# Patient Record
Sex: Male | Born: 1950 | Race: White | Hispanic: No | Marital: Married | Smoking: Former smoker
Health system: Southern US, Community
[De-identification: ages and names within clinical notes are randomized; demographics above are authoritative.]

## PROBLEM LIST (undated history)

## (undated) DIAGNOSIS — F419 Anxiety disorder, unspecified: Secondary | ICD-10-CM

## (undated) DIAGNOSIS — K589 Irritable bowel syndrome without diarrhea: Secondary | ICD-10-CM

## (undated) DIAGNOSIS — I493 Ventricular premature depolarization: Secondary | ICD-10-CM

## (undated) DIAGNOSIS — F32A Depression, unspecified: Secondary | ICD-10-CM

## (undated) DIAGNOSIS — E7800 Pure hypercholesterolemia, unspecified: Secondary | ICD-10-CM

## (undated) DIAGNOSIS — F1911 Other psychoactive substance abuse, in remission: Secondary | ICD-10-CM

## (undated) DIAGNOSIS — I10 Essential (primary) hypertension: Secondary | ICD-10-CM

## (undated) DIAGNOSIS — Z8719 Personal history of other diseases of the digestive system: Secondary | ICD-10-CM

## (undated) DIAGNOSIS — Z87442 Personal history of urinary calculi: Secondary | ICD-10-CM

## (undated) DIAGNOSIS — K219 Gastro-esophageal reflux disease without esophagitis: Secondary | ICD-10-CM

## (undated) DIAGNOSIS — E876 Hypokalemia: Secondary | ICD-10-CM

## (undated) DIAGNOSIS — Z720 Tobacco use: Secondary | ICD-10-CM

## (undated) DIAGNOSIS — N400 Enlarged prostate without lower urinary tract symptoms: Secondary | ICD-10-CM

## (undated) DIAGNOSIS — F329 Major depressive disorder, single episode, unspecified: Secondary | ICD-10-CM

## (undated) DIAGNOSIS — R319 Hematuria, unspecified: Secondary | ICD-10-CM

## (undated) DIAGNOSIS — I2510 Atherosclerotic heart disease of native coronary artery without angina pectoris: Secondary | ICD-10-CM

## (undated) DIAGNOSIS — Z8701 Personal history of pneumonia (recurrent): Secondary | ICD-10-CM

## (undated) DIAGNOSIS — R7303 Prediabetes: Secondary | ICD-10-CM

## (undated) HISTORY — DX: Tobacco use: Z72.0

## (undated) HISTORY — DX: Hematuria, unspecified: R31.9

## (undated) HISTORY — DX: Anxiety disorder, unspecified: F41.9

## (undated) HISTORY — DX: Benign prostatic hyperplasia without lower urinary tract symptoms: N40.0

## (undated) HISTORY — DX: Hypokalemia: E87.6

## (undated) HISTORY — DX: Major depressive disorder, single episode, unspecified: F32.9

## (undated) HISTORY — DX: Depression, unspecified: F32.A

## (undated) HISTORY — PX: CHOLECYSTECTOMY OPEN: SUR202

## (undated) HISTORY — PX: TONSILLECTOMY: SUR1361

## (undated) HISTORY — DX: Ventricular premature depolarization: I49.3

---

## 1999-01-13 ENCOUNTER — Ambulatory Visit (HOSPITAL_COMMUNITY): Admission: RE | Admit: 1999-01-13 | Discharge: 1999-01-13 | Payer: Self-pay | Admitting: Family Medicine

## 1999-01-13 ENCOUNTER — Encounter: Payer: Self-pay | Admitting: Family Medicine

## 2000-12-02 ENCOUNTER — Ambulatory Visit (HOSPITAL_COMMUNITY): Admission: RE | Admit: 2000-12-02 | Discharge: 2000-12-02 | Payer: Self-pay | Admitting: Family Medicine

## 2000-12-02 ENCOUNTER — Encounter: Payer: Self-pay | Admitting: Family Medicine

## 2007-10-02 ENCOUNTER — Observation Stay (HOSPITAL_COMMUNITY): Admission: EM | Admit: 2007-10-02 | Discharge: 2007-10-03 | Payer: Self-pay | Admitting: Emergency Medicine

## 2011-04-10 NOTE — H&P (Signed)
NAMERIHAAN, Joseph Fitzpatrick             ACCOUNT NO.:  [id number]   MEDICAL RECORD NO.:  [id number]          PATIENT TYPE:  OBV   LOCATION:  4735                         FACILITY:  [facility]   PHYSICIAN:  Kela Millin, M.D.DATE OF BIRTH:  Sep 16, 1951   DATE OF ADMISSION:  10/02/2007  DATE OF DISCHARGE:                              HISTORY & PHYSICAL   PRIMARY CARE PHYSICIAN:  Dr. Elias Else   CHIEF COMPLAINT:  Dizziness, chest pain.   HISTORY OF PRESENT ILLNESS:  The patient is a 60 year old white male  with past medical history significant for marijuana abuse,  anxiety/depression and also history of panic attacks, tobacco abuse who  presents with the above complaints.  He states that he has been having  chest discomfort on and off for a long time.  He describes this as a  twinge, of minor severity, left precordial in location, no associated  shortness of breath, no radiation, no diaphoresis, also no associated  nausea or vomiting.  He states that a long time ago he had had similar  symptoms and went to [facility] ER and was diagnosed with indigestion  at that time.  He states that he had been started on Lamictal some time  ago as a mood stabilizer and he noticed right after he started taking it  that he was more aware of his heart beat.  Denies any racing of his  heart.  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  He denies any focal weakness, no slurred speech, no dysphagia.  He also  admits to daily marijuana use for the most part and states that this  dizziness felt a little bit different from the dizziness that he usually  feels after using marijuana.  The patient admits to having had a stress  test done by Dr. Verdis Prime some 10-15 years ago just because his dad  had had angioplasties in his 67s, and at that time his  stress test was  reported to be within normal limits.  The patient denies fevers, cough,  PND, lower extremity edema.  In the ER his initial blood pressure was  noted to be elevated at 169/102 and the diastolic improved on recheck to  172/96, his point of care markers were negative, and EKG showed PACs and  nonspecific lateral T-wave abnormalities.  His urine drug screen was  positive for marijuana.  He is admitted for further evaluation and  management.   PAST MEDICAL HISTORY:  As above.   MEDICATIONS:  1. Xanax 0.5 mg p.o. b.i.d., up to three times a day.  2. Pristiq one tablet daily.  3. He states that his Lamictal was discontinued on Tuesday.   ALLERGIES:  No known drug allergies.   SOCIAL HISTORY:  Positive for tobacco, less than a pack per day.  He  quit alcohol 25 years ago.  Positive for marijuana.   FAMILY HISTORY:  His dad had angioplasties in  his 59s and died at age  52.  His mother had Alzheimer's.  Both parents had hypertension.   REVIEW OF SYSTEMS:  As per HPI, other review of systems negative.   PHYSICAL EXAMINATION:  GENERAL:  The patient is a middle-aged white  male, well-developed, well-nourished, in no apparent distress.  VITAL SIGNS:  His temperature is 99.3, his blood pressure is 172/96,  initially 196/102, his pulse is 78, his respiratory rate is 16, O2  saturation of 94%.  HEENT:  PERRL, EOMI, sclerae anicteric, moist mucous membranes and no  oral exudates.  LUNGS:  Clear to auscultation bilaterally.  No crackles or wheezes.  CARDIOVASCULAR:  Regular rate and rhythm.  Normal S1, S2.  ABDOMEN:  Soft, bowel sounds present, nontender, nondistended.  No  organomegaly and no masses palpable.  EXTREMITIES:  No cyanosis and no edema.  NEUROLOGIC:  He is alert and oriented x3.  Cranial nerves II-XII grossly  intact.  Strength is 5/5 and symmetric.  No facial asymmetry.  Nonfocal  exam.   LABORATORY DATA:  His point-of-care markers negative x1.  EKG as per   HPI.  His white cell count is 6.1, hemoglobin of 14.4, hematocrit 42,  platelet count of 239, neutrophil count 60.  His sodium is 141 with a  potassium of 3.4, chloride is 106, BUN is 7, glucose is 96, pH is 7.40  and his pCO2 is 43.6.  His urine drug screen is positive for marijuana  and benzodiazepines.   ASSESSMENT AND PLAN:  1. Chest pain:  As discussed above, the patient reports that he was      prompted to come to the ER because of dizziness while standing last      p.m..  It is also noted that his blood pressure was markedly      elevated initially and on recheck still uncontrolled.  Although the      patient has no prior history of hypertension, could be a cause of      his chest pain as well.  It is also noted that the patient uses      marijuana almost daily.  Will obtain serial cardiac enzymes,      fasting lipid profile in the a.m., monitor on telemetry.  A D-dimer      has also been ordered.  Will follow and consider further evaluation      pending results.  If above tests negative will consider discharge      for outpatient followup and treating the patient's blood pressures.  2. Uncontrolled hypertension:  As discussed above, no prior      hypertension.  Will start on beta blockers at this time given that      he is having some chest pain as noted above, as well as multiple      PVCs noted on EKG.  3. Hypokalemia:  Replace potassium.  4. Marijuana abuse:  The patient counseled to quit.  Will also consult      social worker for resources to quit.  5. Tobacco abuse:  Counseled to quite, smoking cessation consult.  6. Anxiety/depression:  Continue outpatient medications.      Kela Millin, M.D.  Electronically Signed     ACV/MEDQ  D:  10/02/2007  T:  10/03/2007  Job:  [id number]   cc:   Joseph Fitzpatrick, M.D.

## 2011-09-04 LAB — CARDIAC PANEL(CRET KIN+CKTOT+MB+TROPI)
CK, MB: 1
CK, MB: 1.2
Relative Index: INVALID
Troponin I: 0.02

## 2011-09-04 LAB — I-STAT 8, (EC8 V) (CONVERTED LAB)
Acid-Base Excess: 2
Bicarbonate: 27.5 — ABNORMAL HIGH
Potassium: 3.4 — ABNORMAL LOW
TCO2: 29
pCO2, Ven: 43.6 — ABNORMAL LOW
pH, Ven: 7.408 — ABNORMAL HIGH

## 2011-09-04 LAB — BASIC METABOLIC PANEL
Chloride: 103
GFR calc Af Amer: >60
GFR calc non Af Amer: >60
Potassium: 3.5
Sodium: 139

## 2011-09-04 LAB — RAPID URINE DRUG SCREEN, HOSP PERFORMED
Amphetamines: NOT DETECTED
Benzodiazepines: POSITIVE — AB
Tetrahydrocannabinol: POSITIVE — AB

## 2011-09-04 LAB — POCT CARDIAC MARKERS
CKMB, poc: <1 — ABNORMAL LOW
Myoglobin, poc: 59.7
Operator id: 288831
Troponin i, poc: <0.05

## 2011-09-04 LAB — CBC
HCT: 42
Hemoglobin: 14.4
MCHC: 34.2
MCV: 94.8
Platelets: 239
RBC: 4.43
RDW: 13.2
WBC: 6.1

## 2011-09-04 LAB — DIFFERENTIAL
Basophils Absolute: 0.1
Basophils Relative: 1
Eosinophils Absolute: 0
Eosinophils Relative: 1
Lymphocytes Relative: 32
Lymphs Abs: 2
Monocytes Absolute: 0.4
Monocytes Relative: 7
Neutro Abs: 3.6
Neutrophils Relative %: 60

## 2011-09-04 LAB — LIPID PANEL: Cholesterol: 192

## 2011-09-04 LAB — POCT I-STAT CREATININE
Creatinine, Ser: 0.9
Operator id: 288831

## 2011-09-04 LAB — CK TOTAL AND CKMB (NOT AT ARMC): Relative Index: INVALID

## 2011-09-04 LAB — D-DIMER, QUANTITATIVE: D-Dimer, Quant: 0.22

## 2012-06-17 ENCOUNTER — Ambulatory Visit (INDEPENDENT_AMBULATORY_CARE_PROVIDER_SITE_OTHER): Payer: 59 | Admitting: Surgery

## 2012-07-03 ENCOUNTER — Encounter (INDEPENDENT_AMBULATORY_CARE_PROVIDER_SITE_OTHER): Payer: Self-pay

## 2012-07-09 ENCOUNTER — Encounter (INDEPENDENT_AMBULATORY_CARE_PROVIDER_SITE_OTHER): Payer: Self-pay | Admitting: General Surgery

## 2012-07-09 ENCOUNTER — Ambulatory Visit (INDEPENDENT_AMBULATORY_CARE_PROVIDER_SITE_OTHER): Payer: 59 | Admitting: General Surgery

## 2012-07-09 VITALS — BP 116/84 | HR 58 | Temp 97.2°F | Resp 12 | Ht 70.75 in | Wt 179.6 lb

## 2012-07-09 DIAGNOSIS — L723 Sebaceous cyst: Secondary | ICD-10-CM

## 2012-07-09 NOTE — Patient Instructions (Addendum)
Call [phone] and ask for scheduling 

## 2012-07-09 NOTE — Progress Notes (Signed)
Patient ID: Joseph Fitzpatrick, male   DOB: 1951-02-13, 61 y.o.   MRN: [id number]  Chief Complaint  Patient presents with  . Pre-op Exam    eval seb cyst on back    HPI Joseph Fitzpatrick is a 61 y.o. male.  Chief complaint: Cyst of the back HPI Patient has a several month history of a cyst in his upper mid back. It intermittently drains some material that has an odor. It is become more symptomatic recently. He was seen for his routine physical by Dr. Zachery Dauer. She asked [place] to see him in consultation to consider removal. He also complains of intermittent pain in the area. He's otherwise been in his usual state of health.  Past Medical History  Diagnosis Date  . Hypertension   . Tobacco abuse   . PVCs (premature ventricular contractions)   . Anxiety   . Depression   . BPH (benign prostatic hypertrophy)   . Hypokalemia   . Hematuria   . Substance abuse     Past Surgical History  Procedure Date  . Cholecystectomy     Family History  Problem Relation Age of Onset  . Alzheimer's disease Mother     Social History History  Substance Use Topics  . Smoking status: Current Everyday Smoker -- 0.5 packs/day for .5 years    Types: Cigarettes  . Smokeless tobacco: Not on file  . Alcohol Use: No    No Known Allergies  Current Outpatient Prescriptions  Medication Sig Dispense Refill  . ALPRAZolam (XANAX) 1 MG tablet Take 1 mg by mouth 3 (three) times daily as needed.      . ARIPiprazole (ABILIFY) 5 MG tablet Take 2.5 mg by mouth daily.      Marland Kitchen losartan-hydrochlorothiazide (HYZAAR) 50-12.5 MG per tablet Take 1 tablet by mouth daily.      . metoprolol tartrate (LOPRESSOR) 25 MG tablet Take 25 mg by mouth daily.      Marland Kitchen PARoxetine (PAXIL) 20 MG tablet Take 20 mg by mouth every morning.      . simvastatin (ZOCOR) 40 MG tablet Take 40 mg by mouth every evening.        Review of Systems Review of Systems  Constitutional: Negative for fever, chills and unexpected weight change.  HENT:  Negative for hearing loss, congestion, sore throat, trouble swallowing and voice change.   Eyes: Negative for visual disturbance.  Respiratory: Negative for cough and wheezing.   Cardiovascular: Negative for chest pain, palpitations and leg swelling.  Gastrointestinal: Negative for nausea, vomiting, abdominal pain, diarrhea, constipation, blood in stool, abdominal distention, anal bleeding and rectal pain.  Genitourinary: Negative for hematuria and difficulty urinating.  Musculoskeletal: Negative for arthralgias.       See history of present illness  Skin: Negative for rash and wound.  Neurological: Negative for seizures, syncope, weakness and headaches.  Hematological: Negative for adenopathy. Does not bruise/bleed easily.  Psychiatric/Behavioral: Negative for confusion.       History of anxiety and depression    Blood pressure 116/84, pulse 58, temperature 97.2 F (36.2 C), temperature source Temporal, resp. rate 12, height 5' 10.75" (1.797 m), weight 179 lb 9.6 oz (81.466 kg), SpO2 93.00%.  Physical Exam Physical Exam  Constitutional: He is oriented to person, place, and time. He appears well-developed and well-nourished.  HENT:  Head: Normocephalic and atraumatic.  Mouth/Throat: No oropharyngeal exudate.  Eyes: EOM are normal. Pupils are equal, round, and reactive to light. No scleral icterus.  Neck: Normal range of  motion. Neck supple. No tracheal deviation present.  Cardiovascular: Normal rate, normal heart sounds and intact distal pulses.   Pulmonary/Chest: Effort normal and breath sounds normal. No stridor. No respiratory distress. He has no wheezes. He has no rales.  Abdominal: Soft. He exhibits no distension. There is no tenderness. There is no rebound and no guarding.       Scar right upper quadrant  Musculoskeletal: Normal range of motion.       Arms:      Sebaceous cyst with a scab upper back along the midline  Lymphadenopathy:    He has no cervical adenopathy.    Neurological: He is alert and oriented to person, place, and time.  Skin:       see musculoskeletal  Psychiatric:       Flat affect    Data Reviewed Office notes  Assessment    Sebaceous cyst upper back    Plan    I've offered excision under general anesthesia. Procedure, risks, and benefits were discussed in detail with the patient. I answered his questions. He needs to check his schedule with his wife. He will then call back to schedule. He is agreeable with the plan.       Mohd Clemons E 07/09/2012, 8:52 AM

## 2013-02-19 ENCOUNTER — Emergency Department (HOSPITAL_COMMUNITY): Payer: 59

## 2013-02-19 ENCOUNTER — Inpatient Hospital Stay (HOSPITAL_COMMUNITY)
Admission: EM | Admit: 2013-02-19 | Discharge: 2013-02-21 | DRG: 885 | Disposition: A | Discharge status: Home / Self Care | Payer: 59 | Attending: Internal Medicine | Admitting: Internal Medicine

## 2013-02-19 ENCOUNTER — Inpatient Hospital Stay (HOSPITAL_COMMUNITY): Payer: 59

## 2013-02-19 ENCOUNTER — Encounter (HOSPITAL_COMMUNITY): Payer: Self-pay | Admitting: Emergency Medicine

## 2013-02-19 DIAGNOSIS — N400 Enlarged prostate without lower urinary tract symptoms: Secondary | ICD-10-CM | POA: Diagnosis present

## 2013-02-19 DIAGNOSIS — E860 Dehydration: Secondary | ICD-10-CM | POA: Diagnosis present

## 2013-02-19 DIAGNOSIS — R488 Other symbolic dysfunctions: Secondary | ICD-10-CM | POA: Diagnosis present

## 2013-02-19 DIAGNOSIS — G9341 Metabolic encephalopathy: Secondary | ICD-10-CM

## 2013-02-19 DIAGNOSIS — Z79899 Other long term (current) drug therapy: Secondary | ICD-10-CM

## 2013-02-19 DIAGNOSIS — R9431 Abnormal electrocardiogram [ECG] [EKG]: Secondary | ICD-10-CM

## 2013-02-19 DIAGNOSIS — F329 Major depressive disorder, single episode, unspecified: Secondary | ICD-10-CM | POA: Diagnosis present

## 2013-02-19 DIAGNOSIS — F1210 Cannabis abuse, uncomplicated: Secondary | ICD-10-CM | POA: Diagnosis present

## 2013-02-19 DIAGNOSIS — E876 Hypokalemia: Secondary | ICD-10-CM | POA: Diagnosis present

## 2013-02-19 DIAGNOSIS — I498 Other specified cardiac arrhythmias: Secondary | ICD-10-CM | POA: Diagnosis present

## 2013-02-19 DIAGNOSIS — T443X5A Adverse effect of other parasympatholytics [anticholinergics and antimuscarinics] and spasmolytics, initial encounter: Secondary | ICD-10-CM | POA: Diagnosis present

## 2013-02-19 DIAGNOSIS — G309 Alzheimer's disease, unspecified: Secondary | ICD-10-CM | POA: Diagnosis present

## 2013-02-19 DIAGNOSIS — D72829 Elevated white blood cell count, unspecified: Secondary | ICD-10-CM | POA: Diagnosis present

## 2013-02-19 DIAGNOSIS — L723 Sebaceous cyst: Secondary | ICD-10-CM

## 2013-02-19 DIAGNOSIS — R232 Flushing: Secondary | ICD-10-CM | POA: Diagnosis present

## 2013-02-19 DIAGNOSIS — I10 Essential (primary) hypertension: Secondary | ICD-10-CM | POA: Diagnosis present

## 2013-02-19 DIAGNOSIS — F411 Generalized anxiety disorder: Secondary | ICD-10-CM | POA: Diagnosis present

## 2013-02-19 DIAGNOSIS — I4949 Other premature depolarization: Secondary | ICD-10-CM | POA: Diagnosis present

## 2013-02-19 DIAGNOSIS — L744 Anhidrosis: Secondary | ICD-10-CM | POA: Diagnosis present

## 2013-02-19 DIAGNOSIS — F0280 Dementia in other diseases classified elsewhere without behavioral disturbance: Secondary | ICD-10-CM | POA: Diagnosis present

## 2013-02-19 DIAGNOSIS — F3289 Other specified depressive episodes: Secondary | ICD-10-CM | POA: Diagnosis present

## 2013-02-19 DIAGNOSIS — R339 Retention of urine, unspecified: Secondary | ICD-10-CM | POA: Diagnosis present

## 2013-02-19 DIAGNOSIS — F29 Unspecified psychosis not due to a substance or known physiological condition: Principal | ICD-10-CM | POA: Diagnosis present

## 2013-02-19 DIAGNOSIS — R4182 Altered mental status, unspecified: Secondary | ICD-10-CM

## 2013-02-19 DIAGNOSIS — R000 Tachycardia, unspecified: Secondary | ICD-10-CM

## 2013-02-19 DIAGNOSIS — I2510 Atherosclerotic heart disease of native coronary artery without angina pectoris: Secondary | ICD-10-CM | POA: Diagnosis present

## 2013-02-19 DIAGNOSIS — F17200 Nicotine dependence, unspecified, uncomplicated: Secondary | ICD-10-CM | POA: Diagnosis present

## 2013-02-19 HISTORY — DX: Irritable bowel syndrome, unspecified: K58.9

## 2013-02-19 LAB — COMPREHENSIVE METABOLIC PANEL
AST: 17 U/L (ref 0–37)
Albumin: 4.3 g/dL (ref 3.5–5.2)
Alkaline Phosphatase: 74 U/L (ref 39–117)
BUN: 12 mg/dL (ref 6–23)
CO2: 20 mEq/L (ref 19–32)
Chloride: 96 mEq/L (ref 96–112)
Creatinine, Ser: 0.96 mg/dL (ref 0.50–1.35)
GFR calc non Af Amer: 88 mL/min — ABNORMAL LOW (ref >90)
Potassium: 2.6 mEq/L — CL (ref 3.5–5.1)
Total Bilirubin: 0.4 mg/dL (ref 0.3–1.2)

## 2013-02-19 LAB — URINE MICROSCOPIC-ADD ON

## 2013-02-19 LAB — RAPID URINE DRUG SCREEN, HOSP PERFORMED
Amphetamines: NOT DETECTED
Barbiturates: NOT DETECTED
Benzodiazepines: POSITIVE — AB
Tetrahydrocannabinol: POSITIVE — AB

## 2013-02-19 LAB — CSF CELL COUNT WITH DIFFERENTIAL
RBC Count, CSF: 2 /mm3 — ABNORMAL HIGH
Tube #: 4
WBC, CSF: 1 /mm3 (ref 0–5)

## 2013-02-19 LAB — URINALYSIS, ROUTINE W REFLEX MICROSCOPIC
Nitrite: NEGATIVE
Specific Gravity, Urine: 1.021 (ref 1.005–1.030)
Urobilinogen, UA: 0.2 mg/dL (ref 0.0–1.0)
pH: 5.5 (ref 5.0–8.0)

## 2013-02-19 LAB — CBC
HCT: 42.3 % (ref 39.0–52.0)
MCV: 90.6 fL (ref 78.0–100.0)
Platelets: 242 10*3/uL (ref 150–400)
RBC: 4.67 MIL/uL (ref 4.22–5.81)
WBC: 13.8 10*3/uL — ABNORMAL HIGH (ref 4.0–10.5)

## 2013-02-19 LAB — POTASSIUM: Potassium: 3 mEq/L — ABNORMAL LOW (ref 3.5–5.1)

## 2013-02-19 LAB — GRAM STAIN

## 2013-02-19 LAB — SALICYLATE LEVEL: Salicylate Lvl: <2 mg/dL — ABNORMAL LOW (ref 2.8–20.0)

## 2013-02-19 LAB — PROTIME-INR: Prothrombin Time: 13.5 seconds (ref 11.6–15.2)

## 2013-02-19 LAB — PROTEIN, CSF: Total  Protein, CSF: 58 mg/dL — ABNORMAL HIGH (ref 15–45)

## 2013-02-19 LAB — CK: Total CK: 79 U/L (ref 7–232)

## 2013-02-19 LAB — ACETAMINOPHEN LEVEL: Acetaminophen (Tylenol), Serum: <15 ug/mL (ref 10–30)

## 2013-02-19 IMAGING — CT CT HEAD W/O CM
2 series · 16 of 30 positions shown, 20 images · non-contrast
Comparison: None.

CLINICAL DATA: Altered mental status,  disoriented, confusion

CT HEAD WITHOUT CONTRAST
TECHNIQUE: Contiguous axial images were obtained from the base of
the skull through the vertex without contrast.

[Series 2: head w/o · axial · non-contrast · 0.43mm/px · z∈[-128,-8]mm · 13 of 30 slices shown, 17 images]
[im 3/30  brain]
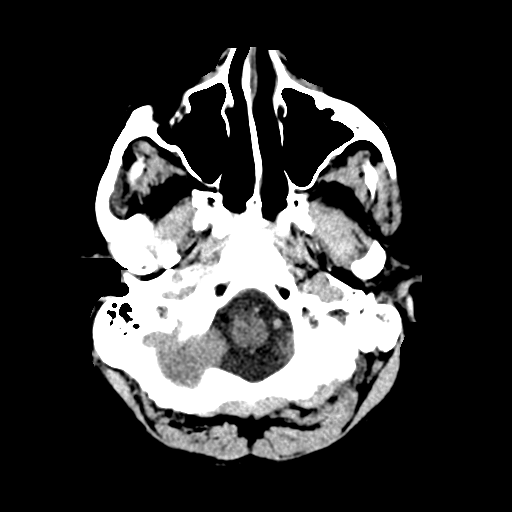
[im 3/30  bone]
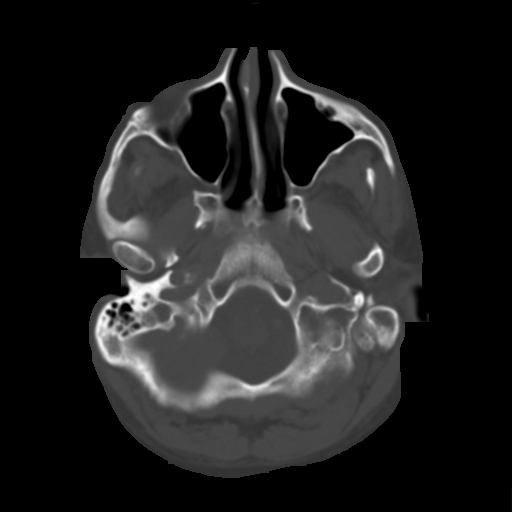
[im 5/30  brain]
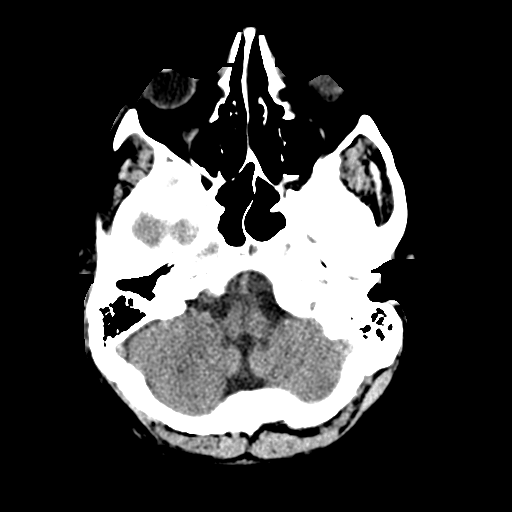
[im 7/30  brain]
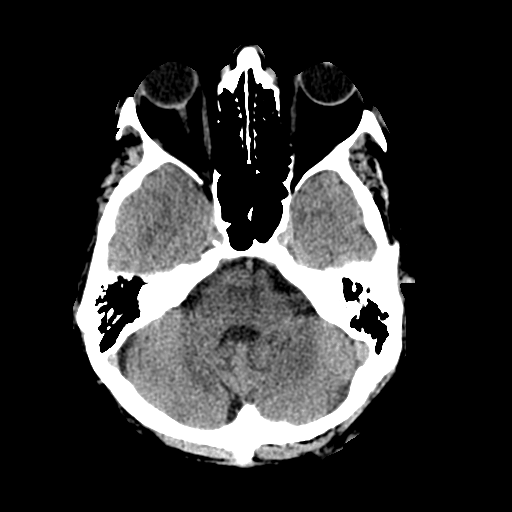
[im 9/30  brain]
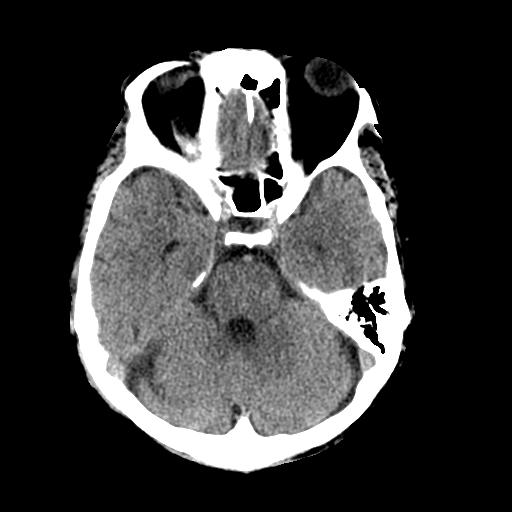
[im 11/30  brain]
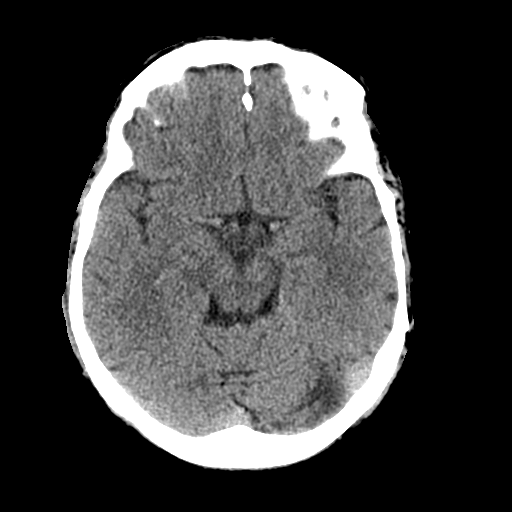
[im 11/30  bone]
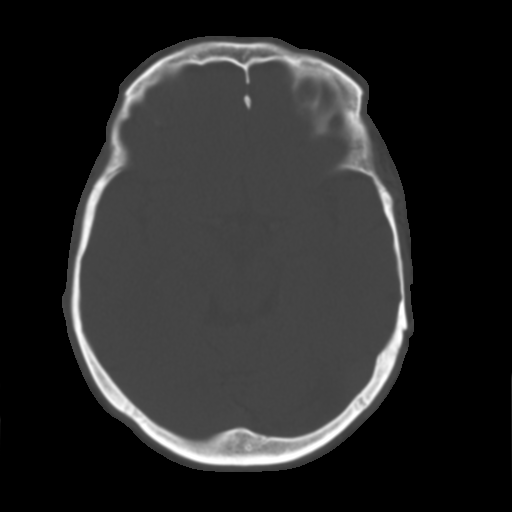
[im 13/30  brain]
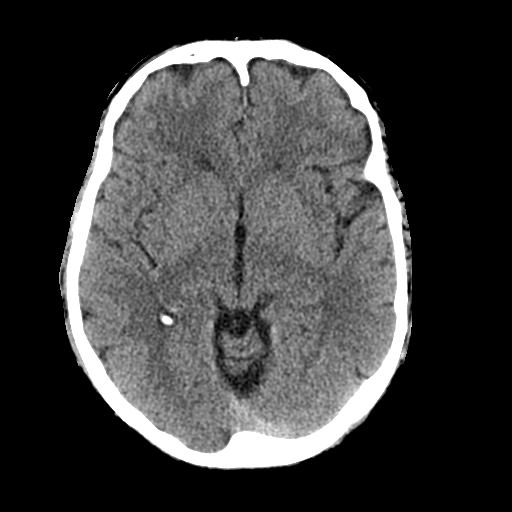
[im 15/30  brain]
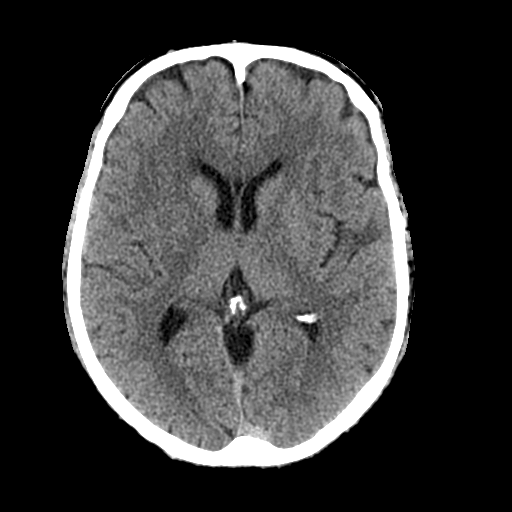
[im 17/30  brain]
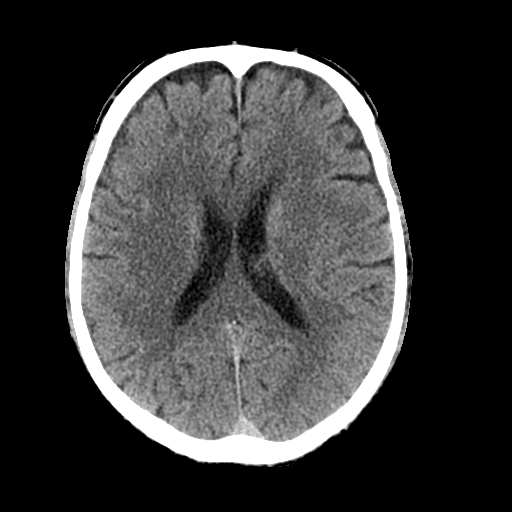
[im 19/30  brain]
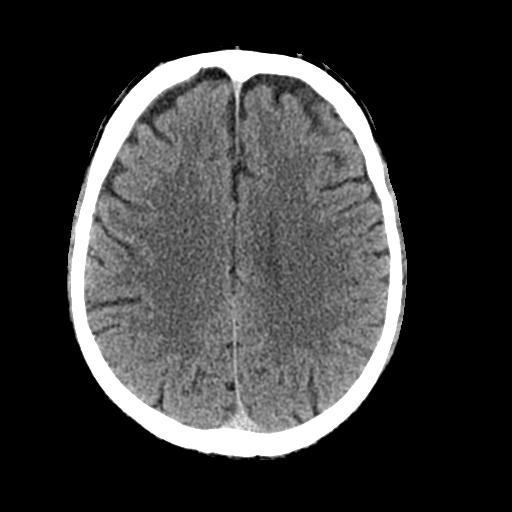
[im 19/30  bone]
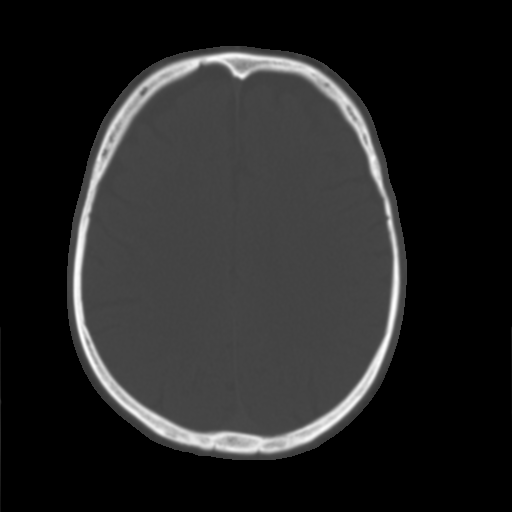
[im 21/30  brain]
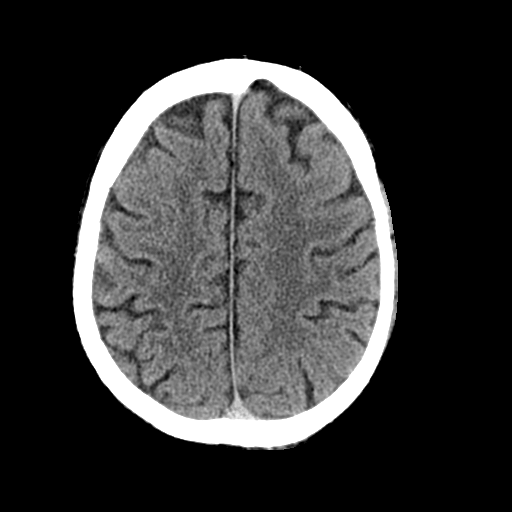
[im 23/30  brain]
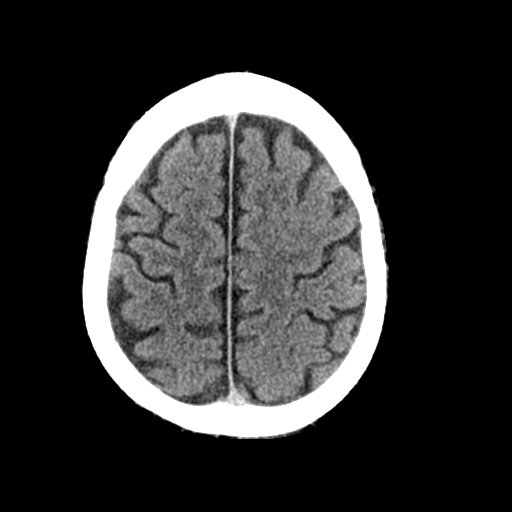
[im 25/30  brain]
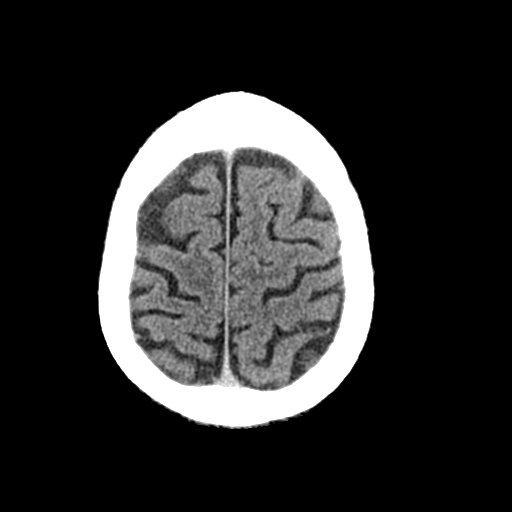
[im 27/30  brain]
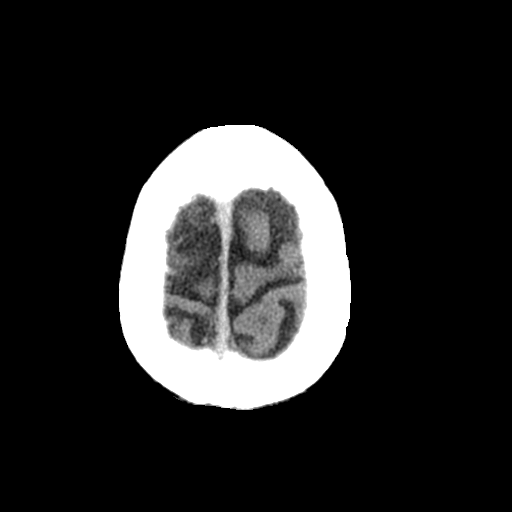
[im 27/30  bone]
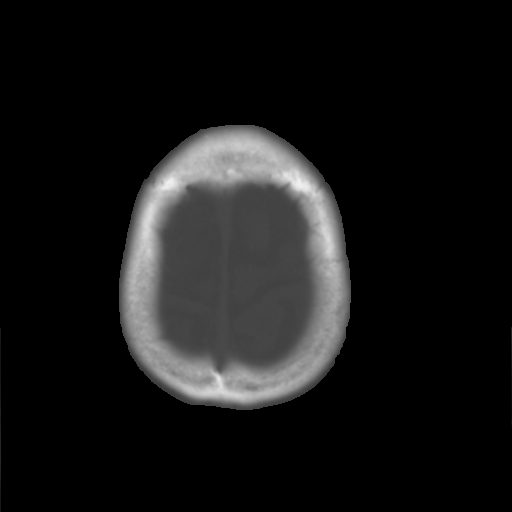

[Series 3: bone windows · axial · 0.43mm/px · z∈[-128,-88]mm · 3 of 30 slices shown]
[im 3/30  bone]
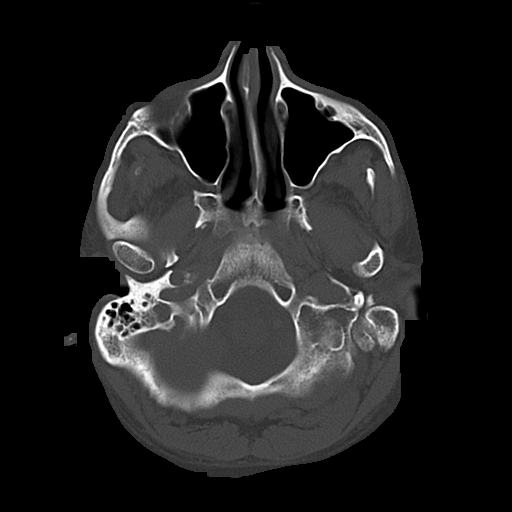
[im 7/30  bone]
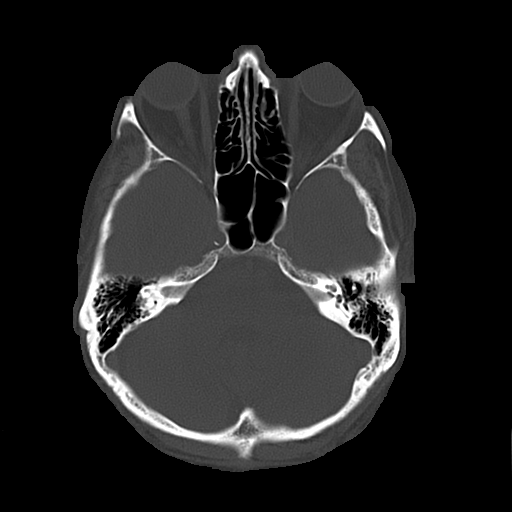
[im 11/30  bone]
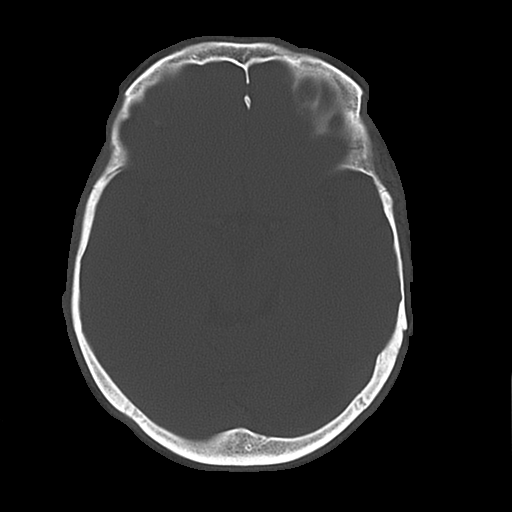

[16 of 30 positions shown; findings below may reference images not displayed]

FINDINGS: The brain stem, cerebellum, cerebral peduncles, thalami,
basal ganglia, basilar cisterns, and ventricular system appear
unremarkable.

No intracranial hemorrhage, mass lesion, or acute infarction is
identified.

The visualized paranasal sinuses appear clear.
IMPRESSION: No significant abnormality identified.

## 2013-02-19 MED ORDER — SODIUM CHLORIDE 0.9 % IJ SOLN
3.0000 mL | Freq: Two times a day (BID) | INTRAMUSCULAR | Status: DC
  Administered 2013-02-20: 3 mL via INTRAVENOUS

## 2013-02-19 MED ORDER — LABETALOL HCL 5 MG/ML IV SOLN
10.0000 mg | INTRAVENOUS | Status: DC | PRN
  Administered 2013-02-19: 10 mg via INTRAVENOUS
  Filled 2013-02-19 (×2): qty 4

## 2013-02-19 MED ORDER — POTASSIUM CHLORIDE IN NACL 20-0.9 MEQ/L-% IV SOLN
INTRAVENOUS | Status: DC
  Administered 2013-02-19 – 2013-02-21 (×3): via INTRAVENOUS
  Filled 2013-02-19 (×5): qty 1000

## 2013-02-19 MED ORDER — LIDOCAINE HCL 1 % IJ SOLN
INTRAMUSCULAR | Status: AC
  Filled 2013-02-19: qty 20

## 2013-02-19 MED ORDER — ACETAMINOPHEN 325 MG PO TABS: 650.0000 mg | ORAL_TABLET | Freq: Four times a day (QID) | ORAL | Status: DC | PRN

## 2013-02-19 MED ORDER — LOSARTAN POTASSIUM 50 MG PO TABS
50.0000 mg | ORAL_TABLET | Freq: Every day | ORAL | Status: DC
  Administered 2013-02-19 – 2013-02-21 (×3): 50 mg via ORAL
  Filled 2013-02-19 (×3): qty 1

## 2013-02-19 MED ORDER — SENNOSIDES-DOCUSATE SODIUM 8.6-50 MG PO TABS
2.0000 | ORAL_TABLET | Freq: Every evening | ORAL | Status: DC | PRN
  Filled 2013-02-19: qty 2

## 2013-02-19 MED ORDER — ACETAMINOPHEN 650 MG RE SUPP: 650.0000 mg | Freq: Four times a day (QID) | RECTAL | Status: DC | PRN

## 2013-02-19 MED ORDER — POTASSIUM CHLORIDE IN NACL 20-0.9 MEQ/L-% IV SOLN
Freq: Once | INTRAVENOUS | Status: AC
  Administered 2013-02-19: 18:00:00 via INTRAVENOUS
  Filled 2013-02-19: qty 1000

## 2013-02-19 MED ORDER — SORBITOL 70 % SOLN
30.0000 mL | Freq: Every day | Status: DC | PRN
  Filled 2013-02-19: qty 30

## 2013-02-19 MED ORDER — ALPRAZOLAM 1 MG PO TABS: 1.0000 mg | ORAL_TABLET | Freq: Three times a day (TID) | ORAL | Status: DC | PRN

## 2013-02-19 MED ORDER — POTASSIUM CHLORIDE CRYS ER 20 MEQ PO TBCR
40.0000 meq | EXTENDED_RELEASE_TABLET | Freq: Once | ORAL | Status: AC
  Administered 2013-02-19: 40 meq via ORAL
  Filled 2013-02-19: qty 2

## 2013-02-19 MED ORDER — METOPROLOL TARTRATE 25 MG PO TABS
25.0000 mg | ORAL_TABLET | Freq: Every day | ORAL | Status: DC
  Administered 2013-02-19 – 2013-02-20 (×2): 25 mg via ORAL
  Filled 2013-02-19 (×2): qty 1

## 2013-02-19 MED ORDER — ENOXAPARIN SODIUM 40 MG/0.4ML ~~LOC~~ SOLN
40.0000 mg | SUBCUTANEOUS | Status: DC
  Administered 2013-02-19 – 2013-02-20 (×2): 40 mg via SUBCUTANEOUS
  Filled 2013-02-19 (×3): qty 0.4

## 2013-02-19 MED ORDER — LORAZEPAM 2 MG/ML IJ SOLN
2.0000 mg | INTRAMUSCULAR | Status: DC | PRN
  Administered 2013-02-19: 2 mg via INTRAVENOUS
  Filled 2013-02-19: qty 1

## 2013-02-19 NOTE — ED Notes (Signed)
Wife reports that pt stated acting strange this morning and started becoming confused. Pt is repetitive at this time and disoriented to self, place, and time. Pt not answering any of the questions that I ask him, but is repeating everything that I am saying at this time.

## 2013-02-19 NOTE — ED Notes (Signed)
Talked with posion control and they want [place] to monitor pts tempature for elevation and treat, cardiac monitor, tx with benzo as needed. They want to monitor and to consider Neuroleptic syndrome if ridgity occurs.

## 2013-02-19 NOTE — ED Provider Notes (Addendum)
History     CSN: [id number]  Arrival date & time 02/19/13  1256   First MD Initiated Contact with Patient 02/19/13 1319      Chief Complaint  Patient presents with  . Medical Clearance    (Consider location/radiation/quality/duration/timing/severity/associated sxs/prior treatment) HPI Comments: Patient comes to the ER for evaluation of mental status changes. Patient reportedly called his significant other this morning at 5:30 AM and was behaving very confused. She found him having difficulty with concentration and acting very bizarrely. This is very unusual for the patient. He does have a history of depression, but is currently being treated. Patient is not actively suicidal at this time, but is having difficulty answering questions.  Level V caveat applies because the patient is confused and cannot answer questions.   Past Medical History  Diagnosis Date  . Hypertension   . Tobacco abuse   . PVCs (premature ventricular contractions)   . Anxiety   . Depression   . BPH (benign prostatic hypertrophy)   . Hypokalemia   . Hematuria   . Substance abuse     Past Surgical History  Procedure Laterality Date  . Cholecystectomy      Family History  Problem Relation Age of Onset  . Alzheimer's disease Mother     History  Substance Use Topics  . Smoking status: Current Every Day Smoker -- 0.50 packs/day for .5 years    Types: Cigarettes  . Smokeless tobacco: Not on file  . Alcohol Use: No      Review of Systems  Unable to perform ROS   Allergies  Review of patient's allergies indicates no known allergies.  Home Medications   Current Outpatient Rx  Name  Route  Sig  Dispense  Refill  . ALPRAZolam (XANAX) 1 MG tablet   Oral   Take 1 mg by mouth 3 (three) times daily as needed.         . ARIPiprazole (ABILIFY) 5 MG tablet   Oral   Take 2.5-5 mg by mouth daily.          . DULoxetine (CYMBALTA) 30 MG capsule   Oral   Take 90 mg by mouth every morning.          . gabapentin (NEURONTIN) 300 MG capsule   Oral   Take 300 mg by mouth at bedtime.         Marland Kitchen losartan-hydrochlorothiazide (HYZAAR) 50-12.5 MG per tablet   Oral   Take 1 tablet by mouth daily.         . metoprolol tartrate (LOPRESSOR) 25 MG tablet   Oral   Take 25 mg by mouth daily.         Marland Kitchen PARoxetine (PAXIL) 40 MG tablet   Oral   Take 40 mg by mouth every morning.         . simvastatin (ZOCOR) 40 MG tablet   Oral   Take 40 mg by mouth every evening.           BP 151/96  Pulse 112  Temp(Src) 98.6 F (37 C) (Oral)  Resp 24  SpO2 97%  Physical Exam  Constitutional: He appears well-developed and well-nourished. No distress.  HENT:  Head: Normocephalic and atraumatic.  Right Ear: Hearing normal.  Nose: Nose normal.  Mouth/Throat: Oropharynx is clear and moist and mucous membranes are normal.  Eyes: Conjunctivae and EOM are normal. Pupils are equal, round, and reactive to light.  Neck: Normal range of motion. Neck supple.  Cardiovascular: Normal  rate, regular rhythm, S1 normal and S2 normal.  Exam reveals no gallop and no friction rub.   No murmur heard. Pulmonary/Chest: Effort normal and breath sounds normal. No respiratory distress. He exhibits no tenderness.  Abdominal: Soft. Normal appearance and bowel sounds are normal. There is no hepatosplenomegaly. There is no tenderness. There is no rebound, no guarding, no tenderness at McBurney's point and negative Murphy's sign. No hernia.  Musculoskeletal: Normal range of motion.  Neurological: He is alert. He has normal strength. He is disoriented. No cranial nerve deficit or sensory deficit. Coordination normal. GCS eye subscore is 4. GCS verbal subscore is 4. GCS motor subscore is 6.  Reflex Scores:      Patellar reflexes are 4+ on the right side and 4+ on the left side. +/- equivocal exam for clonus  No significant muscle rigidity  Skin: Skin is warm, dry and intact. No rash noted. No cyanosis.   Psychiatric: He has a normal mood and affect. His speech is normal and behavior is normal. Thought content normal.    ED Course  Procedures (including critical care time)  EKG:  Date: 02/19/2013  Rate: 131  Rhythm: normal sinus rhythm  QRS Axis: normal  Intervals: normal  ST/T Wave abnormalities: rate-related changes - lateral ST depressions . No obvious infarct  Conduction Disutrbances:none  Narrative Interpretation:   Old EKG Reviewed: lateral ST segment depression and tachycardia are new    Labs Reviewed  CBC - Abnormal; Notable for the following:    WBC 13.8 (*)    All other components within normal limits  COMPREHENSIVE METABOLIC PANEL - Abnormal; Notable for the following:    Potassium 2.6 (*)    Glucose, Bld 152 (*)    Total Protein 8.4 (*)    GFR calc non Af Amer 88 (*)    All other components within normal limits  SALICYLATE LEVEL - Abnormal; Notable for the following:    Salicylate Lvl <2.0 (*)    All other components within normal limits  ACETAMINOPHEN LEVEL  ETHANOL  CK  PROTIME-INR  AMMONIA  URINE RAPID DRUG SCREEN (HOSP PERFORMED)  URINALYSIS, ROUTINE W REFLEX MICROSCOPIC   Ct Head Wo Contrast  02/19/2013  *RADIOLOGY REPORT*  Clinical Data: Altered mental status,  disoriented, confusion  CT HEAD WITHOUT CONTRAST  Technique:  Contiguous axial images were obtained from the base of the skull through the vertex without contrast.  Comparison: None.  Findings: The brain stem, cerebellum, cerebral peduncles, thalami, basal ganglia, basilar cisterns, and ventricular system appear unremarkable.  No intracranial hemorrhage, mass lesion, or acute infarction is identified.  The visualized paranasal sinuses appear clear.  IMPRESSION:  No significant abnormality identified.   Original Report Authenticated By: Gaylyn Rong, M.D.      Diagnosis: Mental status changes, possible serotonin syndrome    MDM  Patient presents to ER with complaints of significant  mental status changes. Patient's behavior is very hot. He has difficulty concentrating and answering questions. He can follow commands, however. Patient makes inappropriate statements, has erythema along with himself in the room. He is not in any way lethargic,or exhibiting any decreased alertness.  Patient is tachycardic and confused. Pupils are inappropriately dilated. This raises concern for toxidrome. Anticholinergic toxicity is considered, but patient apparently does not have any medications in his history or at home that would likely cause this. Significant other confirms her an over-the-counter medications in addition to his home meds. Patient does have a bag which contains Paxil as well as Cymbalta.  It's not clear if he is taking both of these are inadvertently took both of these. This raises concern for serotonin syndrome. Patient does have many symptoms that are consistent with serotonin syndrome, but he does not have any significant clonus or muscle rigidity.  Because it's not clear what is going on with the patient, it was felt that lumbar puncture was important to perform. Risk and benefits were discussed with the patient and genetic and other, consent was signed. Procedure was performed without difficulty and there were no complications. Please see separate procedure note. CSF studies are pending at this time.  The patient is exhibiting significant mental status changes with the above-mentioned clinical findings. Although he does have a history of depression, I cannot completely explain his current presentation by psychiatric diagnosis. He'll therefore require hospitalization on a general medical service.  Gilda Crease, MD 02/19/13 1558  Gilda Crease, MD 02/19/13 1604  Gilda Crease, MD 02/19/13 [phone]

## 2013-02-19 NOTE — Progress Notes (Signed)
Poison control given update on pt status. 

## 2013-02-19 NOTE — ED Provider Notes (Signed)
Medical screening examination/treatment/procedure(s) were conducted as a shared visit with non-physician practitioner(s) and myself.  I personally evaluated the patient during the encounter.  I was present for the procedure and observed the entire procedure performed PA.  Gilda Crease, MD 02/19/13 [phone]

## 2013-02-19 NOTE — H&P (Addendum)
[facility] History and Physical  Joseph Fitzpatrick WUJ:[id number] DOB: 29-Jul-1951 DOA: 02/19/2013  Referring physician:  Dr. Blinda Leatherwood PCP:  Gaye Alken, MD   Chief Complaint:  Abnormal behavior  HPI:  The patient is a 62 y.o. year-old male with history of HTN, BPH, distant history of substance abuse, depression and anxiety on olanzapine, paroxetine, and cymbalta, who presents with echolalia and abnormal speech.  The patient was last at their baseline health yesterday evening before going to bed.  He is at baseline, per wife, normal in behavior and speech.  This morning, he started emailing her with one word emails with her name and other random Joseph Fitzpatrick words.  He also started calling her on her cell phone repeatedly while she was in the house and when she picked up the phone, he was unable to speak to her.  He was able to communicate with difficulty with Joseph Fitzpatrick sentences and yes/no answers.  He denies focal weakness, numbness.  Wife denies changes to facial appearance and concurs with lack of focal weakness.  He endorses some blurry vision, feeling flushed or hot, and having muscle twitches, particularly around his eyes, his right arm, and bilateral legs.  Denies neck stiffness, but endorses headache.  He repeatedly denied recent use of benadryl, OTC allergy or cold medications, accidental or intentional overdose of his prescription medications.  Denies recent changes to his medications and doses of medications.  Denies illicit drug use, including recent THC, cocaine, bath salts, molly, etc.    In the ER, his vital signs were notable for temperature 99.40F, tachycardia to the 130s, mildly elevated blood pressure to 162/101.  Labs demonstrated WBC 13.8 and potassium of 2.6, but were otherwise normal.  Head CT was negative.  He underwent LP, results of which are pending.  Poison control was notified who recommended close observation for elevations in temperature, heart rhythm problems, and  muscle rigidity given his concurrent use of paroxetine and cymbalta.  In the ER, he had urinary retention with of retained urine so foley was placed.  I spoke with Dr. Leroy Kennedy from Neurology to assess the patient given his unusual constellation of symptoms.    tpa not administed as NIH stroke scale = 0  Review of Systems:  Denies fevers, chills, weight loss or gain, changes to hearing.  Denies rhinorrhea, sinus congestion, sore throat.  Denies chest pain and palpitations.  Denies SOB, wheezing, cough.  Denies nausea, vomiting, constipation.  Chronic diarrhea is stable.  Denies dysuria, frequency, urgency, polyuria, polydipsia but has had difficulty urinating today.  Denies hematemesis, blood in stools, melena, abnormal bruising or bleeding.  Denies lymphadenopathy.  Denies arthralgias, myalgias.  Denies skin rash or ulcer.  Denies lower extremity edema.  Denies focal numbness, weakness, facial droop.  Baseline anxiety and depression.    Past Medical History  Diagnosis Date  . Hypertension   . Tobacco abuse   . PVCs (premature ventricular contractions)   . Anxiety   . Depression   . BPH (benign prostatic hypertrophy)   . Hypokalemia   . Hematuria   . Substance abuse   . IBS (irritable bowel syndrome)    Past Surgical History  Procedure Laterality Date  . Cholecystectomy     Social History:  reports that he has been smoking Cigarettes.  He started smoking about 41 years ago. He has a 50 pack-year smoking history. He has never used smokeless tobacco. He reports that he uses illicit drugs (Marijuana). He reports that he does not drink  alcohol.  Lives with his wife and 32 year old son.  Normally ambulates without assistive device, drives, completes ADLs without difficulty.    No Known Allergies  Family History  Problem Relation Age of Onset  . Alzheimer's disease Mother   . CAD Father   . Depression Father   . Anxiety disorder Mother   . High blood pressure Mother      Prior to  Admission medications   Medication Sig Start Date End Date Taking? Authorizing Provider  ALPRAZolam Prudy Feeler) 1 MG tablet Take 1 mg by mouth 3 (three) times daily as needed.   Yes Historical Provider, MD  ARIPiprazole (ABILIFY) 5 MG tablet Take 2.5-5 mg by mouth daily.    Yes Historical Provider, MD  DULoxetine (CYMBALTA) 30 MG capsule Take 90 mg by mouth every morning.   Yes Historical Provider, MD  gabapentin (NEURONTIN) 300 MG capsule Take 300 mg by mouth at bedtime.   Yes Historical Provider, MD  losartan-hydrochlorothiazide (HYZAAR) 50-12.5 MG per tablet Take 1 tablet by mouth daily.   Yes Historical Provider, MD  metoprolol tartrate (LOPRESSOR) 25 MG tablet Take 25 mg by mouth daily.   Yes Historical Provider, MD  PARoxetine (PAXIL) 40 MG tablet Take 40 mg by mouth every morning.   Yes Historical Provider, MD  simvastatin (ZOCOR) 40 MG tablet Take 40 mg by mouth every evening.   Yes Historical Provider, MD   Physical Exam: Filed Vitals:   02/19/13 1415 02/19/13 1511 02/19/13 1600 02/19/13 1800  BP:  151/96 149/97 188/107  Pulse:  112 108 141  Temp: 99.4 F (37.4 C) 98.6 F (37 C)  99.5 F (37.5 C)  TempSrc: Oral Oral    Resp:   18 23  Height:    5\' 10"  (1.778 m)  Weight:    91 kg (200 lb 9.9 oz)  SpO2:  97% 97% 98%     General:  Caucasian male, no acute distress, lying on stretcher, awake, alert, interactive  Eyes:  Pupils dilated to 4-44mm and react minimally to light and accomodation, anicteric, non-injected.  Twitches of muscles under eyes bilaterally  ENT:  Nares clear.  OP clear, non-erythematous without plaques or exudates.  MMM.  Mild tongue tremor  Neck:  Supple without TM or JVD.    Lymph:  No cervical, supraclavicular, or submandibular LAD.  Cardiovascular:  Tachycardic, regular rhythm, normal S1, S2, without m/r/g.  2+ pulses, warm extremities  Respiratory:  CTA bilaterally without increased WOB.  Abdomen:  NABS.  Soft, ND/NT.  Palpable full bladder (foley  inserted afterwards)  Skin:  No rashes or focal lesions, but skin on face and neck is flushed without diaphoresis.  Feels warm to touch throughout  Musculoskeletal:  Normal bulk and tone.  No LE edema.  Psychiatric:  A & O x 1.  Flat vocal affect, minimal facial expressions.  Echolalia, difficulty forming sentences without severe effort, however, used words like "breast tumor" and "biopsy" when referring to his mother.  Mostly responded with "yes" "no" answers.    Neurologic:  CN 3-12 grossly intact, except for dilated pupils that react minimally.  5/5 strength.  Sensation intact.  Brisk reflexes.  Difficulty relaxing to test for clonus as patient had difficulty relaxing for exam.  Muscles would twitch frequently in arms during exam, but not bilaterally simultaneously.    Labs on Admission:  Basic Metabolic Panel:  Recent Labs Lab 02/19/13 1335  NA 135  K 2.6*  CL 96  CO2 20  GLUCOSE 152*  BUN 12  CREATININE 0.96  CALCIUM 9.8   Liver Function Tests:  Recent Labs Lab 02/19/13 1335  AST 17  ALT 25  ALKPHOS 74  BILITOT 0.4  PROT 8.4*  ALBUMIN 4.3   No results found for this basename: LIPASE, AMYLASE,  in the last 168 hours  Recent Labs Lab 02/19/13 1349  AMMONIA 33   CBC:  Recent Labs Lab 02/19/13 1335  WBC 13.8*  HGB 14.7  HCT 42.3  MCV 90.6  PLT 242   Cardiac Enzymes:  Recent Labs Lab 02/19/13 1334  CKTOTAL 79    BNP (last 3 results) No results found for this basename: PROBNP,  in the last 8760 hours CBG: No results found for this basename: GLUCAP,  in the last 168 hours  Radiological Exams on Admission: Ct Head Wo Contrast  02/19/2013  *RADIOLOGY REPORT*  Clinical Data: Altered mental status,  disoriented, confusion  CT HEAD WITHOUT CONTRAST  Technique:  Contiguous axial images were obtained from the base of the skull through the vertex without contrast.  Comparison: None.  Findings: The brain stem, cerebellum, cerebral peduncles, thalami, basal  ganglia, basilar cisterns, and ventricular system appear unremarkable.  No intracranial hemorrhage, mass lesion, or acute infarction is identified.  The visualized paranasal sinuses appear clear.  IMPRESSION:  No significant abnormality identified.   Original Report Authenticated By: Gaylyn Rong, M.D.     EKG: Independently reviewed.  Sinus tachycardia with ST segment depressions in inferolateral leads (II, III, aVF, V3-V6, and I.  Flattened in aVL.  1mm ST segment elevation in V1-V2.    Assessment/Plan Active Problems:   Nonspecific abnormal electrocardiogram (ECG) (EKG)   Urinary retention   Echolalia   Encephalopathy, metabolic   Sinus tachycardia   Confusion, abnormal behavior:  Meningitis less likely given lack of nuchal rigidity, fever, and patient is clinically well appearing.  Encephalitis is also possible, but less likely given the other findings on physical exam that suggest drug toxicity.  Drug toxicity possible.  His exam suggests anticholinergic toxicity with flushing, anhidrosis, dilated pupils, urinary retention, tachycardia, and confusion.  ACh toxicity can be induced by olanzapine, which he takes regularly, however, he is only on 2.5mg  daily.  He denies use of other anticholinergic medications.  He may also be suffering early signs of SSRI overdose or NMS.   -  Admit to stepdown  -  Telemetry -  Foley temperature probe -  Will await cell counts from CSF:  Protein 58 but glucose 84 and WBC 1, RBC 2.  Inconsistent with meningitis.   -  Neurology consultation -  Ativan as needed for agitation and sedation -  Consider esmolol gtt if needed if blood pressure escalates and does not respond to benzodiazepines -  May use ice/cooling blankets to keep temperature wnl -  Consider MRI, but will await Neurology consultation before ordering (also, would need foley temperature probe removed) -  Hold olanzapine, paroxetine, cymbalta -  Follow up UDS:  Benzo and THC positive -  TSH,  RPR, B12  Leukocytosis, mild -  CXR pending -  UA pending -  Trend WBC  Hypokalemia, may be due to HCTZ.  - Hold diuretic - Oral and IV potassium.  Repeat level in AM - Add on magnesium  Abnl ECG, reviewed by cardiology -  Cycle troponins -  Repeat ECG once heart rate nearer 100bpm  Sinus tachycardia, improved in ER with IVF -  F/u TSH -  Continue IVF -  Benzos as needed -  Consider esmolol gtt if persistently tachycardic to 130s or higher  Elevated BP, trended down in ER -  As above  Urinary retention,  -  Voiding trial tomorrow if improved -  Continue foley  IBS, stable -  Monitor for diarrhea  Possibly globulin gap -  Repeat CMP in AM and if persistent despite hydration, recommend HIV and HCV.    Diet:  Healthy heart Access:  PIV IVF:  NS with 20kcl at 169ml/h  Proph:  lovenox  Code Status: full code Family Communication: spoke with patient and his wife, Harriett Sine, [phone] Disposition Plan: admit to stepdown  Time spent: 75 min  Uchechukwu Dhawan [facility] Pager [phone]  If 7PM-7AM, please contact night-coverage [email or web address] Password [facility] 02/19/2013, 6:39 PM

## 2013-02-19 NOTE — Progress Notes (Signed)
Pt down for MRI.

## 2013-02-19 NOTE — ED Notes (Signed)
Patient transported to CT 

## 2013-02-19 NOTE — Consult Note (Signed)
NEURO HOSPITALIST CONSULT NOTE    Reason for Consult:altered mental status.  HPI:                                                                                                                                          Joseph Fitzpatrick is an 62 y.o. male with a past medical history significant for hypertension, depression, anxiety, substance abuse, BPH, smoker, brought to [facility] due to altered mental status and abnormal speech. Please, note that patient's altered mentation doesn't allow him to provide clinical history and family is not available to gather further history, and therefore all clinical information is obtained from patient's records. He has been on olanzapine, paroxetine, and cymbalta for his underlying mood disorder but it seems to be unclear whether or not he is taking all medications together. In any case, he was in his usual state of heath until this morning when his wife noted that he was acting in a bizarre way which apparently never occurred before. There is a report that he was emailing her with one word emails with her name and other random short words. He also started calling her on her cell phone repeatedly while she was in the house and when she picked up the phone, he was unable to speak to her. He was able to communicate with difficulty with short sentences and yes/no answers. He was brought to [facility] where he had serological testing, urine toxic screen, CT brain, and spinal tap that were unrevealing. Upon arrival to the ED he was found to be tachycardic, with mildly elevated BP 162/101. Initial assessment also revelead dilated pupils with generalized hyperreflexia without clonus and thus a concerned for serotonin syndrome was raised. At this moment, he has prominent echolalia but is able to follow commands consistently.      Past Medical History  Diagnosis Date  . Hypertension   . Tobacco abuse   . PVCs (premature ventricular contractions)   .  Anxiety   . Depression   . BPH (benign prostatic hypertrophy)   . Hypokalemia   . Hematuria   . Substance abuse   . IBS (irritable bowel syndrome)     Past Surgical History  Procedure Laterality Date  . Cholecystectomy      Family History  Problem Relation Age of Onset  . Alzheimer's disease Mother   . CAD Father   . Depression Father   . Anxiety disorder Mother   . High blood pressure Mother     Social History:  reports that he has been smoking Cigarettes.  He started smoking about 41 years ago. He has a 50 pack-year smoking history. He has never used smokeless tobacco. He reports that he uses illicit drugs (Marijuana). He reports that he does not drink alcohol.  No  Known Allergies  MEDICATIONS:                                                                                                                     I have reviewed the patient's current medications.   ROS:                                                                                                                                       History obtained from chart review     Physical exam: pleasant male in no apparent distress. Blood pressure 197/116, pulse 112, temperature 98.4 F (36.9 C), temperature source Core (Comment), resp. rate 18, height 5\' 10"  (1.778 m), weight 91 kg (200 lb 9.9 oz), SpO2 96.00%. Head: normocephalic. Neck: supple, no bruits, no JVD. Cardiac: no murmurs. Lungs: clear. Abdomen: soft, no tender, no mass. Extremities: no edema.     Neurologic Examination:                                                                                                      Mental Status: Alert, awake. Striking echolalia but no dysphasia or dysarthria.  Able to follow 3 step commands without difficulty. Cranial Nerves: II: Discs flat bilaterally; Visual fields grossly normal, pupils 5-6 mm equal, round, reactive to light and accommodation III,IV, VI: ptosis not present, extra-ocular motions intact  bilaterally V,VII: smile symmetric, facial light touch sensation normal bilaterally VIII: hearing normal bilaterally IX,X: gag reflex present XI: bilateral shoulder shrug XII: midline tongue extension Motor: Right : Upper extremity   5/5    Left:     Upper extremity   5/5  Lower extremity   5/5     Lower extremity   5/5 There is increased muscle tone bilateral lower extremities. Sensory: Pinprick and light touch intact throughout, bilaterally Deep Tendon Reflexes: generalized hyperreflexia without clonus. Plantars: Right: downgoing   Left: downgoing Cerebellar: normal finger-to-nose,  normal heel-to-shin test Gait: no tested. CV: pulses palpable throughout  Lab Results  Component Value Date/Time   CHOL  Value: 192        ATP III CLASSIFICATION:  <200     mg/dL   Desirable  403-474  mg/dL   Borderline High  >=259    mg/dL   High 56/01/8755  4:33 AM    Results for orders placed during the hospital encounter of 02/19/13 (from the past 48 hour(s))  CK     Status: None   Collection Time    02/19/13  1:34 PM      Result Value Range   Total CK 79  7 - 232 U/L  PROTIME-INR     Status: None   Collection Time    02/19/13  1:34 PM      Result Value Range   Prothrombin Time 13.5  11.6 - 15.2 seconds   INR 1.04  0.00 - 1.49  ACETAMINOPHEN LEVEL     Status: None   Collection Time    02/19/13  1:35 PM      Result Value Range   Acetaminophen (Tylenol), Serum <15.0  10 - 30 ug/mL   Comment:            THERAPEUTIC CONCENTRATIONS VARY     SIGNIFICANTLY. A RANGE OF 10-30     ug/mL MAY BE AN EFFECTIVE     CONCENTRATION FOR MANY PATIENTS.     HOWEVER, SOME ARE BEST TREATED     AT CONCENTRATIONS OUTSIDE THIS     RANGE.     ACETAMINOPHEN CONCENTRATIONS     >150 ug/mL AT 4 HOURS AFTER     INGESTION AND >50 ug/mL AT 12     HOURS AFTER INGESTION ARE     OFTEN ASSOCIATED WITH TOXIC     REACTIONS.  CBC     Status: Abnormal   Collection Time    02/19/13  1:35 PM      Result Value  Range   WBC 13.8 (*) 4.0 - 10.5 K/uL   RBC 4.67  4.22 - 5.81 MIL/uL   Hemoglobin 14.7  13.0 - 17.0 g/dL   HCT 29.5  18.8 - 41.6 %   MCV 90.6  78.0 - 100.0 fL   MCH 31.5  26.0 - 34.0 pg   MCHC 34.8  30.0 - 36.0 g/dL   RDW 60.6  30.1 - 60.1 %   Platelets 242  150 - 400 K/uL  COMPREHENSIVE METABOLIC PANEL     Status: Abnormal   Collection Time    02/19/13  1:35 PM      Result Value Range   Sodium 135  135 - 145 mEq/L   Potassium 2.6 (*) 3.5 - 5.1 mEq/L   Comment: CRITICAL RESULT CALLED TO, READ BACK BY AND VERIFIED WITH:     HOGDIN,K. RN AT 1445 02/19/13 BARFIELD,T   Chloride 96  96 - 112 mEq/L   CO2 20  19 - 32 mEq/L   Glucose, Bld 152 (*) 70 - 99 mg/dL   BUN 12  6 - 23 mg/dL   Creatinine, Ser 0.93  0.50 - 1.35 mg/dL   Calcium 9.8  8.4 - 23.5 mg/dL   Total Protein 8.4 (*) 6.0 - 8.3 g/dL   Albumin 4.3  3.5 - 5.2 g/dL   AST 17  0 - 37 U/L   ALT 25  0 - 53 U/L   Alkaline Phosphatase 74  39 - 117 U/L   Total Bilirubin 0.4  0.3 - 1.2 mg/dL   GFR calc  non Af Amer 88 (*) >90 mL/min   GFR calc Af Amer >90  >90 mL/min   Comment:            The eGFR has been calculated     using the CKD EPI equation.     This calculation has not been     validated in all clinical     situations.     eGFR's persistently     <90 mL/min signify     possible Chronic Kidney Disease.  ETHANOL     Status: None   Collection Time    02/19/13  1:35 PM      Result Value Range   Alcohol, Ethyl (B) <11  0 - 11 mg/dL   Comment:            LOWEST DETECTABLE LIMIT FOR     SERUM ALCOHOL IS 11 mg/dL     FOR MEDICAL PURPOSES ONLY  SALICYLATE LEVEL     Status: Abnormal   Collection Time    02/19/13  1:35 PM      Result Value Range   Salicylate Lvl <2.0 (*) 2.8 - 20.0 mg/dL   Comment: CORRECTED RESULTS CALLED TO:     MITCHELL,M. RN AT 1450 02/19/13 BARFIELD,T     CORRECTED ON 03/27 AT 1454: PREVIOUSLY REPORTED AS 0.2  AMMONIA     Status: None   Collection Time    02/19/13  1:49 PM      Result Value Range    Ammonia 33  11 - 60 umol/L  CSF CELL COUNT WITH DIFFERENTIAL     Status: Abnormal   Collection Time    02/19/13  3:51 PM      Result Value Range   Tube # 4     Color, CSF COLORLESS  COLORLESS   Appearance, CSF CLEAR (*) CLEAR   Supernatant NOT INDICATED     RBC Count, CSF 2 (*) 0 /cu mm   WBC, CSF 1  0 - 5 /cu mm   Segmented Neutrophils-CSF TOO FEW TO COUNT, SMEAR AVAILABLE FOR REVIEW  0 - 6 %   Lymphs, CSF RARE LYMPHOCYTES  40 - 80 %  GRAM STAIN     Status: None   Collection Time    02/19/13  3:51 PM      Result Value Range   Specimen Description CSF     Special Requests NONE     Gram Stain       Value: CYTOSPIN     NO WBC SEEN     NO ORGANISMS SEEN     Gram Stain Report Called to,Read Back By and Verified With: M PRICE RN [phone] 02/19/13 A NAVARRO   Report Status 02/19/2013 FINAL    GLUCOSE, CSF     Status: Abnormal   Collection Time    02/19/13  3:51 PM      Result Value Range   Glucose, CSF 84 (*) 43 - 76 mg/dL  PROTEIN, CSF     Status: Abnormal   Collection Time    02/19/13  3:51 PM      Result Value Range   Total  Protein, CSF 58 (*) 15 - 45 mg/dL  URINE RAPID DRUG SCREEN (HOSP PERFORMED)     Status: Abnormal   Collection Time    02/19/13  6:04 PM      Result Value Range   Opiates NONE DETECTED  NONE DETECTED   Cocaine NONE DETECTED  NONE DETECTED   Benzodiazepines  POSITIVE (*) NONE DETECTED   Amphetamines NONE DETECTED  NONE DETECTED   Tetrahydrocannabinol POSITIVE (*) NONE DETECTED   Barbiturates NONE DETECTED  NONE DETECTED   Comment:            DRUG SCREEN FOR MEDICAL PURPOSES     ONLY.  IF CONFIRMATION IS NEEDED     FOR ANY PURPOSE, NOTIFY LAB     WITHIN 5 DAYS.                LOWEST DETECTABLE LIMITS     FOR URINE DRUG SCREEN     Drug Class       Cutoff (ng/mL)     Amphetamine      1000     Barbiturate      200     Benzodiazepine   200     Tricyclics       300     Opiates          300     Cocaine          300     THC              50   URINALYSIS, ROUTINE W REFLEX MICROSCOPIC     Status: Abnormal   Collection Time    02/19/13  6:04 PM      Result Value Range   Color, Urine YELLOW  YELLOW   APPearance CLOUDY (*) CLEAR   Specific Gravity, Urine 1.021  1.005 - 1.030   pH 5.5  5.0 - 8.0   Glucose, UA NEGATIVE  NEGATIVE mg/dL   Hgb urine dipstick LARGE (*) NEGATIVE   Bilirubin Urine NEGATIVE  NEGATIVE   Ketones, ur NEGATIVE  NEGATIVE mg/dL   Protein, ur 829 (*) NEGATIVE mg/dL   Urobilinogen, UA 0.2  0.0 - 1.0 mg/dL   Nitrite NEGATIVE  NEGATIVE   Leukocytes, UA NEGATIVE  NEGATIVE  URINE MICROSCOPIC-ADD ON     Status: Abnormal   Collection Time    02/19/13  6:04 PM      Result Value Range   Squamous Epithelial / LPF RARE  RARE   WBC, UA 3-6  <3 WBC/hpf   RBC / HPF 11-20  <3 RBC/hpf   Bacteria, UA RARE  RARE   Casts HYALINE CASTS (*) NEGATIVE   Comment: GRANULAR CAST    Ct Head Wo Contrast  02/19/2013  *RADIOLOGY REPORT*  Clinical Data: Altered mental status,  disoriented, confusion  CT HEAD WITHOUT CONTRAST  Technique:  Contiguous axial images were obtained from the base of the skull through the vertex without contrast.  Comparison: None.  Findings: The brain stem, cerebellum, cerebral peduncles, thalami, basal ganglia, basilar cisterns, and ventricular system appear unremarkable.  No intracranial hemorrhage, mass lesion, or acute infarction is identified.  The visualized paranasal sinuses appear clear.  IMPRESSION:  No significant abnormality identified.   Original Report Authenticated By: Gaylyn Rong, M.D.      Assessment/Plan: 62 years old male with PMH hypertension, depression, anxiety, substance abuse, admitted to the hospital with altered mental status and echolalia in association with elevated BP and tachycardia. CT brain, CSF, serologies, ETOH level unremarkable. He certainly has some clinical features suggestive of serotonin syndrome. Will recommend MRI brain and EEG to exclude other potential  etiologies for his syndrome. Will follow up.   Wyatt Portela, MD [facility] [phone]  02/19/2013, 7:14 PM

## 2013-02-19 NOTE — ED Provider Notes (Signed)
LUMBAR PUNCTURE Date/Time: 02/19/2013 3:51 PM Performed by: Jaci Carrel Authorized by: Jaci Carrel Consent: Verbal consent obtained. written consent obtained. Risks and benefits: risks, benefits and alternatives were discussed Consent given by: patient and spouse Patient understanding: patient states understanding of the procedure being performed Patient consent: the patient's understanding of the procedure matches consent given Procedure consent: procedure consent matches procedure scheduled Relevant documents: relevant documents present and verified Test results: test results available and properly labeled Site marked: the operative site was marked Imaging studies: imaging studies not available Patient identity confirmed: verbally with patient and arm band Time out: Immediately prior to procedure a "time out" was called to verify the correct patient, procedure, equipment, support staff and site/side marked as required. Indications: evaluation for infection and evaluation for altered mental status Anesthesia: local infiltration Local anesthetic: lidocaine 2% without epinephrine Anesthetic total: 2 ml Patient sedated: no Preparation: Patient was prepped and draped in the usual sterile fashion. Lumbar space: L4-L5 interspace Patient's position: sitting Needle gauge: 18 Needle type: spinal needle - Quincke tip Needle length: 3.5 in Number of attempts: 1 Fluid appearance: blood-tinged then clearing Tubes of fluid: 4 Total volume: 4.5 ml Post-procedure: site cleaned and adhesive bandage applied Patient tolerance: Patient tolerated the procedure well with no immediate complications.  Jaci Carrel, [place] 02/19/13 1553

## 2013-02-20 ENCOUNTER — Inpatient Hospital Stay (HOSPITAL_COMMUNITY)
Admit: 2013-02-20 | Discharge: 2013-02-20 | Disposition: A | Discharge status: Home / Self Care | Payer: 59 | Attending: Neurology | Admitting: Neurology

## 2013-02-20 DIAGNOSIS — R339 Retention of urine, unspecified: Secondary | ICD-10-CM

## 2013-02-20 DIAGNOSIS — I498 Other specified cardiac arrhythmias: Secondary | ICD-10-CM

## 2013-02-20 LAB — COMPREHENSIVE METABOLIC PANEL
ALT: 18 U/L (ref 0–53)
AST: 16 U/L (ref 0–37)
Alkaline Phosphatase: 60 U/L (ref 39–117)
CO2: 24 mEq/L (ref 19–32)
Calcium: 8.7 mg/dL (ref 8.4–10.5)
Chloride: 103 mEq/L (ref 96–112)
GFR calc non Af Amer: >90 mL/min (ref >90)
Potassium: 3.8 mEq/L (ref 3.5–5.1)
Sodium: 138 mEq/L (ref 135–145)

## 2013-02-20 LAB — VITAMIN B12: Vitamin B-12: 441 pg/mL (ref 211–911)

## 2013-02-20 LAB — CBC
Hemoglobin: 13.2 g/dL (ref 13.0–17.0)
Platelets: 206 10*3/uL (ref 150–400)
RBC: 4.12 MIL/uL — ABNORMAL LOW (ref 4.22–5.81)
WBC: 9.9 10*3/uL (ref 4.0–10.5)

## 2013-02-20 LAB — TROPONIN I: Troponin I: <0.3 ng/mL (ref <0.30)

## 2013-02-20 LAB — RPR: RPR Ser Ql: NONREACTIVE

## 2013-02-20 LAB — TSH: TSH: 1.899 u[IU]/mL (ref 0.350–4.500)

## 2013-02-20 MED ORDER — NICOTINE 7 MG/24HR TD PT24
7.0000 mg | MEDICATED_PATCH | Freq: Every day | TRANSDERMAL | Status: DC
  Administered 2013-02-20 – 2013-02-21 (×2): 7 mg via TRANSDERMAL
  Filled 2013-02-20 (×2): qty 1

## 2013-02-20 MED ORDER — METOPROLOL TARTRATE 25 MG PO TABS
25.0000 mg | ORAL_TABLET | Freq: Two times a day (BID) | ORAL | Status: DC
Start: 2013-02-20 — End: 2013-02-21
  Administered 2013-02-20 – 2013-02-21 (×2): 25 mg via ORAL
  Filled 2013-02-20 (×3): qty 1

## 2013-02-20 MED ORDER — ALPRAZOLAM 1 MG PO TABS
1.0000 mg | ORAL_TABLET | Freq: Three times a day (TID) | ORAL | Status: DC
  Administered 2013-02-20 – 2013-02-21 (×4): 1 mg via ORAL
  Filled 2013-02-20 (×4): qty 1

## 2013-02-20 MED ORDER — POTASSIUM CHLORIDE CRYS ER 20 MEQ PO TBCR
40.0000 meq | EXTENDED_RELEASE_TABLET | Freq: Once | ORAL | Status: AC
  Administered 2013-02-20: 40 meq via ORAL
  Filled 2013-02-20: qty 2

## 2013-02-20 NOTE — Progress Notes (Signed)
[facility] PROGRESS NOTE  REZNOR FERRANDO ZOX:[id number] DOB: October 13, 1951 DOA: 02/19/2013 PCP: Gaye Alken, MD  Assessment/Plan  Confusion, abnormal behavior:  Improved.  His exam suggested anticholinergic toxicity with flushing, anhidrosis, dilated pupils, urinary retention, tachycardia, and confusion, all of which are improved today. ACh toxicity can be induced by olanzapine, which he takes regularly, however, he is only on 2.5mg  daily.  He denies use of other anticholinergic medications.  Serotonin syndrome possible if he had overdosed on his SSRI and SNRI and symptoms resolved relatively quickly.  He exhibited symptoms that were not completely consistent, however, with serotonin syndrome and he never had fever to muscle rigidity.   - Continue Telemetry  - Appreciate Neurology recommendations -  MRI negative for stroke - Ativan as needed for agitation - Continue to hold olanzapine, paroxetine, cymbalta - may experience symptoms of withdrawal from these medications - Follow up UDS: Benzo and THC positive  - TSH 1.9, RPR NR, B12 441  Leukocytosis, mild and resolved this morning - CXR neg - UA neg  Hypokalemia, may be due to HCTZ.  Resolved this morning - Hold diuretic  - magnesium wnl  Abnl ECG, reviewed by cardiology  - troponins neg - Repeat ECG demonstrates resolution of changes after HR trended down  Sinus tachycardia, improved in ER with IVF and has been normal since last night.  LIkely related to resolution of whatever drug toxicity he experienced - TSH wnl - Continue IVF  Elevated BP, trended down to wnl  Urinary retention - d/c foley  -  PVR in 4 hours. Replace foley if >   IBS, stable  - Monitor for diarrhea   Possibly globulin gap - resolved this AM and was likely related to dehydration  Diet:  Healthy heart Access:  PIV IVF:  NS at 47ml/h  Proph:  lovenox  Code Status: full code Family Communication: spoke with patient and  wife Disposition Plan: transfer to telemetry for ongoing investigation and monitoring   Consultants:  Neurology  Procedures:  MRI brain  CT head  Lumbar puncture 3/27  Antibiotics:  None   HPI/Subjective:  States his mild headache from yesterday has resolved.  BLurry vision resolved.  Denies cold symptoms, N/V.  Had one episode of diarrhea, stable from baseline.  Continues to have some muscle twitching but less than yesterday.    Objective: Filed Vitals:   02/20/13 0300 02/20/13 0400 02/20/13 0500 02/20/13 0600  BP: 153/92 153/85 176/91 137/66  Pulse: 65 69 82 66  Temp: 98.8 F (37.1 C) 98.8 F (37.1 C) 98.6 F (37 C) 97.9 F (36.6 C)  TempSrc:  Core (Comment)    Resp: 20 18 28 17   Height:      Weight:      SpO2: 95% 96% 96% 97%    Intake/Output Summary (Last 24 hours) at 02/20/13 0857 Last data filed at 02/20/13 0600  Gross per 24 hour  Intake 2537.5 ml  Output   1501 ml  Net 1036.5 ml   Filed Weights   02/19/13 1800 02/20/13 0000  Weight: 91 kg (200 lb 9.9 oz) 93.3 kg (205 lb 11 oz)    Exam:   General:  Caucasian male,  No acute distress, speech much more articulate this morning  HEENT:  NCAT, MMM  Cardiovascular:  RRR, nl S1, S2 no mrg, 2+ pulses, warm extremities  Respiratory:  CTAB, no increased WOB  Abdomen:   NABS, soft, NT/ND  MSK:   Normal tone and bulk, no LEE  Neuro:  Speech much more articulate.  Able to have conversation.  Pupils now ERRL.  No obvious muscle twitches.  Reflexes decreased from yesterday although still mildly brisker on the right leg.  No clonus  Data Reviewed: Basic Metabolic Panel:  Recent Labs Lab 02/19/13 1335 02/19/13 1900 02/19/13 2215 02/20/13 0500  NA 135  --   --  138  K 2.6*  --  3.0* 3.8  CL 96  --   --  103  CO2 20  --   --  24  GLUCOSE 152*  --   --  117*  BUN 12  --   --  11  CREATININE 0.96  --   --  0.86  CALCIUM 9.8  --   --  8.7  MG  --  1.8  --   --    Liver Function Tests:  Recent  Labs Lab 02/19/13 1335 02/20/13 0500  AST 17 16  ALT 25 18  ALKPHOS 74 60  BILITOT 0.4 0.5  PROT 8.4* 7.0  ALBUMIN 4.3 3.4*   No results found for this basename: LIPASE, AMYLASE,  in the last 168 hours  Recent Labs Lab 02/19/13 1349  AMMONIA 33   CBC:  Recent Labs Lab 02/19/13 1335 02/20/13 0500  WBC 13.8* 9.9  HGB 14.7 13.2  HCT 42.3 38.1*  MCV 90.6 92.5  PLT 242 206   Cardiac Enzymes:  Recent Labs Lab 02/19/13 1334 02/19/13 1900 02/20/13 0055 02/20/13 0635  CKTOTAL 79  --   --   --   TROPONINI  --  <0.30 <0.30 <0.30   BNP (last 3 results) No results found for this basename: PROBNP,  in the last 8760 hours CBG: No results found for this basename: GLUCAP,  in the last 168 hours  Recent Results (from the past 240 hour(s))  CSF CULTURE     Status: None   Collection Time    02/19/13  3:51 PM      Result Value Range Status   Specimen Description CSF   Final   Special Requests NONE   Final   Gram Stain     Final   Value: CYTOSPIN NO WBC SEEN     NO ORGANISMS SEEN     Gram Stain Report Called to,Read Back By and Verified With: Gram Stain Report Called to,Read Back By and Verified With: M PRICE RN [phone] 02/19/13 A NAVARRO Performed by [facility]   Culture NO GROWTH   Final   Report Status PENDING   Incomplete  GRAM STAIN     Status: None   Collection Time    02/19/13  3:51 PM      Result Value Range Status   Specimen Description CSF   Final   Special Requests NONE   Final   Gram Stain     Final   Value: CYTOSPIN     NO WBC SEEN     NO ORGANISMS SEEN     Gram Stain Report Called to,Read Back By and Verified With: M PRICE RN [id number] 02/19/13 A NAVARRO   Report Status 02/19/2013 FINAL   Final  MRSA PCR SCREENING     Status: None   Collection Time    02/19/13  5:59 PM      Result Value Range Status   MRSA by PCR NEGATIVE  NEGATIVE Final   Comment:            The GeneXpert MRSA Assay (FDA     approved  for NASAL specimens     only), is one  component of a     comprehensive MRSA colonization     surveillance program. It is not     intended to diagnose MRSA     infection nor to guide or     monitor treatment for     MRSA infections.     Studies: Ct Head Wo Contrast  02/19/2013  *RADIOLOGY REPORT*  Clinical Data: Altered mental status,  disoriented, confusion  CT HEAD WITHOUT CONTRAST  Technique:  Contiguous axial images were obtained from the base of the skull through the vertex without contrast.  Comparison: None.  Findings: The brain stem, cerebellum, cerebral peduncles, thalami, basal ganglia, basilar cisterns, and ventricular system appear unremarkable.  No intracranial hemorrhage, mass lesion, or acute infarction is identified.  The visualized paranasal sinuses appear clear.  IMPRESSION:  No significant abnormality identified.   Original Report Authenticated By: Gaylyn Rong, M.D.    Mr Brain Wo Contrast  02/20/2013  *RADIOLOGY REPORT*  Clinical Data: 62 year old male with altered mental status, very confused.  Hypertension.  MRI HEAD WITHOUT CONTRAST  Technique:  Multiplanar, multiecho pulse sequences of the brain and surrounding structures were obtained according to standard protocol without intravenous contrast.  Comparison: Head CT without contrast 02/19/2013.  Findings: No restricted diffusion to suggest acute infarction.  No midline shift, mass effect, evidence of mass lesion, ventriculomegaly, extra-axial collection or acute intracranial hemorrhage.  Cervicomedullary junction and pituitary are within normal limits.  Major intracranial vascular flow voids are preserved.  Scattered small areas of T2 and FLAIR hyperintensity in the cerebral white matter, most subcortical.  No cortical encephalomalacia.  Deep gray matter nuclei, brainstem and cerebellum within normal limits.  Negative visualized cervical spine.  Normal bone marrow signal.  No cortical encephalomalacia.  Visualized orbit soft tissues are within normal limits.   Visualized paranasal sinuses and mastoids are clear. Incidental small left nasopharyngeal retention cyst.  Negative scalp soft tissues.  IMPRESSION: 1. No acute intracranial abnormality. 2.  Mild to moderate for age nonspecific cerebral white matter signal changes, most commonly due to chronic small vessel disease.   Original Report Authenticated By: Erskine Speed, M.D.    Dg Chest Port 1 View  02/19/2013  *RADIOLOGY REPORT*  Clinical Data: Tachycardia, leukocytosis  PORTABLE CHEST - 1 VIEW  Comparison: 10/02/2007  Findings: Lungs clear.  No effusion.  Heart size upper limits normal.  Regional bones unremarkable.  IMPRESSION:  No acute disease   Original Report Authenticated By: D. Andria Rhein, MD     Scheduled Meds: . enoxaparin (LOVENOX) injection  40 mg Subcutaneous Q24H  . losartan  50 mg Oral Daily  . metoprolol tartrate  25 mg Oral Daily  . sodium chloride  3 mL Intravenous Q12H   Continuous Infusions: . 0.9 % NaCl with KCl 20 mEq / L 150 mL/hr at 02/19/13 2307    Active Problems:   Nonspecific abnormal electrocardiogram (ECG) (EKG)   Urinary retention   Echolalia   Encephalopathy, metabolic   Sinus tachycardia   Leukocytosis, unspecified    Time spent: 30 min    Mahoganie Basher, [facility]  [facility] Pager [phone]. If 7PM-7AM, please contact night-coverage at [email or web address], password [facility] 02/20/2013, 8:57 AM  LOS: 1 day

## 2013-02-20 NOTE — Procedures (Signed)
EEG report.  Brief clinical history: 62 years old male admitted with altered mental status and echolalia. No prior history of frank epileptic seizures.  Technique: this is a 17 channel routine scalp EEG performed at the bedside with bipolar and monopolar montages arranged in accordance to the international 10/20 system of electrode placement. One channel was dedicated to EKG recording.  No drowsiness or sleep achieved. No activating procedures performed during the test.  Description:In the wakeful state, the best background consisted of a medium amplitude, posterior dominant, well sustained, symmetric and reactive 10 Hz rhythm. No focal or generalized epileptiform discharges noted.  No slowing seen.  EKG showed sinus rhythm.  Impression: this is a normal awake EEG. Please, be aware that a normal EEG does not exclude the possibility of epilepsy.  Clinical correlation is advised.  Wyatt Portela, MD

## 2013-02-20 NOTE — Progress Notes (Signed)
Offsite portable adult EEG completed at [facility]. 

## 2013-02-21 DIAGNOSIS — G9341 Metabolic encephalopathy: Secondary | ICD-10-CM

## 2013-02-21 LAB — CBC
HCT: 37 % — ABNORMAL LOW (ref 39.0–52.0)
MCHC: 35.4 g/dL (ref 30.0–36.0)
MCV: 93.4 fL (ref 78.0–100.0)
RDW: 13.2 % (ref 11.5–15.5)
WBC: 8.2 10*3/uL (ref 4.0–10.5)

## 2013-02-21 LAB — BASIC METABOLIC PANEL
Calcium: 8.4 mg/dL (ref 8.4–10.5)
GFR calc non Af Amer: >90 mL/min (ref >90)
Glucose, Bld: 106 mg/dL — ABNORMAL HIGH (ref 70–99)
Potassium: 4.6 mEq/L (ref 3.5–5.1)
Sodium: 139 mEq/L (ref 135–145)

## 2013-02-21 LAB — URINE CULTURE

## 2013-02-21 MED ORDER — LOSARTAN POTASSIUM 50 MG PO TABS: 50.0000 mg | ORAL_TABLET | Freq: Every day | ORAL | Status: DC

## 2013-02-21 NOTE — Discharge Summary (Signed)
Physician Discharge Summary  Joseph Fitzpatrick FAO:[id number] DOB: 26-Oct-1951 DOA: 02/19/2013  PCP: Gaye Alken, MD  Admit date: 02/19/2013 Discharge date: 02/21/2013  Recommendations for Outpatient Follow-up:  1. Call psychiatrist on Monday to discuss re initiation of medications or trial of different medication 2. Follow up with primary care doctor within one week for repeat examination and blood pressure check.  Work up of diarrhea if not already compelte.  Follow up final results of CSF culture.    Discharge Diagnoses:  Active Problems:   Nonspecific abnormal electrocardiogram (ECG) (EKG)   Urinary retention   Echolalia   Encephalopathy, metabolic   Sinus tachycardia   Leukocytosis, unspecified   Discharge Condition: stable, improved  Diet recommendation: healthy heart  Wt Readings from Last 3 Encounters:  02/20/13 93.3 kg (205 lb 11 oz)  07/09/12 81.466 kg (179 lb 9.6 oz)    History of present illness:   The patient is a 62 y.o. year-old male with history of HTN, BPH, distant history of substance abuse, depression and anxiety on olanzapine, paroxetine, and cymbalta, who presents with echolalia and abnormal speech. The patient was last at their baseline health yesterday evening before going to bed. He is at baseline, per wife, normal in behavior and speech. This morning, he started emailing her with one word emails with her name and other random Jovani Colquhoun words. He also started calling her on her cell phone repeatedly while she was in the house and when she picked up the phone, he was unable to speak to her. He was able to communicate with difficulty with Ebba Goll sentences and yes/no answers. He denies focal weakness, numbness. Wife denies changes to facial appearance and concurs with lack of focal weakness. He endorses some blurry vision, feeling flushed or hot, and having muscle twitches, particularly around his eyes, his right arm, and bilateral legs. Denies neck stiffness,  but endorses headache. He repeatedly denied recent use of benadryl, OTC allergy or cold medications, accidental or intentional overdose of his prescription medications. Denies recent changes to his medications and doses of medications. Denies illicit drug use, including recent THC, cocaine, bath salts, molly, etc.   In the ER, his vital signs were notable for temperature 99.61F, tachycardia to the 130s, mildly elevated blood pressure to 162/101. Labs demonstrated WBC 13.8 and potassium of 2.6, but were otherwise normal. Head CT was negative. He underwent LP, results of which are pending. Poison control was notified who recommended close observation for elevations in temperature, heart rhythm problems, and muscle rigidity given his concurrent use of paroxetine and cymbalta. In the ER, he had urinary retention with of retained urine so foley was placed. I spoke with Dr. Leroy Kennedy from Neurology to assess the patient given his unusual constellation of symptoms.  tpa not administed as NIH stroke scale = 0   Hospital Course:   Confusion, abnormal behavior: His exam suggested anticholinergic toxicity with flushing, anhidrosis, dilated pupils, urinary retention, tachycardia, and confusion.  ACh toxicity can be induced by olanzapine, which he takes regularly, however, he is only on 2.5mg  daily and repeatedly denies overdose. He denied use of other anticholinergic medications.  Serotonin syndrome was possible, however, he has been taking paroxetine for years at the same dose and again denies overdose.  He states he has not taken cymbalta in years.  Also, he did not have fever or muscle rigidity.  He does smoke pot and states that his pot has been from the same dealer but has been stronger recently.  Was  it laced with something?  Finally, he states that he has felt "off" and tremulous since starting his gabapentin.  He was seen by Neurology and underwent MRI and EEG which were normal.  His UDS was positive for THC  and benzos as expected.  TSH 1.9, RPR NR, B12 441.  LP had normal WBC, RBC, glucose and mildly elevated protein.  Culture negative to date.  He should stop his paroxetine, gabapentin, and olanzapine until he talks with his psychiatrist and I counseled repeatedly to stop smoking pot.  He is not completely back to baseline but has gone from 0% to 80% of baseline since admission.    Leukocytosis, mild.  CXR neg,  UA neg, and was likely due to dehydration.  He was hydrated and it resolved.    Hypokalemia, may be due to HCTZ.  His magnesium was wnl.  He was given oral and IV replacement of potassium and it resolved.  He should stop his HCTZ and follow up with his primary care doctor for repeat electrolytes in 1 week.    Abnl ECG, reviewed by cardiology.  troponins neg.  Repeat ECG demonstrates resolution of changes after HR trended down and per report from distant H&P, appears stable compared to baseline.   Sinus tachycardia to the 130s and 140s initially which was likely secondary to #1.  His TSH was wnl and he was given IVF and his HR trended down. IT has been normal sinus on telemetry for the last 24 hours.    Elevated BP, trended down to wnl but has been labile.  Anticipate that it may be mildly elevated until he is back to his baseline.  Recheck as outpatient.   Urinary retention:  He required foley placement, but was able to d/c foley on 3/28 without complication.    IBS, stable.  He has chronic diarrhea and will follow up with his primary care doctor.    Consultants:  Neurology Procedures:  MRI brain  CT head  Lumbar puncture 3/27 Antibiotics:  None   Discharge Exam: Filed Vitals:   02/21/13 0609  BP: 166/87  Pulse: 66  Temp: 98.3 F (36.8 C)  Resp: 14   Filed Vitals:   02/20/13 1100 02/20/13 1313 02/20/13 2046 02/21/13 0609  BP: 171/85 153/68 116/71 166/87  Pulse: 76 73 82 66  Temp:  98.3 F (36.8 C) 99 F (37.2 C) 98.3 F (36.8 C)  TempSrc:  Oral Oral Oral  Resp: 13 18  16 14   Height:  5\' 10"  (1.778 m)    Weight:  93.3 kg (205 lb 11 oz)    SpO2: 97% 95% 100% 98%   States he feels almost back to baseline.  Still has some tremulousness or twitching which is abnormal, but otherwise, he feels well.  Denies urinary retention.  Stable diarrhea.    General: Caucasian male, No acute distress, speech articulate this morning HEENT: NCAT, MMM  Cardiovascular: RRR, nl S1, S2 no mrg, 2+ pulses, warm extremities  Respiratory: CTAB, no increased WOB  Abdomen: NABS, soft, NT/ND  MSK: Normal tone and bulk, no LEE Neuro: Speech articulate. Able to have normal conversation.  A&Ox4.  PERRL. No obvious muscle twitches. Reflexes stable from yesterday although still mildly brisker on the right leg. No clonus    Discharge Instructions      Discharge Orders   Future Orders Complete By Expires     Call MD for:  difficulty breathing, headache or visual disturbances  As directed  Call MD for:  extreme fatigue  As directed     Call MD for:  hives  As directed     Call MD for:  persistant dizziness or light-headedness  As directed     Call MD for:  persistant nausea and vomiting  As directed     Call MD for:  severe uncontrolled pain  As directed     Call MD for:  temperature >100.4  As directed     Diet - low sodium heart healthy  As directed     Discharge instructions  As directed     Comments:      You were hospitalized with symptoms suggestive of drug toxicity or overdose.  It is unclear what medication or drug you reacted to or were exposed to.  Please stop your olanzapine, paroxetine, and gabapentin for now and call your psychiatrist on Monday to discuss the possibility of restarting these medications one at a time under close supervision.  Please STOP using marijuana altogether.  Follow up with your primary care doctor in 1 week for complete examination, to follow up the final results of your cultures, and to discuss your diarrhea.  Finally, you had very low potassium  when you arrived, which may be due to the HCTZ in one of your medications.  Please stop your combination losartan-HCTZ for now and I gave you a prescription for just the losartan portion.  Please continue your metoprolol as before.  Your primary care doctor can recheck your blood pressure and potassium at your follow up appointment and give you further advice about your blood pressure medications.  I gave you information about each of your medications (and benadryl) so that you can learn more about the syndromes or side effects that you may have experienced.    Increase activity slowly  As directed         Medication List    STOP taking these medications       gabapentin 300 MG capsule  Commonly known as:  NEURONTIN     losartan-hydrochlorothiazide 50-12.5 MG per tablet  Commonly known as:  HYZAAR     PARoxetine 40 MG tablet  Commonly known as:  PAXIL      TAKE these medications       ALPRAZolam 1 MG tablet  Commonly known as:  XANAX  Take 1 mg by mouth 3 (three) times daily as needed.     losartan 50 MG tablet  Commonly known as:  COZAAR  Take 1 tablet (50 mg total) by mouth daily.     metoprolol tartrate 25 MG tablet  Commonly known as:  LOPRESSOR  Take 25 mg by mouth daily.     simvastatin 40 MG tablet  Commonly known as:  ZOCOR  Take 40 mg by mouth every evening.       Follow-up Information   Follow up with Gaye Alken, MD. Schedule an appointment as soon as possible for a visit in 1 week.   Contact information:   [address] [place] [address] [phone]       Follow up with PLOVSKY, [place], MD. Call in 2 days.   Contact information:   [address] [place] [address] [phone]        The results of significant diagnostics from this hospitalization (including imaging, microbiology, ancillary and laboratory) are listed below for reference.    Significant Diagnostic Studies: Ct Head Wo Contrast  02/19/2013   *RADIOLOGY REPORT*  Clinical Data:  Altered mental status,  disoriented, confusion  CT HEAD WITHOUT CONTRAST  Technique:  Contiguous axial images were obtained from the base of the skull through the vertex without contrast.  Comparison: None.  Findings: The brain stem, cerebellum, cerebral peduncles, thalami, basal ganglia, basilar cisterns, and ventricular system appear unremarkable.  No intracranial hemorrhage, mass lesion, or acute infarction is identified.  The visualized paranasal sinuses appear clear.  IMPRESSION:  No significant abnormality identified.   Original Report Authenticated By: Gaylyn Rong, M.D.    Mr Brain Wo Contrast  02/20/2013  *RADIOLOGY REPORT*  Clinical Data: 62 year old male with altered mental status, very confused.  Hypertension.  MRI HEAD WITHOUT CONTRAST  Technique:  Multiplanar, multiecho pulse sequences of the brain and surrounding structures were obtained according to standard protocol without intravenous contrast.  Comparison: Head CT without contrast 02/19/2013.  Findings: No restricted diffusion to suggest acute infarction.  No midline shift, mass effect, evidence of mass lesion, ventriculomegaly, extra-axial collection or acute intracranial hemorrhage.  Cervicomedullary junction and pituitary are within normal limits.  Major intracranial vascular flow voids are preserved.  Scattered small areas of T2 and FLAIR hyperintensity in the cerebral white matter, most subcortical.  No cortical encephalomalacia.  Deep gray matter nuclei, brainstem and cerebellum within normal limits.  Negative visualized cervical spine.  Normal bone marrow signal.  No cortical encephalomalacia.  Visualized orbit soft tissues are within normal limits.  Visualized paranasal sinuses and mastoids are clear. Incidental small left nasopharyngeal retention cyst.  Negative scalp soft tissues.  IMPRESSION: 1. No acute intracranial abnormality. 2.  Mild to moderate for age nonspecific cerebral white matter  signal changes, most commonly due to chronic small vessel disease.   Original Report Authenticated By: Erskine Speed, M.D.    Dg Chest Port 1 View  02/19/2013  *RADIOLOGY REPORT*  Clinical Data: Tachycardia, leukocytosis  PORTABLE CHEST - 1 VIEW  Comparison: 10/02/2007  Findings: Lungs clear.  No effusion.  Heart size upper limits normal.  Regional bones unremarkable.  IMPRESSION:  No acute disease   Original Report Authenticated By: D. Andria Rhein, MD     Microbiology: Recent Results (from the past 240 hour(s))  CSF CULTURE     Status: None   Collection Time    02/19/13  3:51 PM      Result Value Range Status   Specimen Description CSF   Final   Special Requests NONE   Final   Gram Stain     Final   Value: CYTOSPIN NO WBC SEEN     NO ORGANISMS SEEN     Gram Stain Report Called to,Read Back By and Verified With: Gram Stain Report Called to,Read Back By and Verified With: M PRICE RN [id number] 02/19/13 A NAVARRO Performed by [facility]   Culture NO GROWTH   Final   Report Status PENDING   Incomplete  GRAM STAIN     Status: None   Collection Time    02/19/13  3:51 PM      Result Value Range Status   Specimen Description CSF   Final   Special Requests NONE   Final   Gram Stain     Final   Value: CYTOSPIN     NO WBC SEEN     NO ORGANISMS SEEN     Gram Stain Report Called to,Read Back By and Verified With: Katina Degree RN [id number] 02/19/13 A NAVARRO   Report Status 02/19/2013 FINAL   Final  MRSA PCR SCREENING  Status: None   Collection Time    02/19/13  5:59 PM      Result Value Range Status   MRSA by PCR NEGATIVE  NEGATIVE Final   Comment:            The GeneXpert MRSA Assay (FDA     approved for NASAL specimens     only), is one component of a     comprehensive MRSA colonization     surveillance program. It is not     intended to diagnose MRSA     infection nor to guide or     monitor treatment for     MRSA infections.  URINE CULTURE     Status: None   Collection Time     02/19/13  6:04 PM      Result Value Range Status   Specimen Description URINE, RANDOM   Final   Special Requests NONE   Final   Culture  Setup Time 02/20/2013 03:09   Final   Colony Count NO GROWTH   Final   Culture NO GROWTH   Final   Report Status 02/21/2013 FINAL   Final     Labs: Basic Metabolic Panel:  Recent Labs Lab 02/19/13 1335 02/19/13 1900 02/19/13 2215 02/20/13 0500 02/21/13 0545  NA 135  --   --  138 139  K 2.6*  --  3.0* 3.8 4.6  CL 96  --   --  103 107  CO2 20  --   --  24 23  GLUCOSE 152*  --   --  117* 106*  BUN 12  --   --  11 10  CREATININE 0.96  --   --  0.86 0.82  CALCIUM 9.8  --   --  8.7 8.4  MG  --  1.8  --   --   --    Liver Function Tests:  Recent Labs Lab 02/19/13 1335 02/20/13 0500  AST 17 16  ALT 25 18  ALKPHOS 74 60  BILITOT 0.4 0.5  PROT 8.4* 7.0  ALBUMIN 4.3 3.4*   No results found for this basename: LIPASE, AMYLASE,  in the last 168 hours  Recent Labs Lab 02/19/13 1349  AMMONIA 33   CBC:  Recent Labs Lab 02/19/13 1335 02/20/13 0500 02/21/13 0741  WBC 13.8* 9.9 8.2  HGB 14.7 13.2 13.1  HCT 42.3 38.1* 37.0*  MCV 90.6 92.5 93.4  PLT 242 206 202   Cardiac Enzymes:  Recent Labs Lab 02/19/13 1334 02/19/13 1900 02/20/13 0055 02/20/13 0635  CKTOTAL 79  --   --   --   TROPONINI  --  <0.30 <0.30 <0.30   BNP: BNP (last 3 results) No results found for this basename: PROBNP,  in the last 8760 hours CBG: No results found for this basename: GLUCAP,  in the last 168 hours  Time coordinating discharge: 45 minutes  Signed:  Devesh Monforte  [facility] 02/21/2013, 9:19 AM

## 2013-02-22 LAB — CSF CULTURE W GRAM STAIN: Gram Stain: NONE SEEN

## 2013-12-24 ENCOUNTER — Ambulatory Visit (INDEPENDENT_AMBULATORY_CARE_PROVIDER_SITE_OTHER): Payer: 59 | Admitting: Surgery

## 2013-12-24 ENCOUNTER — Encounter (INDEPENDENT_AMBULATORY_CARE_PROVIDER_SITE_OTHER): Payer: Self-pay | Admitting: Surgery

## 2013-12-24 VITALS — BP 104/70 | HR 80 | Resp 14 | Ht 71.0 in | Wt 221.8 lb

## 2013-12-24 DIAGNOSIS — L089 Local infection of the skin and subcutaneous tissue, unspecified: Secondary | ICD-10-CM

## 2013-12-24 DIAGNOSIS — L723 Sebaceous cyst: Secondary | ICD-10-CM

## 2013-12-24 MED ORDER — HYDROCODONE-ACETAMINOPHEN 5-325 MG PO TABS: 1.0000 | ORAL_TABLET | ORAL | Status: DC | PRN

## 2013-12-24 NOTE — Progress Notes (Signed)
Urgent Office  Filed Vitals:   12/24/13 1452  BP: 104/70  Pulse: 80  Resp: 14    This  Patient presented 2 years ago with a draining sebaceous cyst in the middle of his back. He was evaluated by Dr. Grandville Silos who recommended excision. The patient chose not to have it done. He has had a total of 3 episodes where the cyst enlarged, became very swollen, tender, and then spontaneously ruptured. Over the last week this area has become much more swollen. It ruptured 2 days ago. He remains fairly tender. He comes in today for evaluation.  In the center of his back between his scapulae there is a 1 cm protruding cyst with some purulent sebum draining from very small pinhole. Minimal surrounding erythema.  We prepped this area with Betadine and anesthetized with 1% lidocaine. I made a round incision in the middle of the cyst. We expressed a large amount of sebum. I was able to debride some of the cyst wall. The wound is packed with Nu Gauze. He will remove the dressing in 24 hours and remove the packing. He was then treated with Neosporin and dry dressing. Reevaluate 1 week.  PRN Vicodin  Imogene Burn. Georgette Dover, MD, [facility] Surgery  General/ Trauma Surgery  12/24/2013 4:27 PM

## 2013-12-30 ENCOUNTER — Encounter (INDEPENDENT_AMBULATORY_CARE_PROVIDER_SITE_OTHER): Payer: 59 | Admitting: General Surgery

## 2014-01-05 ENCOUNTER — Encounter (INDEPENDENT_AMBULATORY_CARE_PROVIDER_SITE_OTHER): Payer: Self-pay | Admitting: Surgery

## 2014-01-05 ENCOUNTER — Ambulatory Visit (INDEPENDENT_AMBULATORY_CARE_PROVIDER_SITE_OTHER): Payer: 59 | Admitting: Surgery

## 2014-01-05 VITALS — BP 124/83 | HR 70 | Temp 98.7°F | Resp 16 | Ht 71.0 in | Wt 228.0 lb

## 2014-01-05 DIAGNOSIS — L089 Local infection of the skin and subcutaneous tissue, unspecified: Secondary | ICD-10-CM

## 2014-01-05 DIAGNOSIS — L723 Sebaceous cyst: Secondary | ICD-10-CM

## 2014-01-05 NOTE — Progress Notes (Signed)
Status post incision and drainage of an infected sebaceous cyst of the back performed on 1/29.  The wound is almost completely healed. Minimal drainage. No surrounding erythema or tenderness. He should treat this with Neosporin and a dry dressing until completely healed. Followup as needed.  Imogene Burn. Georgette Dover, MD, [facility] Surgery  General/ Trauma Surgery  01/05/2014 2:39 PM

## 2015-09-15 ENCOUNTER — Inpatient Hospital Stay (HOSPITAL_COMMUNITY)
Admission: EM | Admit: 2015-09-15 | Discharge: 2015-09-17 | DRG: 247 | Disposition: A | Discharge status: Home / Self Care | Payer: 59 | Attending: Cardiology | Admitting: Cardiology

## 2015-09-15 ENCOUNTER — Emergency Department (HOSPITAL_COMMUNITY): Payer: 59

## 2015-09-15 ENCOUNTER — Encounter (HOSPITAL_COMMUNITY): Payer: Self-pay | Admitting: Emergency Medicine

## 2015-09-15 DIAGNOSIS — Z79891 Long term (current) use of opiate analgesic: Secondary | ICD-10-CM

## 2015-09-15 DIAGNOSIS — F419 Anxiety disorder, unspecified: Secondary | ICD-10-CM | POA: Diagnosis present

## 2015-09-15 DIAGNOSIS — I10 Essential (primary) hypertension: Secondary | ICD-10-CM | POA: Diagnosis present

## 2015-09-15 DIAGNOSIS — I214 Non-ST elevation (NSTEMI) myocardial infarction: Secondary | ICD-10-CM | POA: Diagnosis not present

## 2015-09-15 DIAGNOSIS — I2510 Atherosclerotic heart disease of native coronary artery without angina pectoris: Secondary | ICD-10-CM | POA: Diagnosis present

## 2015-09-15 DIAGNOSIS — Z720 Tobacco use: Secondary | ICD-10-CM | POA: Diagnosis present

## 2015-09-15 DIAGNOSIS — E669 Obesity, unspecified: Secondary | ICD-10-CM | POA: Diagnosis present

## 2015-09-15 DIAGNOSIS — N182 Chronic kidney disease, stage 2 (mild): Secondary | ICD-10-CM | POA: Diagnosis present

## 2015-09-15 DIAGNOSIS — I209 Angina pectoris, unspecified: Secondary | ICD-10-CM

## 2015-09-15 DIAGNOSIS — N400 Enlarged prostate without lower urinary tract symptoms: Secondary | ICD-10-CM | POA: Diagnosis present

## 2015-09-15 DIAGNOSIS — N183 Chronic kidney disease, stage 3 (moderate): Secondary | ICD-10-CM | POA: Diagnosis present

## 2015-09-15 DIAGNOSIS — Z6829 Body mass index (BMI) 29.0-29.9, adult: Secondary | ICD-10-CM

## 2015-09-15 DIAGNOSIS — R7303 Prediabetes: Secondary | ICD-10-CM | POA: Diagnosis present

## 2015-09-15 DIAGNOSIS — R079 Chest pain, unspecified: Secondary | ICD-10-CM | POA: Insufficient documentation

## 2015-09-15 DIAGNOSIS — R001 Bradycardia, unspecified: Secondary | ICD-10-CM | POA: Diagnosis not present

## 2015-09-15 DIAGNOSIS — F329 Major depressive disorder, single episode, unspecified: Secondary | ICD-10-CM | POA: Diagnosis present

## 2015-09-15 DIAGNOSIS — I129 Hypertensive chronic kidney disease with stage 1 through stage 4 chronic kidney disease, or unspecified chronic kidney disease: Secondary | ICD-10-CM | POA: Diagnosis present

## 2015-09-15 DIAGNOSIS — K589 Irritable bowel syndrome without diarrhea: Secondary | ICD-10-CM | POA: Diagnosis present

## 2015-09-15 DIAGNOSIS — E7800 Pure hypercholesterolemia, unspecified: Secondary | ICD-10-CM | POA: Diagnosis present

## 2015-09-15 DIAGNOSIS — F17200 Nicotine dependence, unspecified, uncomplicated: Secondary | ICD-10-CM | POA: Diagnosis present

## 2015-09-15 DIAGNOSIS — E785 Hyperlipidemia, unspecified: Secondary | ICD-10-CM | POA: Diagnosis present

## 2015-09-15 DIAGNOSIS — Z79899 Other long term (current) drug therapy: Secondary | ICD-10-CM

## 2015-09-15 HISTORY — DX: Essential (primary) hypertension: I10

## 2015-09-15 HISTORY — DX: Gastro-esophageal reflux disease without esophagitis: K21.9

## 2015-09-15 HISTORY — DX: Personal history of urinary calculi: Z87.442

## 2015-09-15 HISTORY — DX: Personal history of other diseases of the digestive system: Z87.19

## 2015-09-15 HISTORY — DX: Other psychoactive substance abuse, in remission: F19.11

## 2015-09-15 HISTORY — DX: Atherosclerotic heart disease of native coronary artery without angina pectoris: I25.10

## 2015-09-15 HISTORY — DX: Prediabetes: R73.03

## 2015-09-15 HISTORY — DX: Pure hypercholesterolemia, unspecified: E78.00

## 2015-09-15 HISTORY — DX: Personal history of pneumonia (recurrent): Z87.01

## 2015-09-15 LAB — CBC
HCT: 39.6 % (ref 39.0–52.0)
Hemoglobin: 13.3 g/dL (ref 13.0–17.0)
MCH: 30.4 pg (ref 26.0–34.0)
MCHC: 33.6 g/dL (ref 30.0–36.0)
MCV: 90.6 fL (ref 78.0–100.0)
PLATELETS: 185 10*3/uL (ref 150–400)
RBC: 4.37 MIL/uL (ref 4.22–5.81)
RDW: 13.3 % (ref 11.5–15.5)
WBC: 9.9 10*3/uL (ref 4.0–10.5)

## 2015-09-15 LAB — BASIC METABOLIC PANEL
Anion gap: 7 (ref 5–15)
BUN: 13 mg/dL (ref 6–20)
CALCIUM: 9.1 mg/dL (ref 8.9–10.3)
CO2: 29 mmol/L (ref 22–32)
CREATININE: 1.23 mg/dL (ref 0.61–1.24)
Chloride: 103 mmol/L (ref 101–111)
GFR calc Af Amer: >60 mL/min (ref >60)
GFR calc non Af Amer: >60 mL/min (ref >60)
GLUCOSE: 118 mg/dL — AB (ref 65–99)
Potassium: 4.1 mmol/L (ref 3.5–5.1)
Sodium: 139 mmol/L (ref 135–145)

## 2015-09-15 LAB — I-STAT TROPONIN, ED: Troponin i, poc: 0.14 ng/mL (ref 0.00–0.08)

## 2015-09-15 LAB — LIPASE, BLOOD: Lipase: 29 U/L (ref 11–51)

## 2015-09-15 LAB — PROTIME-INR
INR: 1.07 (ref 0.00–1.49)
PROTHROMBIN TIME: 14.1 s (ref 11.6–15.2)

## 2015-09-15 LAB — HEPARIN LEVEL (UNFRACTIONATED): Heparin Unfractionated: 0.39 IU/mL (ref 0.30–0.70)

## 2015-09-15 LAB — TROPONIN I: Troponin I: 3.62 ng/mL (ref <0.031)

## 2015-09-15 MED ORDER — CLONAZEPAM 0.5 MG PO TABS
2.0000 mg | ORAL_TABLET | Freq: Every day | ORAL | Status: DC
  Administered 2015-09-15 – 2015-09-16 (×2): 2 mg via ORAL
  Filled 2015-09-15: qty 4
  Filled 2015-09-15: qty 2

## 2015-09-15 MED ORDER — POTASSIUM CHLORIDE CRYS ER 10 MEQ PO TBCR
10.0000 meq | EXTENDED_RELEASE_TABLET | Freq: Every day | ORAL | Status: DC
  Administered 2015-09-16: 12:00:00 10 meq via ORAL
  Filled 2015-09-15 (×2): qty 1

## 2015-09-15 MED ORDER — ASPIRIN EC 81 MG PO TBEC
81.0000 mg | DELAYED_RELEASE_TABLET | Freq: Every day | ORAL | Status: DC
Start: 2015-09-17 — End: 2015-09-17
  Administered 2015-09-17: 81 mg via ORAL
  Filled 2015-09-15: qty 1

## 2015-09-15 MED ORDER — HYDROXYZINE HCL 25 MG PO TABS
50.0000 mg | ORAL_TABLET | Freq: Every day | ORAL | Status: DC
  Administered 2015-09-16: 50 mg via ORAL
  Filled 2015-09-15 (×2): qty 2

## 2015-09-15 MED ORDER — SODIUM CHLORIDE 0.9 % WEIGHT BASED INFUSION
1.0000 mL/kg/h | INTRAVENOUS | Status: DC
  Administered 2015-09-16: 1 mL/kg/h via INTRAVENOUS

## 2015-09-15 MED ORDER — ONDANSETRON HCL 4 MG/2ML IJ SOLN: 4.0000 mg | Freq: Four times a day (QID) | INTRAMUSCULAR | Status: DC | PRN

## 2015-09-15 MED ORDER — CLONAZEPAM 0.5 MG PO TABS
1.0000 mg | ORAL_TABLET | Freq: Every day | ORAL | Status: DC
  Administered 2015-09-17: 09:00:00 1 mg via ORAL
  Filled 2015-09-15: qty 2

## 2015-09-15 MED ORDER — FISH OIL 1000 MG PO CAPS: 2.0000 | ORAL_CAPSULE | Freq: Every day | ORAL | Status: DC

## 2015-09-15 MED ORDER — ASPIRIN 81 MG PO CHEW
81.0000 mg | CHEWABLE_TABLET | ORAL | Status: AC
  Administered 2015-09-16: 81 mg via ORAL
  Filled 2015-09-15: qty 1

## 2015-09-15 MED ORDER — ASPIRIN EC 81 MG PO TBEC: 81.0000 mg | DELAYED_RELEASE_TABLET | Freq: Every day | ORAL | Status: DC

## 2015-09-15 MED ORDER — HEPARIN BOLUS VIA INFUSION
4000.0000 [IU] | Freq: Once | INTRAVENOUS | Status: AC
  Administered 2015-09-15: 4000 [IU] via INTRAVENOUS
  Filled 2015-09-15: qty 4000

## 2015-09-15 MED ORDER — HYDROXYZINE HCL 25 MG PO TABS
75.0000 mg | ORAL_TABLET | Freq: Every day | ORAL | Status: DC
  Administered 2015-09-15 – 2015-09-16 (×2): 75 mg via ORAL
  Filled 2015-09-15 (×2): qty 3

## 2015-09-15 MED ORDER — LORATADINE 10 MG PO TABS
10.0000 mg | ORAL_TABLET | Freq: Every day | ORAL | Status: DC
  Administered 2015-09-16: 10 mg via ORAL
  Filled 2015-09-15 (×2): qty 1

## 2015-09-15 MED ORDER — LORATADINE 10 MG PO CAPS: 10.0000 mg | ORAL_CAPSULE | Freq: Every day | ORAL | Status: DC

## 2015-09-15 MED ORDER — HEPARIN (PORCINE) IN NACL 100-0.45 UNIT/ML-% IJ SOLN
1300.0000 [IU]/h | INTRAMUSCULAR | Status: DC
  Administered 2015-09-15 – 2015-09-16 (×2): 1300 [IU]/h via INTRAVENOUS
  Filled 2015-09-15 (×2): qty 250

## 2015-09-15 MED ORDER — SODIUM CHLORIDE 0.9 % IJ SOLN: 3.0000 mL | Freq: Two times a day (BID) | INTRAMUSCULAR | Status: DC

## 2015-09-15 MED ORDER — SODIUM CHLORIDE 0.9 % IJ SOLN: 3.0000 mL | INTRAMUSCULAR | Status: DC | PRN

## 2015-09-15 MED ORDER — OMEGA-3-ACID ETHYL ESTERS 1 G PO CAPS
2.0000 g | ORAL_CAPSULE | Freq: Every day | ORAL | Status: DC
Start: 2015-09-16 — End: 2015-09-17
  Administered 2015-09-16: 2 g via ORAL
  Filled 2015-09-15 (×2): qty 2

## 2015-09-15 MED ORDER — LOSARTAN POTASSIUM 50 MG PO TABS
50.0000 mg | ORAL_TABLET | Freq: Every day | ORAL | Status: DC
  Administered 2015-09-16 – 2015-09-17 (×2): 50 mg via ORAL
  Filled 2015-09-15 (×2): qty 1

## 2015-09-15 MED ORDER — METOPROLOL TARTRATE 25 MG PO TABS
25.0000 mg | ORAL_TABLET | Freq: Two times a day (BID) | ORAL | Status: DC
  Filled 2015-09-15: qty 1

## 2015-09-15 MED ORDER — CLONAZEPAM 1 MG PO TABS: 1.0000 mg | ORAL_TABLET | Freq: Two times a day (BID) | ORAL | Status: DC

## 2015-09-15 MED ORDER — SIMVASTATIN 20 MG PO TABS
40.0000 mg | ORAL_TABLET | Freq: Every evening | ORAL | Status: DC
  Administered 2015-09-15: 40 mg via ORAL
  Filled 2015-09-15: qty 1

## 2015-09-15 MED ORDER — SODIUM CHLORIDE 0.9 % IV SOLN: 250.0000 mL | INTRAVENOUS | Status: DC | PRN

## 2015-09-15 MED ORDER — SODIUM CHLORIDE 0.9 % WEIGHT BASED INFUSION
3.0000 mL/kg/h | INTRAVENOUS | Status: DC
  Administered 2015-09-16: 3 mL/kg/h via INTRAVENOUS

## 2015-09-15 MED ORDER — NITROGLYCERIN 0.4 MG SL SUBL: 0.4000 mg | SUBLINGUAL_TABLET | SUBLINGUAL | Status: DC | PRN

## 2015-09-15 MED ORDER — HYDROCHLOROTHIAZIDE 12.5 MG PO CAPS
12.5000 mg | ORAL_CAPSULE | Freq: Every day | ORAL | Status: DC
  Administered 2015-09-16 – 2015-09-17 (×2): 12.5 mg via ORAL
  Filled 2015-09-15 (×2): qty 1

## 2015-09-15 MED ORDER — ACETAMINOPHEN 325 MG PO TABS
650.0000 mg | ORAL_TABLET | ORAL | Status: DC | PRN
Start: 2015-09-15 — End: 2015-09-17

## 2015-09-15 MED ORDER — PAROXETINE HCL 20 MG PO TABS
40.0000 mg | ORAL_TABLET | Freq: Every evening | ORAL | Status: DC
  Administered 2015-09-15 – 2015-09-16 (×2): 40 mg via ORAL
  Filled 2015-09-15 (×4): qty 2

## 2015-09-15 MED ORDER — HYDROXYZINE PAMOATE 25 MG PO CAPS: 25.0000 mg | ORAL_CAPSULE | Freq: Three times a day (TID) | ORAL | Status: DC

## 2015-09-15 MED ORDER — HYDROXYZINE HCL 25 MG PO TABS
25.0000 mg | ORAL_TABLET | Freq: Every day | ORAL | Status: DC
  Administered 2015-09-16 – 2015-09-17 (×2): 25 mg via ORAL
  Filled 2015-09-15 (×2): qty 1

## 2015-09-15 MED ORDER — VITAMIN B-12 100 MCG PO TABS
100.0000 ug | ORAL_TABLET | Freq: Every day | ORAL | Status: DC
  Filled 2015-09-15 (×2): qty 1

## 2015-09-15 MED ORDER — LOSARTAN POTASSIUM-HCTZ 50-12.5 MG PO TABS: 1.0000 | ORAL_TABLET | Freq: Every day | ORAL | Status: DC

## 2015-09-15 NOTE — ED Notes (Signed)
Per EMS: was watching TV.  Onset of CP, recurrent (has had indigestion symptoms in past), cp began to radiate to jaw, left arm.  Jaw pain rated 3/10, chest pain 1/10.  Initially hypertensive 180/112.  Self administered 324 of EC asa (chewed).  Was given 1 ntg with no relief.  Has been out of simvastatin for 3 days.  Hx of htn, hyperlipidemia, borderline diabetic, smoking.  EMS 12 lead unremarkable.

## 2015-09-15 NOTE — ED Provider Notes (Signed)
CSN: [id number]     Arrival date & time 09/15/15  1003 History   First MD Initiated Contact with Patient 09/15/15 1014     Chief Complaint  Patient presents with  . Chest Pain     (Consider location/radiation/quality/duration/timing/severity/associated sxs/prior Treatment) HPI   Patient is a 64 year old male with history of hypertension hyperlipidemia, current smoker, history of IBS, anxiety, depression. He is presenting today with chest pain. Patient was in his normal state health when he developed acute chest pain radiating to his jaw. Patient took 325 of aspirin at home. He took his blood pressure and found to be elevated he was concerned and therefore he called EMS.  Patient last saw a cardiologist 15 years ago.  Past Medical History  Diagnosis Date  . Hypertension   . Tobacco abuse   . PVCs (premature ventricular contractions)   . Anxiety   . Depression   . BPH (benign prostatic hypertrophy)   . Hypokalemia   . Hematuria   . Substance abuse   . IBS (irritable bowel syndrome)    Past Surgical History  Procedure Laterality Date  . Cholecystectomy     Family History  Problem Relation Age of Onset  . Alzheimer's disease Mother   . CAD Father   . Depression Father   . Anxiety disorder Mother   . High blood pressure Mother    Social History  Substance Use Topics  . Smoking status: Current Every Day Smoker -- 1.00 packs/day for 50 years    Types: Cigarettes    Start date: 02/20/1972  . Smokeless tobacco: Never Used  . Alcohol Use: No     Comment: Hx of EtOH abuse, quit ~35 years ago    Review of Systems  Constitutional: Negative for fever, diaphoresis and activity change.  HENT: Negative for drooling and hearing loss.   Eyes: Negative for discharge.  Respiratory: Negative for cough and shortness of breath.   Cardiovascular: Positive for chest pain.  Gastrointestinal: Negative for abdominal pain.  Genitourinary: Negative for dysuria and urgency.   Musculoskeletal: Negative for arthralgias.  Allergic/Immunologic: Negative for immunocompromised state.  Neurological: Negative for speech difficulty.  Psychiatric/Behavioral: Negative for behavioral problems and agitation.  All other systems reviewed and are negative.     Allergies  Review of patient's allergies indicates no known allergies.  Home Medications   Prior to Admission medications   Medication Sig Start Date End Date Taking? Authorizing Provider  ALPRAZolam Duanne Moron) 1 MG tablet Take 1 mg by mouth 3 (three) times daily as needed.    Historical Provider, MD  clonazePAM Bobbye Charleston) 1 MG tablet  12/05/13   Historical Provider, MD  HYDROcodone-acetaminophen (NORCO/VICODIN) 5-325 MG per tablet Take 1 tablet by mouth every 4 (four) hours as needed. 12/24/13   Donnie Mesa, MD  hydrOXYzine (VISTARIL) 25 MG capsule  01/02/14   Historical Provider, MD  losartan (COZAAR) 50 MG tablet Take 1 tablet (50 mg total) by mouth daily. 02/21/13   Janece Canterbury, MD  losartan-hydrochlorothiazide [facility]) 50-12.5 MG per tablet  10/10/13   Historical Provider, MD  metoprolol tartrate (LOPRESSOR) 25 MG tablet Take 25 mg by mouth daily.    Historical Provider, MD  simvastatin (ZOCOR) 40 MG tablet Take 40 mg by mouth every evening.    Historical Provider, MD   BP 158/95 mmHg  Pulse 55  Temp(Src) 98 F (36.7 C) (Oral)  Resp 14  Ht 5\' 10"  (1.778 m)  Wt 205 lb (92.987 kg)  BMI 29.41 kg/m2  SpO2  95% Physical Exam  Constitutional: He is oriented to person, place, and time. He appears well-nourished.  HENT:  Head: Normocephalic.  Mouth/Throat: Oropharynx is clear and moist.  Eyes: Conjunctivae are normal.  Neck: No tracheal deviation present.  Cardiovascular: Normal rate.   Pulmonary/Chest: Effort normal. No stridor. No respiratory distress. He has no wheezes. He has no rales.  Abdominal: Soft. There is no tenderness. There is no guarding.  Musculoskeletal: Normal range of motion. He exhibits no  edema.  Neurological: He is oriented to person, place, and time. No cranial nerve deficit.  Skin: Skin is warm and dry. No rash noted. He is not diaphoretic.  Psychiatric: He has a normal mood and affect. His behavior is normal.  Nursing note and vitals reviewed.   ED Course  Procedures (including critical care time) Labs Review Labs Reviewed  BASIC METABOLIC PANEL  CBC  PROTIME-INR  LIPASE, BLOOD  I-STAT [place], ED    Imaging Review No results found. I have personally reviewed and evaluated these images and lab results as part of my medical decision-making.   EKG Interpretation   Date/Time:  Thursday September 15 2015 10:31:01 EDT Ventricular Rate:  55 PR Interval:  180 QRS Duration: 90 QT Interval:  457 QTC Calculation: 437 R Axis:   41 Text Interpretation:  Sinus rhythm Low voltage, precordial leads  Anteroseptal infarct, old Borderline repolarization abnormality T wave  inversion in lead 3, unchanged from prior.  No significant change since  last tracing Confirmed by Gerald Leitz ([id number]) on 09/15/2015 10:36:34  AM      MDM   Final diagnoses:  None    Patient is a 64 year old male with history of hypertension, prediabetes, current smoker resenting today with chest pain radiating to his jaw. This is now resolved. Patient's last risk stratification was 15 years ago.  Patient's heart score is 6. Patient has a highly suspicious story given chest pain radiating to the jaw. He is a smoker and has hypertension. He has not had risk assessment for a patient the last 15 years. I think would be reasonable to risk stratify. When troponin comes back we will discuss with cardiology.   Have not heard from cardiology but admit order has been placed by them.  Jaylie Neaves Julio Alm, MD 09/15/15 1344

## 2015-09-15 NOTE — Progress Notes (Signed)
[place] for heparin Indication: chest pain/ACS  No Known Allergies   Labs:  Recent Labs  09/15/15 1113 09/15/15 1800 09/15/15 2055  HGB 13.3  --   --   HCT 39.6  --   --   PLT 185  --   --   LABPROT 14.1  --   --   INR 1.07  --   --   HEPARINUNFRC  --   --  0.39  CREATININE 1.23  --   --   TROPONINI  --  3.62*  --     Estimated Creatinine Clearance: 70.4 mL/min (by C-G formula based on Cr of 1.23).  Assessment: 64 yo M in ED with CP.  Pharmacy consulted to dose heparin for ACS/STEMI.  Pt with CP and mildly elevated troponin.  PM Heparin level therapeutic Cath planned for tomorrow  Goal of Therapy:  Heparin level 0.3-0.7 units/ml Monitor platelets by anticoagulation protocol: Yes   Plan:  Continue heparin at 1300 units / hr Follow up AM labs  Thank you Anette Guarneri, PharmD [phone] 09/15/2015 9:39 PM

## 2015-09-15 NOTE — H&P (Signed)
Patient ID: Joseph Fitzpatrick MRN: [id number], DOB/AGE: [birth year]   Admit date: 09/15/2015   Primary Physician: Gerrit Heck, MD Primary Cardiologist: new (seen by Dr. Tamala Julian 20 yrs ago)  Pt. Profile:  patient is obese 64 year old Caucasian male with past medical history of HTN, HLD, borderline DM, tobacco abuse, remote substance abuse (clean now) and anxiety/depression presented with CP  Problem List  Past Medical History  Diagnosis Date  . Hypertension   . Tobacco abuse   . PVCs (premature ventricular contractions)   . Anxiety   . Depression   . BPH (benign prostatic hypertrophy)   . Hypokalemia   . Hematuria   . Substance abuse   . IBS (irritable bowel syndrome)     Past Surgical History  Procedure Laterality Date  . Cholecystectomy       Allergies  No Known Allergies  HPI  The patient is obese 63 year old Caucasian male with past medical history of HTN, HLD, borderline DM, tobacco abuse, remote substance abuse (clean now) and anxiety/depression. He was seen by Dr. Tamala Julian more than 20 years ago for chest discomfort and deemed noncardiac in nature. He has since retired from being a Therapist, nutritional. He does not do any strenuous activity. He has smoked cigarettes since age 35, however has been cutting back from previous one pack per day down to one pack per week. He has been compliant with his blood pressure medication, however did run out of his simvastatin 4 days ago. He denies any recent lower extremity edema, orthopnea or paroxysmal nocturnal dyspnea.  He was in his usual state of health until the AM of 09/15/2015. He took his morning blood pressure medication, roughly an hour later around 8:30 AM, while getting up from watching TV he started noticing bilateral jaw pain. Later he had burning sensation in his chest that is different from his usual acid reflux symptom. He measured his blood pressure which came back 180/100. He denies any shortness of breath or  diaphoresis. He took 325 mg aspirin at home. However the symptom was concerning, prompting the patient to seek medical attention at [facility]. He was given additional nitroglycerin on arrival and the symptom went away after a hour. Significant laboratory finding on arrival include creatinine of 1.23, normal CBC, point-of-care troponin was mildly elevated at 0.14. Chest x-ray was negative. EKG showed poor R wave progression in anterior lead and also T wave inversion in the inferior lead. Cardiology has been consulted for chest pain.  Medications  Prior to Admission medications   Medication Sig Start Date End Date Taking? Authorizing Provider  clonazePAM (KLONOPIN) 1 MG tablet Take 1-2 mg by mouth 2 (two) times daily. Pt takes 1mg  in am and 2mg  in pm 12/05/13  Yes Historical Provider, MD  hydrOXYzine (VISTARIL) 25 MG capsule Take 25-75 mg by mouth 3 (three) times daily. Pt takes 25mg  in am, 50mg  in afternoon and 75mg  at bedtime 01/02/14  Yes Historical Provider, MD  KLOR-CON M10 10 MEQ tablet Take 10 mEq by mouth daily. 09/06/15  Yes Historical Provider, MD  Loratadine (CLARITIN) 10 MG CAPS Take 10 mg by mouth daily.   Yes Historical Provider, MD  losartan-hydrochlorothiazide (HYZAAR) 50-12.5 MG per tablet Take 1 tablet by mouth daily.  10/10/13  Yes Historical Provider, MD  metoprolol tartrate (LOPRESSOR) 25 MG tablet Take 25 mg by mouth daily.   Yes Historical Provider, MD  Omega-3 Fatty Acids (FISH OIL) 1000 MG CAPS Take 2 capsules by mouth daily.   Yes  Historical Provider, MD  PARoxetine (PAXIL) 40 MG tablet Take 40 mg by mouth every evening. 08/28/15  Yes Historical Provider, MD  simvastatin (ZOCOR) 40 MG tablet Take 40 mg by mouth every evening.   Yes Historical Provider, MD  vitamin B-12 (CYANOCOBALAMIN) 100 MCG tablet Take 100 mcg by mouth daily.   Yes Historical Provider, MD  HYDROcodone-acetaminophen (NORCO/VICODIN) 5-325 MG per tablet Take 1 tablet by mouth every 4 (four) hours as  needed. Patient not taking: Reported on 09/15/2015 12/24/13   Donnie Mesa, MD  losartan (COZAAR) 50 MG tablet Take 1 tablet (50 mg total) by mouth daily. Patient not taking: Reported on 09/15/2015 02/21/13   Janece Canterbury, MD    Family History  Family History  Problem Relation Age of Onset  . Alzheimer's disease Mother   . CAD Father     had 2 PCI after age 51  . Depression Father   . Anxiety disorder Mother   . High blood pressure Mother     Social History  Social History   Social History  . Marital Status: Married    Spouse Name: N/A  . Number of Children: N/A  . Years of Education: N/A   Occupational History  . Not on file.   Social History Main Topics  . Smoking status: Current Every Day Smoker -- 1.00 packs/day for 50 years    Types: Cigarettes    Start date: 02/20/1972  . Smokeless tobacco: Never Used     Comment: cut back from 1ppd to 0.5 ppd, smoked since age 50  . Alcohol Use: No     Comment: Hx of EtOH abuse, quit ~35 years ago  . Drug Use: Yes    Special: Marijuana     Comment: had marijuana x 2 in 2014, did LSD in 70's  . Sexual Activity: No   Other Topics Concern  . Not on file   Social History Narrative   Lives with his wife and 19 year old son.  Normally ambulates without assistive device, drives, completes ADLs without difficulty.       Review of Systems General:  No chills, fever, night sweats or weight changes.  Cardiovascular:  No dyspnea on exertion, edema, orthopnea, palpitations, paroxysmal nocturnal dyspnea. +chest pain and jaw pain Dermatological: No rash, lesions/masses Respiratory: No cough, dyspnea Urologic: No hematuria, dysuria Abdominal:   No nausea, vomiting, diarrhea, bright red blood per rectum, melena, or hematemesis Neurologic:  No visual changes, wkns, changes in mental status. All other systems reviewed and are otherwise negative except as noted above.  Physical Exam  Blood pressure 135/71, pulse 50, temperature 98  F (36.7 C), temperature source Oral, resp. rate 10, height 5\' 10"  (1.778 m), weight 205 lb (92.987 kg), SpO2 99 %.  General: Pleasant, NAD Psych: Normal affect. Neuro: Alert and oriented X 3. Moves all extremities spontaneously. HEENT: Normal  Neck: Supple without bruits or JVD. Lungs:  Resp regular and unlabored, CTA. Heart: RRR no s3, s4, or murmurs. Abdomen: Soft, non-tender, non-distended, BS + x 4.  Extremities: No clubbing, cyanosis or edema. DP/PT/Radials 2+ and equal bilaterally.  Labs  Troponin [facility] of Care Test)  Recent Labs  09/15/15 1111  TROPIPOC 0.14*   No results for input(s): CKTOTAL, CKMB, TROPONINI in the last 72 hours. Lab Results  Component Value Date   WBC 9.9 09/15/2015   HGB 13.3 09/15/2015   HCT 39.6 09/15/2015   MCV 90.6 09/15/2015   PLT 185 09/15/2015     Recent Labs Lab 09/15/15  1113  NA 139  K 4.1  CL 103  CO2 29  BUN 13  CREATININE 1.23  CALCIUM 9.1  GLUCOSE 118*   Lab Results  Component Value Date   CHOL  10/03/2007    192        ATP III CLASSIFICATION:  <200     mg/dL   Desirable  200-239  mg/dL   Borderline High  >=240    mg/dL   High   HDL 27* 10/03/2007   LDLCALC * 10/03/2007    140        Total Cholesterol/HDL:CHD Risk Coronary Heart Disease Risk Table                     Men   Women  1/2 Average Risk   3.4   3.3   TRIG 124 10/03/2007   Lab Results  Component Value Date   DDIMER  10/02/2007    0.22        AT THE INHOUSE ESTABLISHED CUTOFF VALUE OF 0.48 ug/mL FEU, THIS ASSAY HAS BEEN DOCUMENTED IN THE LITERATURE TO HAVE     Radiology/Studies  Dg Chest 2 View  09/15/2015  CLINICAL DATA:  Chest pain with nausea EXAM: CHEST  2 VIEW COMPARISON:  02/19/2015 FINDINGS: Normal heart size and mediastinal contours. No acute infiltrate or edema. Calcified granuloma in the right upper lobe. No effusion or pneumothorax. No acute osseous findings. IMPRESSION: No active cardiopulmonary disease. Electronically Signed   By:  Monte Fantasia M.D.   On: 09/15/2015 11:29    ECG  NSR with TWI in inferior lead and poor R wave progression in anterior lead  ASSESSMENT AND PLAN  1. Chest pain with mildly elevated trop  - Cr 1.23, was hypertensive during CP which is suprising to the pt as his BP is usually well controlled after taking BP meds. POC mildly elevated 0.14. EKG TWI in inferior lead  - symptom concerning for unstable angina, he has multiple risk factors include HTN, HLD, ?preDM, 50 yrs smoking history  - will admit to cardiology, trend trop overnight, and plan for cardiac catheterization tomorrow to definitively r/o CAD  - Risk and benefit of procedure explained to the patient who display clear understanding and agree to proceed. Discussed with patient possible procedural risk include bleeding, vascular injury, renal injury, arrythmia, MI, stroke and loss of limb or life.  2. HTN  3. HLD: obtain Lipid Panel  4. Borderline DM: obtain Hgb A1C  5. Tobacco abuse  6. Depression/Anxiety  Signed, Almyra Deforest, PA-C 09/15/2015, 12:48 PM Patient seen and examined. I agree with the assessment and plan as detailed above. See also my additional thoughts below.   The patient's chest pain was short-lived but sounds quite concerning. His point-of-care troponin is abnormal. Other troponins are to follow. EKG is abnormal. However there is no diagnostic acute change since his prior tracing. I agree with plans for admission, heparinization, and cardiac catheterization.  Dola Argyle, MD, [facility] 09/15/2015 1:21 PM

## 2015-09-15 NOTE — Progress Notes (Signed)
ANTICOAGULATION CONSULT NOTE - Initial Consult  Pharmacy Consult for heparin Indication: chest pain/ACS  No Known Allergies  Patient Measurements: Height: 5\' 10"  (177.8 cm) Weight: 205 lb (92.987 kg) IBW/kg (Calculated) : 73 Heparin Dosing Weight: 91.8 kg Vital Signs: Temp: 98 F (36.7 C) (10/20 1012) Temp Source: Oral (10/20 1012) BP: 144/82 mmHg (10/20 1315) Pulse Rate: 55 (10/20 1315)  Labs:  Recent Labs  09/15/15 1113  HGB 13.3  HCT 39.6  PLT 185  LABPROT 14.1  INR 1.07  CREATININE 1.23    Estimated Creatinine Clearance: 70.4 mL/min (by C-G formula based on Cr of 1.23).   Medical History: Past Medical History  Diagnosis Date  . Hypertension   . Tobacco abuse   . PVCs (premature ventricular contractions)   . Anxiety   . Depression   . BPH (benign prostatic hypertrophy)   . Hypokalemia   . Hematuria   . Substance abuse   . IBS (irritable bowel syndrome)      Assessment: 64 yo M in ED with CP.  Pharmacy consulted to dose heparin for ACS/STEMI.  Pt with CP and mildly elevated troponin.  To be admitted for cardiac work up including cardiac cath. Wt 93 kg, creat 1.23, CBC WNL.  Goal of Therapy:  Heparin level 0.3-0.7 units/ml Monitor platelets by anticoagulation protocol: Yes   Plan:  Give 4000 units bolus x 1 Start heparin infusion at 1300 units/hr Check anti-Xa level in 6 hours and daily while on heparin Continue to monitor H&H and platelets Scheduled for cardiac cath 10/21 at 0900 am  Eudelia Bunch, Pharm.D. [phone] 09/15/2015 2:13 PM

## 2015-09-15 NOTE — ED Notes (Signed)
Report attempted 

## 2015-09-16 ENCOUNTER — Encounter (HOSPITAL_COMMUNITY)
Admission: EM | Disposition: A | Discharge status: Home / Self Care | Payer: Self-pay | Source: Home / Self Care | Attending: Cardiology

## 2015-09-16 ENCOUNTER — Encounter (HOSPITAL_COMMUNITY): Payer: Self-pay | Admitting: General Practice

## 2015-09-16 DIAGNOSIS — F17200 Nicotine dependence, unspecified, uncomplicated: Secondary | ICD-10-CM | POA: Diagnosis present

## 2015-09-16 DIAGNOSIS — R7303 Prediabetes: Secondary | ICD-10-CM | POA: Diagnosis present

## 2015-09-16 DIAGNOSIS — Z6829 Body mass index (BMI) 29.0-29.9, adult: Secondary | ICD-10-CM | POA: Diagnosis not present

## 2015-09-16 DIAGNOSIS — N400 Enlarged prostate without lower urinary tract symptoms: Secondary | ICD-10-CM | POA: Diagnosis present

## 2015-09-16 DIAGNOSIS — R001 Bradycardia, unspecified: Secondary | ICD-10-CM | POA: Diagnosis not present

## 2015-09-16 DIAGNOSIS — E7800 Pure hypercholesterolemia, unspecified: Secondary | ICD-10-CM | POA: Diagnosis present

## 2015-09-16 DIAGNOSIS — I214 Non-ST elevation (NSTEMI) myocardial infarction: Secondary | ICD-10-CM | POA: Diagnosis present

## 2015-09-16 DIAGNOSIS — Z79891 Long term (current) use of opiate analgesic: Secondary | ICD-10-CM | POA: Diagnosis not present

## 2015-09-16 DIAGNOSIS — Z79899 Other long term (current) drug therapy: Secondary | ICD-10-CM | POA: Diagnosis not present

## 2015-09-16 DIAGNOSIS — I209 Angina pectoris, unspecified: Secondary | ICD-10-CM | POA: Diagnosis present

## 2015-09-16 DIAGNOSIS — F329 Major depressive disorder, single episode, unspecified: Secondary | ICD-10-CM | POA: Diagnosis present

## 2015-09-16 DIAGNOSIS — I2510 Atherosclerotic heart disease of native coronary artery without angina pectoris: Secondary | ICD-10-CM

## 2015-09-16 DIAGNOSIS — K589 Irritable bowel syndrome without diarrhea: Secondary | ICD-10-CM | POA: Diagnosis present

## 2015-09-16 DIAGNOSIS — N183 Chronic kidney disease, stage 3 (moderate): Secondary | ICD-10-CM | POA: Diagnosis present

## 2015-09-16 DIAGNOSIS — E669 Obesity, unspecified: Secondary | ICD-10-CM | POA: Diagnosis present

## 2015-09-16 DIAGNOSIS — I129 Hypertensive chronic kidney disease with stage 1 through stage 4 chronic kidney disease, or unspecified chronic kidney disease: Secondary | ICD-10-CM | POA: Diagnosis present

## 2015-09-16 DIAGNOSIS — F419 Anxiety disorder, unspecified: Secondary | ICD-10-CM | POA: Diagnosis present

## 2015-09-16 DIAGNOSIS — E785 Hyperlipidemia, unspecified: Secondary | ICD-10-CM | POA: Diagnosis present

## 2015-09-16 HISTORY — PX: CARDIAC CATHETERIZATION: SHX172

## 2015-09-16 LAB — LIPID PANEL
Cholesterol: 195 mg/dL (ref 0–200)
HDL: 39 mg/dL — AB (ref >40)
LDL CALC: 141 mg/dL — AB (ref 0–99)
Total CHOL/HDL Ratio: 5 RATIO
Triglycerides: 74 mg/dL (ref <150)
VLDL: 15 mg/dL (ref 0–40)

## 2015-09-16 LAB — CBC
HCT: 41.2 % (ref 39.0–52.0)
HEMOGLOBIN: 13.9 g/dL (ref 13.0–17.0)
MCH: 30.5 pg (ref 26.0–34.0)
MCHC: 33.7 g/dL (ref 30.0–36.0)
MCV: 90.5 fL (ref 78.0–100.0)
PLATELETS: 190 10*3/uL (ref 150–400)
RBC: 4.55 MIL/uL (ref 4.22–5.81)
RDW: 13.3 % (ref 11.5–15.5)
WBC: 7.9 10*3/uL (ref 4.0–10.5)

## 2015-09-16 LAB — BASIC METABOLIC PANEL
Anion gap: 10 (ref 5–15)
BUN: 11 mg/dL (ref 6–20)
CALCIUM: 8.8 mg/dL — AB (ref 8.9–10.3)
CO2: 27 mmol/L (ref 22–32)
Chloride: 98 mmol/L — ABNORMAL LOW (ref 101–111)
Creatinine, Ser: 1.08 mg/dL (ref 0.61–1.24)
GFR calc Af Amer: >60 mL/min (ref >60)
Glucose, Bld: 104 mg/dL — ABNORMAL HIGH (ref 65–99)
POTASSIUM: 3.6 mmol/L (ref 3.5–5.1)
SODIUM: 135 mmol/L (ref 135–145)

## 2015-09-16 LAB — POCT ACTIVATED CLOTTING TIME: Activated Clotting Time: 571 seconds

## 2015-09-16 LAB — HEPARIN LEVEL (UNFRACTIONATED): HEPARIN UNFRACTIONATED: 0.53 [IU]/mL (ref 0.30–0.70)

## 2015-09-16 LAB — TROPONIN I
TROPONIN I: 4.26 ng/mL — AB (ref <0.031)
Troponin I: 3.46 ng/mL (ref <0.031)

## 2015-09-16 SURGERY — LEFT HEART CATH AND CORONARY ANGIOGRAPHY
Anesthesia: LOCAL

## 2015-09-16 MED ORDER — NITROGLYCERIN 1 MG/10 ML FOR IR/CATH LAB
INTRA_ARTERIAL | Status: AC
  Filled 2015-09-16: qty 10

## 2015-09-16 MED ORDER — METOPROLOL TARTRATE 12.5 MG HALF TABLET
12.5000 mg | ORAL_TABLET | Freq: Two times a day (BID) | ORAL | Status: DC
  Administered 2015-09-16 – 2015-09-17 (×3): 12.5 mg via ORAL
  Filled 2015-09-16 (×3): qty 1

## 2015-09-16 MED ORDER — NITROGLYCERIN 1 MG/10 ML FOR IR/CATH LAB
INTRA_ARTERIAL | Status: DC | PRN
Start: 2015-09-16 — End: 2015-09-16
  Administered 2015-09-16: 200 ug via INTRACORONARY

## 2015-09-16 MED ORDER — LOSARTAN POTASSIUM 50 MG PO TABS: 50.0000 mg | ORAL_TABLET | Freq: Every day | ORAL | Status: DC

## 2015-09-16 MED ORDER — BIVALIRUDIN 250 MG IV SOLR
INTRAVENOUS | Status: AC
  Filled 2015-09-16: qty 250

## 2015-09-16 MED ORDER — VERAPAMIL HCL 2.5 MG/ML IV SOLN
INTRAVENOUS | Status: AC
  Filled 2015-09-16: qty 2

## 2015-09-16 MED ORDER — SODIUM CHLORIDE 0.9 % WEIGHT BASED INFUSION
3.0000 mL/kg/h | INTRAVENOUS | Status: AC
  Administered 2015-09-16: 12:00:00 3 mL/kg/h via INTRAVENOUS

## 2015-09-16 MED ORDER — BIVALIRUDIN BOLUS VIA INFUSION - CUPID
INTRAVENOUS | Status: DC | PRN
  Administered 2015-09-16: 68.55 mg via INTRAVENOUS

## 2015-09-16 MED ORDER — SODIUM CHLORIDE 0.9 % IV SOLN: 250.0000 mL | INTRAVENOUS | Status: DC | PRN

## 2015-09-16 MED ORDER — TICAGRELOR 90 MG PO TABS
ORAL_TABLET | ORAL | Status: DC | PRN
  Administered 2015-09-16: 180 mg via ORAL

## 2015-09-16 MED ORDER — HEPARIN SODIUM (PORCINE) 1000 UNIT/ML IJ SOLN
INTRAMUSCULAR | Status: DC | PRN
  Administered 2015-09-16: 4500 [IU] via INTRAVENOUS

## 2015-09-16 MED ORDER — HEPARIN SODIUM (PORCINE) 1000 UNIT/ML IJ SOLN
INTRAMUSCULAR | Status: AC
  Filled 2015-09-16: qty 1

## 2015-09-16 MED ORDER — NITROGLYCERIN 1 MG/10 ML FOR IR/CATH LAB
INTRA_ARTERIAL | Status: DC | PRN
  Administered 2015-09-16: 10:00:00

## 2015-09-16 MED ORDER — ADENOSINE 12 MG/4ML IV SOLN
16.0000 mL | Freq: Once | INTRAVENOUS | Status: DC
  Filled 2015-09-16: qty 16

## 2015-09-16 MED ORDER — SODIUM CHLORIDE 0.9 % IV SOLN
250.0000 mg | INTRAVENOUS | Status: DC | PRN
  Administered 2015-09-16: 1.75 mg/kg/h via INTRAVENOUS

## 2015-09-16 MED ORDER — ATORVASTATIN CALCIUM 80 MG PO TABS
80.0000 mg | ORAL_TABLET | Freq: Every day | ORAL | Status: DC
  Administered 2015-09-16: 80 mg via ORAL
  Filled 2015-09-16: qty 1

## 2015-09-16 MED ORDER — MIDAZOLAM HCL 2 MG/2ML IJ SOLN
INTRAMUSCULAR | Status: AC
  Filled 2015-09-16: qty 4

## 2015-09-16 MED ORDER — SODIUM CHLORIDE 0.9 % IJ SOLN: 3.0000 mL | INTRAMUSCULAR | Status: DC | PRN

## 2015-09-16 MED ORDER — VERAPAMIL HCL 2.5 MG/ML IV SOLN
INTRAVENOUS | Status: DC | PRN
  Administered 2015-09-16: 10 mL via INTRA_ARTERIAL

## 2015-09-16 MED ORDER — FENTANYL CITRATE (PF) 100 MCG/2ML IJ SOLN
INTRAMUSCULAR | Status: AC
  Filled 2015-09-16: qty 4

## 2015-09-16 MED ORDER — LIDOCAINE HCL (PF) 1 % IJ SOLN
INTRAMUSCULAR | Status: AC
  Filled 2015-09-16: qty 30

## 2015-09-16 MED ORDER — IOHEXOL 350 MG/ML SOLN
INTRAVENOUS | Status: DC | PRN
  Administered 2015-09-16: 165 mL via INTRAVENOUS

## 2015-09-16 MED ORDER — TICAGRELOR 90 MG PO TABS
90.0000 mg | ORAL_TABLET | Freq: Two times a day (BID) | ORAL | Status: DC
  Administered 2015-09-16 – 2015-09-17 (×2): 90 mg via ORAL
  Filled 2015-09-16 (×2): qty 1

## 2015-09-16 MED ORDER — SODIUM CHLORIDE 0.9 % IJ SOLN: 3.0000 mL | Freq: Two times a day (BID) | INTRAMUSCULAR | Status: DC

## 2015-09-16 MED ORDER — FENTANYL CITRATE (PF) 100 MCG/2ML IJ SOLN
INTRAMUSCULAR | Status: DC | PRN
  Administered 2015-09-16 (×2): 25 ug via INTRAVENOUS

## 2015-09-16 MED ORDER — AMLODIPINE BESYLATE 5 MG PO TABS
5.0000 mg | ORAL_TABLET | Freq: Every day | ORAL | Status: DC
  Administered 2015-09-16 – 2015-09-17 (×2): 5 mg via ORAL
  Filled 2015-09-16 (×2): qty 1

## 2015-09-16 MED ORDER — HEPARIN (PORCINE) IN NACL 2-0.9 UNIT/ML-% IJ SOLN
INTRAMUSCULAR | Status: AC
  Filled 2015-09-16: qty 1000

## 2015-09-16 MED ORDER — MIDAZOLAM HCL 2 MG/2ML IJ SOLN
INTRAMUSCULAR | Status: DC | PRN
  Administered 2015-09-16 (×2): 1 mg via INTRAVENOUS

## 2015-09-16 SURGICAL SUPPLY — 21 items
BALLN EMERGE MR 2.0X15 (BALLOONS) ×3
BALLN ~~LOC~~ EMERGE MR 2.5X20 (BALLOONS) ×3
BALLOON EMERGE MR 2.0X15 (BALLOONS) IMPLANT
BALLOON ~~LOC~~ EMERGE MR 2.5X20 (BALLOONS) IMPLANT
CATH INFINITI 5 FR JL3.5 (CATHETERS) ×3 IMPLANT
CATH INFINITI 5FR ANG PIGTAIL (CATHETERS) ×3 IMPLANT
CATH INFINITI JR4 5F (CATHETERS) ×3 IMPLANT
CATH VISTA GUIDE 6FR XBRCA (CATHETERS) ×1 IMPLANT
DEVICE RAD COMP TR BAND LRG (VASCULAR PRODUCTS) ×3 IMPLANT
GLIDESHEATH SLEND SS 6F .021 (SHEATH) ×3 IMPLANT
GUIDE CATH RUNWAY 6FR CLS3 (CATHETERS) ×1 IMPLANT
KIT ENCORE 26 ADVANTAGE (KITS) ×1 IMPLANT
KIT HEART LEFT (KITS) ×3 IMPLANT
PACK CARDIAC CATHETERIZATION (CUSTOM PROCEDURE TRAY) ×3 IMPLANT
STENT PROMUS PREM MR 2.25X32 (Permanent Stent) ×1 IMPLANT
SYR MEDRAD MARK V 150ML (SYRINGE) ×3 IMPLANT
TRANSDUCER W/STOPCOCK (MISCELLANEOUS) ×3 IMPLANT
TUBING CIL FLEX 10 FLL-RA (TUBING) ×3 IMPLANT
WIRE ASAHI PROWATER 180CM (WIRE) ×2 IMPLANT
WIRE PRESSURE VERRATA (WIRE) ×1 IMPLANT
WIRE SAFE-T 1.5MM-J .035X260CM (WIRE) ×3 IMPLANT

## 2015-09-16 NOTE — Interval H&P Note (Signed)
History and Physical Interval Note:  09/16/2015 8:16 AM  Joseph Fitzpatrick  has presented today for surgery, with the diagnosis of cp  The various methods of treatment have been discussed with the patient and family. After consideration of risks, benefits and other options for treatment, the patient has consented to  Procedure(s): Left Heart Cath and Coronary Angiography (N/A) as a surgical intervention .  The patient's history has been reviewed, patient examined, no change in status, stable for surgery.  I have reviewed the patient's chart and labs.  Questions were answered to the patient's satisfaction.    Cath Lab Visit (complete for each Cath Lab visit)  Clinical Evaluation Leading to the Procedure:   ACS: Yes.    Non-ACS:    Anginal Classification: CCS IV  Anti-ischemic medical therapy: Minimal Therapy (1 class of medications)  Non-Invasive Test Results: No non-invasive testing performed  Prior CABG: No previous CABG       Joseph Fitzpatrick [facility] 09/16/2015 8:16 AM

## 2015-09-16 NOTE — H&P (View-Only) (Signed)
The patient had further troponin elevation. The plan is to proceed with cardiac catheterization this morning.  Daryel November, MD

## 2015-09-16 NOTE — Progress Notes (Signed)
The patient had further troponin elevation. The plan is to proceed with cardiac catheterization this morning.  Daryel November, MD

## 2015-09-16 NOTE — Progress Notes (Signed)
TR BAND REMOVAL  LOCATION:  right radial  DEFLATED PER PROTOCOL:  Yes.    TIME BAND OFF / DRESSING APPLIED:   1400   SITE UPON ARRIVAL:   Level 0  SITE AFTER BAND REMOVAL:  Level 0  CIRCULATION SENSATION AND MOVEMENT:  Within Normal Limits  Yes.    COMMENTS:    

## 2015-09-16 NOTE — Progress Notes (Signed)
ANTICOAGULATION CONSULT NOTE - Follow Up Consult  Pharmacy Consult:  Heparin Indication: chest pain/ACS  No Known Allergies  Patient Measurements: Height: 5\' 10"  (177.8 cm) Weight: 201 lb 9.6 oz (91.445 kg) IBW/kg (Calculated) : 73 Heparin Dosing Weight: 91 kg  Vital Signs: Temp: 99 F (37.2 C) (10/21 0549) Temp Source: Oral (10/21 0549) BP: 143/68 mmHg (10/21 0549) Pulse Rate: 59 (10/21 0549)  Labs:  Recent Labs  09/15/15 1113 09/15/15 1800 09/15/15 2055 09/15/15 2337 09/16/15 0407  HGB 13.3  --   --   --  13.9  HCT 39.6  --   --   --  41.2  PLT 185  --   --   --  190  LABPROT 14.1  --   --   --   --   INR 1.07  --   --   --   --   HEPARINUNFRC  --   --  0.39  --  0.53  CREATININE 1.23  --   --   --  1.08  TROPONINI  --  3.62*  --  4.26* 3.46*    Estimated Creatinine Clearance: 79.6 mL/min (by C-G formula based on Cr of 1.08).     Assessment: 12 YOM presented with chest pain and to continue on IV heparin for ACS.  Heparin level is therapeutic; no bleeding reported.  Cath this AM.   Goal of Therapy:  Heparin level 0.3-0.7 units/ml Monitor platelets by anticoagulation protocol: Yes    Plan:  - Continue heparin gtt at 1300 units/hr - Daily HL / CBC - F/U post cath    Comfort Iversen D. Mina Marble, PharmD, BCPS Pager:  [phone] 09/16/2015, 7:25 AM

## 2015-09-17 ENCOUNTER — Encounter (HOSPITAL_COMMUNITY): Payer: Self-pay | Admitting: Nurse Practitioner

## 2015-09-17 DIAGNOSIS — I10 Essential (primary) hypertension: Secondary | ICD-10-CM | POA: Diagnosis present

## 2015-09-17 DIAGNOSIS — I2510 Atherosclerotic heart disease of native coronary artery without angina pectoris: Secondary | ICD-10-CM | POA: Diagnosis present

## 2015-09-17 DIAGNOSIS — E7800 Pure hypercholesterolemia, unspecified: Secondary | ICD-10-CM | POA: Diagnosis present

## 2015-09-17 DIAGNOSIS — Z720 Tobacco use: Secondary | ICD-10-CM | POA: Diagnosis present

## 2015-09-17 DIAGNOSIS — N182 Chronic kidney disease, stage 2 (mild): Secondary | ICD-10-CM | POA: Diagnosis present

## 2015-09-17 DIAGNOSIS — R7303 Prediabetes: Secondary | ICD-10-CM | POA: Diagnosis present

## 2015-09-17 LAB — CBC
HCT: 42.8 % (ref 39.0–52.0)
Hemoglobin: 14.6 g/dL (ref 13.0–17.0)
MCH: 30.9 pg (ref 26.0–34.0)
MCHC: 34.1 g/dL (ref 30.0–36.0)
MCV: 90.5 fL (ref 78.0–100.0)
PLATELETS: 194 10*3/uL (ref 150–400)
RBC: 4.73 MIL/uL (ref 4.22–5.81)
RDW: 13.3 % (ref 11.5–15.5)
WBC: 8.4 10*3/uL (ref 4.0–10.5)

## 2015-09-17 LAB — BASIC METABOLIC PANEL
Anion gap: 10 (ref 5–15)
BUN: 14 mg/dL (ref 6–20)
CHLORIDE: 99 mmol/L — AB (ref 101–111)
CO2: 29 mmol/L (ref 22–32)
CREATININE: 1.28 mg/dL — AB (ref 0.61–1.24)
Calcium: 9.7 mg/dL (ref 8.9–10.3)
GFR calc non Af Amer: 58 mL/min — ABNORMAL LOW (ref >60)
GLUCOSE: 113 mg/dL — AB (ref 65–99)
Potassium: 4.1 mmol/L (ref 3.5–5.1)
SODIUM: 138 mmol/L (ref 135–145)

## 2015-09-17 LAB — HEMOGLOBIN A1C
Hgb A1c MFr Bld: 5.9 % — ABNORMAL HIGH (ref 4.8–5.6)
Mean Plasma Glucose: 123 mg/dL

## 2015-09-17 MED ORDER — AMLODIPINE BESYLATE 5 MG PO TABS: 5.0000 mg | ORAL_TABLET | Freq: Every day | ORAL | Status: DC

## 2015-09-17 MED ORDER — ATORVASTATIN CALCIUM 80 MG PO TABS: 80.0000 mg | ORAL_TABLET | Freq: Every day | ORAL | Status: DC

## 2015-09-17 MED ORDER — TICAGRELOR 90 MG PO TABS: 90.0000 mg | ORAL_TABLET | Freq: Two times a day (BID) | ORAL | Status: DC

## 2015-09-17 MED ORDER — METOPROLOL TARTRATE 25 MG PO TABS: 25.0000 mg | ORAL_TABLET | Freq: Two times a day (BID) | ORAL | Status: DC

## 2015-09-17 MED ORDER — NITROGLYCERIN 0.4 MG SL SUBL: 0.4000 mg | SUBLINGUAL_TABLET | SUBLINGUAL | Status: DC | PRN

## 2015-09-17 MED ORDER — ASPIRIN 81 MG PO TBEC: 81.0000 mg | DELAYED_RELEASE_TABLET | Freq: Every day | ORAL | Status: AC

## 2015-09-17 MED ORDER — METOPROLOL TARTRATE 25 MG PO TABS: 12.5000 mg | ORAL_TABLET | Freq: Two times a day (BID) | ORAL | Status: DC

## 2015-09-17 NOTE — Discharge Summary (Signed)
Discharge Summary   Patient ID: Joseph Fitzpatrick,  MRN: [id number], DOB/AGE: 02-10-1951 64 y.o.  Admit date: 09/15/2015 Discharge date: 09/17/2015  Primary Care Provider: Gerrit Heck Primary Cardiologist: P. Martinique, MD   Discharge Diagnoses    Principal Problem:   NSTEMI (non-ST elevated myocardial infarction) ([place])  **Status-post PCI/DES to the OM1 this admission.  Active Problems:   CAD (coronary artery disease)   Tobacco abuse   CKD (chronic kidney disease) stage 2, GFR 60-89 ml/min   Essential hypertension   Hypercholesterolemia   Prediabetes  **HbA1c = 5.9 this admission.   Allergies No Known Allergies  Diagnostic Studies/Procedures    Cardiac Catheterization and Percutaneous Coronary Intervention 10.21.2016  Coronary Findings     Dominance: Right    Left Anterior Descending Normal _____________  Ramus Intermedius  The vessel is large, normal.      Left Circumflex   . Prox Cx lesion, 20% stenosed.   . First Obtuse Marginal Branch   . 1st Mrg lesion, 95% stenosed. diffuse.   Marland Kitchen PCI: The OM1 was successfully stented using a 2.25 x 32 mm Promus Premier DES.  Marland Kitchen There is no residual stenosis post intervention.     . Lateral First Obtuse Marginal Branch   The vessel is small in size.      Right Coronary Artery   . Mid RCA lesion, 60% stenosed. eccentric. Pressure wire/FFR was performed on the lesion. FFR: 0.93 Medical Therapy.     Left Ventricle The left ventricular size is normal. The left ventricular systolic function is normal. The left ventricular ejection fraction is 55-65% by visual estimate. There are no wall motion abnormalities in the left ventricle.   Mitral Valve There is no mitral valve regurgitation. _____________   History of Present Illness  64 y/o male with a h/o HTN, HL, borderline DM, tobacco abuse, and remote substance abuse.  He was in his USOH until the morning of 09/15/2015, when he developed substernal chest pressure  radiating to his jaw bilaterally.  He checked his BP and found it to be elevated at 180/100 and decided to seek medical attention.  He presented to the [facility] ED where ECG showed poor R wave progression and inferior T wave inversion.  Troponin was mildly elevated @ 0.14.  He was seen by cardiology and admitted for further evaluation of acute coronary syndrome.  Hospital Course   Consultants: None   Patient ruled in for NSTEMI, eventually peaking his troponin at 4.26.  He was placed on asa, heparin, bb, and high potency statin and underwent diagnostic catheterization on 10/21 revealing severe disease within the first obtuse marginal and moderate RCA disease.  The OM1 was felt to be the culprit vessel and this was successfully treated with a 2.25 x 32 mm Promus Premier drug-eluting stent.  Fractional flow reserve was measured across the RCA lesion and was normal at 0.93.  Thus medical therapy was recommended.  Post-procedure, Joseph Fitzpatrick has been ambulating without recurrent chest pain.  He has described mild dyspnea, which may be related to brilinta therapy.    Joseph Fitzpatrick will be discharged home today in good condition.  We will arrange for a follow-up appointment within the next week at which time we will reassess his dyspnea and also a BMET, given mild creatinine elevation post-PCI (1.28).  We have switched him from moderate dose simvastatin to high potency lipitor in the setting of ACS and a LDL of 141.  He will require f/u lipids and lft's in  8 wks.  _____________  Discharge Vitals Blood pressure 143/71, pulse 68, temperature 97.6 F (36.4 C), temperature source Oral, resp. rate 18, height 5\' 10"  (1.778 m), weight 204 lb 9.4 oz (92.8 kg), SpO2 100 %.  Filed Weights   09/15/15 1013 09/16/15 0549 09/17/15 0416  Weight: 205 lb (92.987 kg) 201 lb 9.6 oz (91.445 kg) 204 lb 9.4 oz (92.8 kg)   _____________  Labs     CBC  Recent Labs  09/16/15 0407 09/17/15 0431  WBC 7.9 8.4  HGB 13.9  14.6  HCT 41.2 42.8  MCV 90.5 90.5  PLT 190 160   Basic Metabolic Panel  Recent Labs  09/16/15 0407 09/17/15 0431  NA 135 138  K 3.6 4.1  CL 98* 99*  CO2 27 29  GLUCOSE 104* 113*  BUN 11 14  CREATININE 1.08 1.28*  CALCIUM 8.8* 9.7    Recent Labs  09/15/15 1113  LIPASE 29   Cardiac Enzymes  Recent Labs  09/15/15 1800 09/15/15 2337 09/16/15 0407  TROPONINI 3.62* 4.26* 3.46*   Hemoglobin A1C  Recent Labs  09/16/15 0407  HGBA1C 5.9*   Fasting Lipid Panel  Recent Labs  09/16/15 0407  CHOL 195  HDL 39*  LDLCALC 141*  TRIG 74  CHOLHDL 5.0   _____________  Disposition   Pt is being discharged home today in good condition. _____________  Follow-up Plans & Appointments    Follow-up Information    Follow up with Peter Martinique, MD In 1 week.   Specialty:  Cardiology   Why:  We will arrange for follow-up with Dr. Doug Sou Nurse Practitioner and contact you.   Contact information:   [place]  [address] [phone]       Follow up with Gerrit Heck, MD.   Specialty:  Family Medicine   Why:  as scheduled.   Contact information:   [place] [address] [phone]        Discharge Medications   Current Discharge Medication List    START taking these medications   Details  amLODipine (NORVASC) 5 MG tablet Take 1 tablet (5 mg total) by mouth daily. Qty: 30 tablet, Refills: 6    aspirin EC 81 MG EC tablet Take 1 tablet (81 mg total) by mouth daily.    atorvastatin (LIPITOR) 80 MG tablet Take 1 tablet (80 mg total) by mouth daily at 6 PM. Qty: 30 tablet, Refills: 6    nitroGLYCERIN (NITROSTAT) 0.4 MG SL tablet Place 1 tablet (0.4 mg total) under the tongue every 5 (five) minutes x 3 doses as needed for chest pain. Qty: 25 tablet, Refills: 3    ticagrelor (BRILINTA) 90 MG TABS tablet Take 1 tablet (90 mg total) by mouth 2 (two) times daily. Qty: 60 tablet, Refills: 6        CONTINUE these medications which have CHANGED   Details  metoprolol tartrate (LOPRESSOR) 25 MG tablet Take 0.5 tablets (12.5 mg total) by mouth 2 (two) times daily. Qty: 30 tablet, Refills: 6      CONTINUE these medications which have NOT CHANGED   Details  clonazePAM (KLONOPIN) 1 MG tablet Take 1-2 mg by mouth 2 (two) times daily. Pt takes 1mg  in am and 2mg  in pm    hydrOXYzine (VISTARIL) 25 MG capsule Take 25-75 mg by mouth 3 (three) times daily. Pt takes 25mg  in am, 50mg  in afternoon and 75mg  at bedtime    KLOR-CON M10 10 MEQ tablet Take  10 mEq by mouth daily. Refills: 1    Loratadine (CLARITIN) 10 MG CAPS Take 10 mg by mouth daily.    losartan-hydrochlorothiazide (HYZAAR) 50-12.5 MG per tablet Take 1 tablet by mouth daily.     Omega-3 Fatty Acids (FISH OIL) 1000 MG CAPS Take 2 capsules by mouth daily.    PARoxetine (PAXIL) 40 MG tablet Take 40 mg by mouth every evening. Refills: 11    vitamin B-12 (CYANOCOBALAMIN) 100 MCG tablet Take 100 mcg by mouth daily.      STOP taking these medications     simvastatin (ZOCOR) 40 MG tablet          Aspirin prescribed at discharge:  Yes High Intensity Statin Prescribed? (Lipitor 40-80mg  or Crestor 20-40mg ): Yes Beta Blocker Prescribed: Yes ADP Receptor Inhibitor Prescribed? (i.e. Plavix etc.-Includes Medically Managed Patients): Yes For EF <40%, Aldosterone Inhibitor Prescribed? No: N/A Was EF assessed during THIS hospitalization? Yes Was Cardiac Rehab II ordered? (Included Medically managed Patients): Yes _____________   Outstanding Labs/Studies   F/U BMET in 1 wk @ f/u appt. F/U lipids/LFT's in 8 wks.  Duration of Discharge Encounter   Greater than 30 minutes including physician time.  Signed, Murray Hodgkins NP 09/17/2015, 9:05 AM

## 2015-09-17 NOTE — Progress Notes (Signed)
TELEMETRY: Reviewed telemetry pt in NSR with sinus bradycardia: Filed Vitals:   09/16/15 1830 09/16/15 1900 09/16/15 1944 09/17/15 0416  BP: 169/91 153/82 117/68 136/61  Pulse: 53 55 57 56  Temp:   97.4 F (36.3 C) 97.3 F (36.3 C)  TempSrc:   Oral Axillary  Resp: 11 13 12 20   Height:      Weight:    92.8 kg (204 lb 9.4 oz)  SpO2: 97% 96% 99% 100%    Intake/Output Summary (Last 24 hours) at 09/17/15 0735 Last data filed at 09/17/15 0416  Gross per 24 hour  Intake 2056.8 ml  Output   3825 ml  Net -1768.2 ml   Filed Weights   09/15/15 1013 09/16/15 0549 09/17/15 0416  Weight: 92.987 kg (205 lb) 91.445 kg (201 lb 9.6 oz) 92.8 kg (204 lb 9.4 oz)    Subjective Feels fine. No chest or jaw pain. Had some SOB yesterday ?related to Brilinta. Resolved today.  Marland Kitchen amLODipine  5 mg Oral Daily  . aspirin EC  81 mg Oral Daily  . atorvastatin  80 mg Oral q1800  . clonazePAM  1 mg Oral Daily   And  . clonazePAM  2 mg Oral QHS  . losartan  50 mg Oral Daily   And  . hydrochlorothiazide  12.5 mg Oral Daily  . hydrOXYzine  25 mg Oral Q0600   And  . hydrOXYzine  50 mg Oral Q1400   And  . hydrOXYzine  75 mg Oral QHS  . loratadine  10 mg Oral Daily  . metoprolol tartrate  12.5 mg Oral BID  . omega-3 acid ethyl esters  2 g Oral Daily  . PARoxetine  40 mg Oral QPM  . potassium chloride  10 mEq Oral Daily  . ticagrelor  90 mg Oral BID  . vitamin B-12  100 mcg Oral Daily      LABS: Basic Metabolic Panel:  Recent Labs  09/16/15 0407 09/17/15 0431  NA 135 138  K 3.6 4.1  CL 98* 99*  CO2 27 29  GLUCOSE 104* 113*  BUN 11 14  CREATININE 1.08 1.28*  CALCIUM 8.8* 9.7   Liver Function Tests: No results for input(s): AST, ALT, ALKPHOS, BILITOT, PROT, ALBUMIN in the last 72 hours.  Recent Labs  09/15/15 1113  LIPASE 29   CBC:  Recent Labs  09/16/15 0407 09/17/15 0431  WBC 7.9 8.4  HGB 13.9 14.6  HCT 41.2 42.8  MCV 90.5 90.5  PLT 190 194   Cardiac  Enzymes:  Recent Labs  09/15/15 1800 09/15/15 2337 09/16/15 0407  TROPONINI 3.62* 4.26* 3.46*   BNP: No results for input(s): PROBNP in the last 72 hours. D-Dimer: No results for input(s): DDIMER in the last 72 hours. Hemoglobin A1C:  Recent Labs  09/16/15 0407  HGBA1C 5.9*   Fasting Lipid Panel:  Recent Labs  09/16/15 0407  CHOL 195  HDL 39*  LDLCALC 141*  TRIG 74  CHOLHDL 5.0   Thyroid Function Tests: No results for input(s): TSH, T4TOTAL, T3FREE, THYROIDAB in the last 72 hours.  Invalid input(s): FREET3   Radiology/Studies:  Dg Chest 2 View  09/15/2015  CLINICAL DATA:  Chest pain with nausea EXAM: CHEST  2 VIEW COMPARISON:  02/19/2015 FINDINGS: Normal heart size and mediastinal contours. No acute infiltrate or edema. Calcified granuloma in the right upper lobe. No effusion or pneumothorax. No acute osseous findings. IMPRESSION: No active cardiopulmonary disease. Electronically Signed   By: Monte Fantasia  M.D.   On: 09/15/2015 11:29    PHYSICAL EXAM General: Well developed, well nourished, in no acute distress. Head: Normocephalic, atraumatic, sclera non-icteric, oropharynx is clear Neck: Negative for carotid bruits. JVD not elevated. No adenopathy Lungs: Clear bilaterally to auscultation without wheezes, rales, or rhonchi. Breathing is unlabored. Heart: RRR S1 S2 without murmurs, rubs, or gallops.  Abdomen: Soft, non-tender, non-distended with normoactive bowel sounds. No hepatomegaly. No rebound/guarding. No obvious abdominal masses. Msk:  Strength and tone appears normal for age. Extremities: No clubbing, cyanosis or edema.  Distal pedal pulses are 2+ and equal bilaterally. No radial site hematoma. Neuro: Alert and oriented X 3. Moves all extremities spontaneously. Psych:  Responds to questions appropriately with a normal affect.  ASSESSMENT AND PLAN: 1. NSTEMI. Single vessel obstructive CAD involving OM1. S/p DES. Borderline lesion in mid RCA with normal  FFR. Normal LV function. Continue DAPT for one year. Continue Brilinta and monitor for symptoms of dyspnea.  2. Tobacco abuse. Recommend complete smoking cessation 3. HTN. BP significantly elevated yesterday. Improved today. On Hyzaar. Metoprolol dose reduced due to bradycardia. Amlodipine added.  4. Hypercholesterolemia. Was on Zocor 40 mg daily with LDL 141. Will switch to lipitor 80 mg daily. 5. Pre-diabetes. A1c 5.9%.  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 Will need repeat BMET as outpatient with contrast load.   Plan: DC home today. Recommend phase 2 cardiac Rehab.   Present on Admission:  . Chest pain . NSTEMI (non-ST elevated myocardial infarction) ([place])  Signed, Peter Martinique, [place] 09/17/2015 7:35 AM

## 2015-09-17 NOTE — Progress Notes (Signed)
CARDIAC REHAB PHASE I   PRE:  Rate/Rhythm: 19 SBrady  BP:  Supine:   Sitting: 143/71  Standing:    SaO2: 96 RA  MODE:  Ambulation: 700 ft   POST:  Rate/Rhythm: 68 SR  BP:  Supine:   Sitting: 149/75  Standing:    SaO2: 94 RA Patient tolerated ambulation independently with angina or difficulty.  Education completed LI:DCVUDT symptoms, NTG usage, when to call the doctor and when to call 911.  Given handout on diabetic nutrition tips and discussed heart healthy meals.  Completed cath site care and restrictions, smoking cessation, and exercise progression.  Referred to phase II cardiac rehab in [place].   [phone]  Liliane Channel RN, BSN 09/17/2015 8:54 AM

## 2015-09-17 NOTE — Discharge Instructions (Signed)

## 2015-09-19 ENCOUNTER — Telehealth: Payer: Self-pay | Admitting: Cardiology

## 2015-09-19 NOTE — Telephone Encounter (Signed)
Patient contacted regarding discharge from [facility] on 09/17/15.  Patient understands to follow up with provider Truitt Merle on 09/23/15 at 2pm at [facility]. Patient understands discharge instructions? YES Patient understands medications and regiment? YES Patient understands to bring all medications to this visit? YES  Has been walking - 5 minutes yesterday, 6 minutes today. No med reactions.  Concerned about NSAIDS & aspirin - informed him it is OK to take aspirin.

## 2015-09-19 NOTE — Telephone Encounter (Signed)
7 day TCM per C Berge  10/28 @ 2pm w/ Cecille Rubin

## 2015-09-23 ENCOUNTER — Encounter: Payer: Self-pay | Admitting: Nurse Practitioner

## 2015-09-23 ENCOUNTER — Ambulatory Visit (INDEPENDENT_AMBULATORY_CARE_PROVIDER_SITE_OTHER): Payer: 59 | Admitting: Nurse Practitioner

## 2015-09-23 VITALS — BP 138/98 | HR 67 | Ht 70.75 in | Wt 206.2 lb

## 2015-09-23 DIAGNOSIS — I10 Essential (primary) hypertension: Secondary | ICD-10-CM

## 2015-09-23 DIAGNOSIS — I214 Non-ST elevation (NSTEMI) myocardial infarction: Secondary | ICD-10-CM | POA: Diagnosis not present

## 2015-09-23 DIAGNOSIS — E785 Hyperlipidemia, unspecified: Secondary | ICD-10-CM

## 2015-09-23 DIAGNOSIS — Z955 Presence of coronary angioplasty implant and graft: Secondary | ICD-10-CM

## 2015-09-23 LAB — BASIC METABOLIC PANEL
BUN: 14 mg/dL (ref 7–25)
CO2: 26 mmol/L (ref 20–31)
Calcium: 9.3 mg/dL (ref 8.6–10.3)
Chloride: 102 mmol/L (ref 98–110)
Creat: 1.18 mg/dL (ref 0.70–1.25)
Glucose, Bld: 109 mg/dL — ABNORMAL HIGH (ref 65–99)
Potassium: 4 mmol/L (ref 3.5–5.3)
Sodium: 137 mmol/L (ref 135–146)

## 2015-09-23 LAB — CBC
HCT: 41.8 % (ref 39.0–52.0)
Hemoglobin: 14.1 g/dL (ref 13.0–17.0)
MCH: 30.3 pg (ref 26.0–34.0)
MCHC: 33.7 g/dL (ref 30.0–36.0)
MCV: 89.7 fL (ref 78.0–100.0)
MPV: 10.6 fL (ref 8.6–12.4)
Platelets: 241 10*3/uL (ref 150–400)
RBC: 4.66 MIL/uL (ref 4.22–5.81)
RDW: 13.5 % (ref 11.5–15.5)
WBC: 9.5 10*3/uL (ref 4.0–10.5)

## 2015-09-23 NOTE — Patient Instructions (Signed)
We will be checking the following labs today - BMET and CBC   Medication Instructions:    Continue with your current medicines.     Testing/Procedures To Be Arranged:  N/A  Follow-Up:   See Dr. Tamala Julian in 6 weeks - or me on a day that Dr. Tamala Julian is here with fasting labs    Other Special Instructions:   Keep working on your smoking  Increase your walking as advised - goal is 45 minutes every day    If you need a refill on your cardiac medications before your next appointment, please call your pharmacy.   Call the Center office at [phone] if you have any questions, problems or concerns.

## 2015-09-23 NOTE — Progress Notes (Signed)
CARDIOLOGY OFFICE NOTE  Date:  09/23/2015    Joseph Fitzpatrick Date of Birth: 1951-09-13 Medical Record #[id number]  PCP:  Gerrit Heck, MD  Cardiologist:  Martinique    Chief Complaint  Patient presents with  . Coronary Artery Disease    TOC visit - seen for Dr. Martinique.     History of Present Illness: Joseph Fitzpatrick is a 64 y.o. male who presents today for a TOC visit/post hospital visit. Seen for Dr. Martinique.   He has a h/o HTN, HL, borderline DM, tobacco abuse, and remote substance abuse.   He was in his USOH until the morning of 09/15/2015, when he developed substernal chest pressure radiating to his jaw bilaterally. He checked his BP and found it to be elevated at 180/100 and decided to seek medical attention. He presented to the [facility] ED where ECG showed poor R wave progression and inferior T wave inversion. Troponin was mildly elevated @ 0.14. He was seen by cardiology and admitted for further evaluation of acute coronary syndrome.  Patient ruled in for NSTEMI, eventually peaking his troponin at 4.26. He was placed on asa, heparin, bb, and high potency statin and underwent diagnostic catheterization on 10/21 revealing severe disease within the first obtuse marginal and moderate RCA disease. The OM1 was felt to be the culprit vessel and this was successfully treated with a 2.25 x 32 mm Promus Premier drug-eluting stent. Fractional flow reserve was measured across the RCA lesion and was normal at 0.93. Thus medical therapy was recommended. Post-procedure, [name] has been ambulating without recurrent chest pain. He has described mild dyspnea, which may be related to brilinta therapy.   Comes in today. Here alone. Went to the wrong office. Doing well. No more chest pain. His breathing is fine and he denies shortness of breath. Not dizzy or lightheaded. No problems with his wrist. He is smoking less - does not quantify. He has stopped drinking Pepsi - now  using diet soda. He has no real concerns and feels like he is doing well. He would like to follow with Dr. Tamala Julian going forward since Dr. Tamala Julian took care of his father.   Past Medical History  Diagnosis Date  . Essential hypertension   . Tobacco abuse   . PVCs (premature ventricular contractions)   . Anxiety   . Depression   . BPH (benign prostatic hypertrophy)   . Hypokalemia   . Hematuria   . History of substance abuse   . IBS (irritable bowel syndrome)   . History of nephrolithiasis   . Hypercholesterolemia   . History of pneumonia 1993 X 1  . Borderline diabetes     a. 08/2015 HbA1c = 5.9.  . History of hiatal hernia     "suspected; never confirmed"  . GERD (gastroesophageal reflux disease)   . History of duodenal ulcer   . CAD (coronary artery disease)     a. 08/2015 NSTEMI/PCI: LM nl, LAD nl, RI nl, LCX 20p, OM1 95 (2.25x32 Pomus Premier DES), RCA 71m (FFR 0.93)->Med Rx, EF 55-65%.    Past Surgical History  Procedure Laterality Date  . Tonsillectomy    . Cholecystectomy open    . Cardiac catheterization N/A 09/16/2015    Procedure: Left Heart Cath and Coronary Angiography;  Surgeon: Peter M Martinique, MD;  Location: [place] [facility];  Service: Cardiovascular;  Laterality: N/A;  . Cardiac catheterization  09/16/2015    Procedure: Intravascular Pressure Wire/FFR Study;  Surgeon: Ander Slade  Martinique, MD;  Location: [place] [facility];  Service: Cardiovascular;;  . Cardiac catheterization  09/16/2015    Procedure: Coronary Stent Intervention;  Surgeon: Peter M Martinique, MD;  Location: [place] [facility];  Service: Cardiovascular;;     Medications: Current Outpatient Prescriptions  Medication Sig Dispense Refill  . amLODipine (NORVASC) 5 MG tablet Take 1 tablet (5 mg total) by mouth daily. 30 tablet 6  . aspirin EC 81 MG EC tablet Take 1 tablet (81 mg total) by mouth daily.    Marland Kitchen atorvastatin (LIPITOR) 80 MG tablet Take 1 tablet (80 mg total) by mouth daily at 6 PM. 30 tablet 6    . clonazePAM (KLONOPIN) 1 MG tablet Take 1-2 mg by mouth 2 (two) times daily. Pt takes 1mg  in am and 2mg  in pm    . hydrOXYzine (VISTARIL) 25 MG capsule Take 25-75 mg by mouth 3 (three) times daily. Pt takes 25mg  in am, 50mg  in afternoon and 75mg  at bedtime    . KLOR-CON M10 10 MEQ tablet Take 10 mEq by mouth daily.  1  . Loratadine (CLARITIN) 10 MG CAPS Take 10 mg by mouth daily.    Marland Kitchen losartan-hydrochlorothiazide (HYZAAR) 50-12.5 MG per tablet Take 1 tablet by mouth daily.     . metoprolol tartrate (LOPRESSOR) 25 MG tablet Take 0.5 tablets (12.5 mg total) by mouth 2 (two) times daily. 30 tablet 6  . nitroGLYCERIN (NITROSTAT) 0.4 MG SL tablet Place 1 tablet (0.4 mg total) under the tongue every 5 (five) minutes x 3 doses as needed for chest pain. 25 tablet 3  . Omega-3 Fatty Acids (FISH OIL) 1000 MG CAPS Take 2 capsules by mouth daily.    Marland Kitchen PARoxetine (PAXIL) 40 MG tablet Take 40 mg by mouth every evening.  11  . ticagrelor (BRILINTA) 90 MG TABS tablet Take 1 tablet (90 mg total) by mouth 2 (two) times daily. 60 tablet 6  . vitamin B-12 (CYANOCOBALAMIN) 100 MCG tablet Take 100 mcg by mouth daily.     No current facility-administered medications for this visit.    Allergies: No Known Allergies  Social History: The patient  reports that he has been smoking Cigarettes.  He started smoking about 43 years ago. He has a 11.75 pack-year smoking history. He has never used smokeless tobacco. He reports that he drinks alcohol. He reports that he uses illicit drugs (Marijuana).   Family History: The patient's family history includes Alzheimer's disease in his mother; Anxiety disorder in his mother; CAD in his father; Depression in his father; High blood pressure in his mother.   Review of Systems: Please see the history of present illness.   Otherwise, the review of systems is positive for none.   All other systems are reviewed and negative.   Physical Exam: VS:  BP 138/98 mmHg  Pulse 67  Ht 5'  10.75" (1.797 m)  Wt 206 lb 3.2 oz (93.532 kg)  BMI 28.96 kg/m2  SpO2 100% .  BMI Body mass index is 28.96 kg/(m^2).  Wt Readings from Last 3 Encounters:  09/23/15 206 lb 3.2 oz (93.532 kg)  09/17/15 204 lb 9.4 oz (92.8 kg)  01/05/14 228 lb (103.42 kg)   BP by me is 130/80.   General: Pleasant. Well developed, well nourished and in no acute distress.  HEENT: Normal. Neck: Supple, no JVD, carotid bruits, or masses noted.  Cardiac: Regular rate and rhythm. No murmurs, rubs, or gallops. No edema.  Respiratory:  Lungs are clear to auscultation  bilaterally with normal work of breathing.  GI: Soft and nontender.  MS: No deformity or atrophy. Gait and ROM intact. Skin: Warm and dry. Color is normal.  Neuro:  Strength and sensation are intact and no gross focal deficits noted.  Psych: Alert, appropriate and with normal affect. Right wrist is fine.    LABORATORY DATA:  EKG:  EKG is not ordered today.   Lab Results  Component Value Date   WBC 8.4 09/17/2015   HGB 14.6 09/17/2015   HCT 42.8 09/17/2015   PLT 194 09/17/2015   GLUCOSE 113* 09/17/2015   CHOL 195 09/16/2015   TRIG 74 09/16/2015   HDL 39* 09/16/2015   LDLCALC 141* 09/16/2015   ALT 18 02/20/2013   AST 16 02/20/2013   NA 138 09/17/2015   K 4.1 09/17/2015   CL 99* 09/17/2015   CREATININE 1.28* 09/17/2015   BUN 14 09/17/2015   CO2 29 09/17/2015   TSH 1.899 02/19/2013   INR 1.07 09/15/2015   HGBA1C 5.9* 09/16/2015    BNP (last 3 results) No results for input(s): BNP in the last 8760 hours.  ProBNP (last 3 results) No results for input(s): PROBNP in the last 8760 hours.   Other Studies Reviewed Today:  Procedures    Coronary Stent Intervention   Intravascular Pressure Wire/FFR Study   Left Heart Cath and Coronary Angiography    Conclusion     Prox Cx lesion, 20% stenosed.  The left ventricular systolic function is normal.  Mid RCA lesion, 60% stenosed.  1st Mrg lesion, 95% stenosed. Post  intervention, there is a 0% residual stenosis.  1. Single vessel obstructive CAD involving the first OM 2. Borderline eccentric stenosis in the mid RCA with normal FFR of 0.93. 3. Normal LV function 4. Successful stenting of the first OM with a DES.  Plan: DAPT for one year. Aggressive risk factor modification. Anticipate DC in am.     Assessment/Plan: 1. NSTEMI - with PCI to 1st OM and residual disease in the RCA with normal FFR - he is on DAPT without issue - he is doing well clinically. Recheck baseline labs today. See back in 6 weeks with fasting labs. May consider cardiac rehab.   2. HLD - now on high dose statin - recheck lab on return.   3. Smoking - does not quantify how much but says he has cut down.   4. HTN - recheck BP by me is fine - no change with his current regimen.   5. Borderline DM - has stopped his regular soda.   Current medicines are reviewed with the patient today.  The patient does not have concerns regarding medicines other than what has been noted above.  The following changes have been made:  See above.  Labs/ tests ordered today include:    Orders Placed This Encounter  Procedures  . Basic metabolic panel  . CBC     Disposition:   He would like to see Dr. Tamala Julian going forward - I will see him back in 6 weeks with fasting labs.   Patient is agreeable to this plan and will call if any problems develop in the interim.   Signed: Burtis Junes, RN, ANP-C 09/23/2015 3:24 PM  [facility] [address] [place], [place]  [address] Phone: [phone] Fax: [phone]

## 2015-10-03 ENCOUNTER — Telehealth: Payer: Self-pay | Admitting: *Deleted

## 2015-10-03 NOTE — Telephone Encounter (Signed)
Pt is aware Dr. Tamala Julian will be his cardiologist and I made pt a three month f/u with Dr. Tamala Julian per Tera Helper.

## 2015-10-03 NOTE — Telephone Encounter (Signed)
-----   Message from Burtis Junes, NP sent at 10/03/2015 11:18 AM EST ----- Will you call and let Joseph Fitzpatrick know that Dr. Tamala Julian will be glad to follow.   Cecille Rubin ----- Message -----    From: Belva Crome, MD    Sent: 10/02/2015  11:36 AM      To: Burtis Junes, NP  Yes. Be glad to see him. ----- Message -----    From: Burtis Junes, NP    Sent: 09/23/2015   3:41 PM      To: Belva Crome, MD  Dr. Tamala Julian -  [name] is asking to follow with you since you cared for his father. Is this ok with you? Cecille Rubin

## 2015-10-03 NOTE — Progress Notes (Signed)
This will be OK with me.

## 2015-10-06 ENCOUNTER — Telehealth (HOSPITAL_COMMUNITY): Payer: Self-pay

## 2015-10-06 NOTE — Telephone Encounter (Signed)
Pt declined CRPII at this time. Gave contact information in case pt changed mind.

## 2015-11-15 ENCOUNTER — Encounter: Payer: Self-pay | Admitting: Nurse Practitioner

## 2015-11-15 ENCOUNTER — Ambulatory Visit (INDEPENDENT_AMBULATORY_CARE_PROVIDER_SITE_OTHER): Payer: 59 | Admitting: Nurse Practitioner

## 2015-11-15 VITALS — BP 118/74 | HR 62 | Ht 70.0 in | Wt 204.8 lb

## 2015-11-15 DIAGNOSIS — E785 Hyperlipidemia, unspecified: Secondary | ICD-10-CM | POA: Diagnosis not present

## 2015-11-15 DIAGNOSIS — Z955 Presence of coronary angioplasty implant and graft: Secondary | ICD-10-CM

## 2015-11-15 DIAGNOSIS — I214 Non-ST elevation (NSTEMI) myocardial infarction: Secondary | ICD-10-CM

## 2015-11-15 DIAGNOSIS — I10 Essential (primary) hypertension: Secondary | ICD-10-CM

## 2015-11-15 LAB — LIPID PANEL
Cholesterol: 88 mg/dL — ABNORMAL LOW (ref 125–200)
HDL: 41 mg/dL (ref >=40)
LDL Cholesterol: 36 mg/dL (ref <130)
Total CHOL/HDL Ratio: 2.1 Ratio (ref <=5.0)
Triglycerides: 54 mg/dL (ref <150)
VLDL: 11 mg/dL (ref <30)

## 2015-11-15 LAB — HEPATIC FUNCTION PANEL
ALT: 17 U/L (ref 9–46)
AST: 17 U/L (ref 10–35)
Albumin: 4.4 g/dL (ref 3.6–5.1)
Alkaline Phosphatase: 58 U/L (ref 40–115)
Bilirubin, Direct: 0.2 mg/dL (ref <=0.2)
Indirect Bilirubin: 0.2 mg/dL (ref 0.2–1.2)
Total Bilirubin: 0.4 mg/dL (ref 0.2–1.2)
Total Protein: 7.4 g/dL (ref 6.1–8.1)

## 2015-11-15 LAB — BASIC METABOLIC PANEL
BUN: 11 mg/dL (ref 7–25)
CO2: 26 mmol/L (ref 20–31)
Calcium: 9.1 mg/dL (ref 8.6–10.3)
Chloride: 102 mmol/L (ref 98–110)
Creat: 1.23 mg/dL (ref 0.70–1.25)
Glucose, Bld: 99 mg/dL (ref 65–99)
Potassium: 3.6 mmol/L (ref 3.5–5.3)
Sodium: 137 mmol/L (ref 135–146)

## 2015-11-15 LAB — CBC
HCT: 40.5 % (ref 39.0–52.0)
Hemoglobin: 13.9 g/dL (ref 13.0–17.0)
MCH: 31 pg (ref 26.0–34.0)
MCHC: 34.3 g/dL (ref 30.0–36.0)
MCV: 90.4 fL (ref 78.0–100.0)
MPV: 9.5 fL (ref 8.6–12.4)
Platelets: 225 10*3/uL (ref 150–400)
RBC: 4.48 MIL/uL (ref 4.22–5.81)
RDW: 14 % (ref 11.5–15.5)
WBC: 8.6 10*3/uL (ref 4.0–10.5)

## 2015-11-15 NOTE — Patient Instructions (Addendum)
We will be checking the following labs today - BMET, CBC, HPF and lipids   Medication Instructions:    Continue with your current medicines.     Testing/Procedures To Be Arranged:  N/A  Follow-Up:   See Dr. Tamala Julian as planned in March.     Other Special Instructions:   Keep up the good work with walking  Keep working on your smoking    If you need a refill on your cardiac medications before your next appointment, please call your pharmacy.   Call the [place] office at [phone] if you have any questions, problems or concerns.

## 2015-11-15 NOTE — Progress Notes (Signed)
CARDIOLOGY OFFICE NOTE  Date:  11/15/2015    [name] Date of Birth: [birth year] Medical Record [id number]  PCP:  Gerrit Heck, MD  Cardiologist:  [place] but will be followed by Dr. Tamala Julian going forward (Dr. Tamala Julian saw his father)  Chief Complaint  Patient presents with  . Coronary Artery Disease    2 month check - seen for Dr.Smith  . Hyperlipidemia  . Hypertension    History of Present Illness: Joseph Fitzpatrick is a 64 y.o. male who presents today for a 2 month check. To be followed by Dr. Tamala Julian.   He has a h/o HTN, HL, borderline DM, tobacco abuse, and remote substance abuse.   He was in his USOH until the morning of 09/15/2015, when he developed substernal chest pressure radiating to his jaw bilaterally. He checked his BP and found it to be elevated at 180/100 and decided to seek medical attention. He presented to the [facility] ED where ECG showed poor R wave progression and inferior T wave inversion. Troponin was mildly elevated @ 0.14. He was seen by cardiology and admitted for further evaluation of acute coronary syndrome.  Patient ruled in for NSTEMI, eventually peaking his troponin at 4.26. He was placed on asa, heparin, bb, and high potency statin and underwent diagnostic catheterization on 10/21 revealing severe disease within the first obtuse marginal and moderate RCA disease. The OM1 was felt to be the culprit vessel and this was successfully treated with a 2.25 x 32 mm Promus Premier drug-eluting stent. Fractional flow reserve was measured across the RCA lesion and was normal at 0.93. Thus medical therapy was recommended.   I saw him back for his post hospital visit back in October - he was doing well. Not short of breath. Smoking less. No chest pain reported.   Comes in today. Here alone. Doing ok. Has almost quit smoking but is "vaping". Walking 30 minutes a day. Not interested in cardiac rehab - feels like he "would not show up". Fasting  today for his labs. No chest pain. Not short of breath. Tolerating his medicines. Having diarrhea - but had this prior to his MI. Probable IBS.   Past Medical History  Diagnosis Date  . Essential hypertension   . Tobacco abuse   . PVCs (premature ventricular contractions)   . Anxiety   . Depression   . BPH (benign prostatic hypertrophy)   . Hypokalemia   . Hematuria   . History of substance abuse   . IBS (irritable bowel syndrome)   . History of nephrolithiasis   . Hypercholesterolemia   . History of pneumonia 1993 X 1  . Borderline diabetes     a. 08/2015 HbA1c = 5.9.  . History of hiatal hernia     "suspected; never confirmed"  . GERD (gastroesophageal reflux disease)   . History of duodenal ulcer   . CAD (coronary artery disease)     a. 08/2015 NSTEMI/PCI: LM nl, LAD nl, RI nl, LCX 20p, OM1 95 (2.25x32 Pomus Premier DES), RCA 15m (FFR 0.93)->Med Rx, EF 55-65%.    Past Surgical History  Procedure Laterality Date  . Tonsillectomy    . Cholecystectomy open    . Cardiac catheterization N/A 09/16/2015    Procedure: Left Heart Cath and Coronary Angiography;  Surgeon: Peter M Martinique, MD;  Location: [place] [facility];  Service: Cardiovascular;  Laterality: N/A;  . Cardiac catheterization  09/16/2015    Procedure: Intravascular Pressure Wire/FFR Study;  Surgeon: [name]  M Martinique, MD;  Location: [place] [facility];  Service: Cardiovascular;;  . Cardiac catheterization  09/16/2015    Procedure: Coronary Stent Intervention;  Surgeon: Peter M Martinique, MD;  Location: [place] [facility];  Service: Cardiovascular;;     Medications: Current Outpatient Prescriptions  Medication Sig Dispense Refill  . amLODipine (NORVASC) 5 MG tablet Take 1 tablet (5 mg total) by mouth daily. 30 tablet 6  . aspirin EC 81 MG EC tablet Take 1 tablet (81 mg total) by mouth daily.    Marland Kitchen atorvastatin (LIPITOR) 80 MG tablet Take 1 tablet (80 mg total) by mouth daily at 6 PM. 30 tablet 6  . clonazePAM (KLONOPIN)  1 MG tablet Take 1-2 mg by mouth 2 (two) times daily. Pt takes 1mg  in am and 2mg  in pm    . hydrOXYzine (VISTARIL) 25 MG capsule Take 25-75 mg by mouth 3 (three) times daily. Pt takes 25mg  in am, 50mg  in afternoon and 75mg  at bedtime    . KLOR-CON M10 10 MEQ tablet Take 10 mEq by mouth daily.  1  . Loratadine (CLARITIN) 10 MG CAPS Take 10 mg by mouth daily.    Marland Kitchen losartan-hydrochlorothiazide (HYZAAR) 50-12.5 MG per tablet Take 1 tablet by mouth daily.     . metoprolol tartrate (LOPRESSOR) 25 MG tablet Take 0.5 tablets (12.5 mg total) by mouth 2 (two) times daily. 30 tablet 6  . nitroGLYCERIN (NITROSTAT) 0.4 MG SL tablet Place 1 tablet (0.4 mg total) under the tongue every 5 (five) minutes x 3 doses as needed for chest pain. 25 tablet 3  . Omega-3 Fatty Acids (FISH OIL) 1000 MG CAPS Take 2 capsules by mouth daily.    Marland Kitchen PARoxetine (PAXIL) 40 MG tablet Take 40 mg by mouth every evening.  11  . saw palmetto 160 MG capsule Take 160 mg by mouth 3 (three) times daily.    . ticagrelor (BRILINTA) 90 MG TABS tablet Take 1 tablet (90 mg total) by mouth 2 (two) times daily. 60 tablet 6  . vitamin B-12 (CYANOCOBALAMIN) 100 MCG tablet Take 100 mcg by mouth daily.     No current facility-administered medications for this visit.    Allergies: No Known Allergies  Social History: The patient  reports that he has been smoking Cigarettes.  He started smoking about 43 years ago. He has a 11.75 pack-year smoking history. He has never used smokeless tobacco. He reports that he drinks alcohol. He reports that he uses illicit drugs (Marijuana).   Family History: The patient's family history includes Alzheimer's disease in his mother; Anxiety disorder in his mother; CAD in his father; Depression in his father; High blood pressure in his mother.   Review of Systems: Please see the history of present illness.   Otherwise, the review of systems is positive for none.   All other systems are reviewed and negative.    Physical Exam: VS:  BP 118/74 mmHg  Pulse 62  Ht 5\' 10"  (1.778 m)  Wt 204 lb 12.8 oz (92.897 kg)  BMI 29.39 kg/m2  SpO2 98% .  BMI Body mass index is 29.39 kg/(m^2).  Wt Readings from Last 3 Encounters:  11/15/15 204 lb 12.8 oz (92.897 kg)  09/23/15 206 lb 3.2 oz (93.532 kg)  09/17/15 204 lb 9.4 oz (92.8 kg)    General: Pleasant. Well developed, well nourished and in no acute distress.  HEENT: Normal. Neck: Supple, no JVD, carotid bruits, or masses noted.  Cardiac: Regular rate and rhythm. No  murmurs, rubs, or gallops. No edema.  Respiratory:  Lungs are clear to auscultation bilaterally with normal work of breathing.  GI: Soft and nontender.  MS: No deformity or atrophy. Gait and ROM intact. Skin: Warm and dry. Color looks pale to me today Neuro:  Strength and sensation are intact and no gross focal deficits noted.  Psych: Alert, appropriate and with normal affect.   LABORATORY DATA:  EKG:  EKG is not ordered today.  Lab Results  Component Value Date   WBC 9.5 09/23/2015   HGB 14.1 09/23/2015   HCT 41.8 09/23/2015   PLT 241 09/23/2015   GLUCOSE 109* 09/23/2015   CHOL 195 09/16/2015   TRIG 74 09/16/2015   HDL 39* 09/16/2015   LDLCALC 141* 09/16/2015   ALT 18 02/20/2013   AST 16 02/20/2013   NA 137 09/23/2015   K 4.0 09/23/2015   CL 102 09/23/2015   CREATININE 1.18 09/23/2015   BUN 14 09/23/2015   CO2 26 09/23/2015   TSH 1.899 02/19/2013   INR 1.07 09/15/2015   HGBA1C 5.9* 09/16/2015    BNP (last 3 results) No results for input(s): BNP in the last 8760 hours.  ProBNP (last 3 results) No results for input(s): PROBNP in the last 8760 hours.   Other Studies Reviewed Today:  Procedures    Coronary Stent Intervention   Intravascular Pressure Wire/FFR Study   Left Heart Cath and Coronary Angiography    Conclusion     Prox Cx lesion, 20% stenosed.  The left ventricular systolic function is normal.  Mid RCA lesion, 60%  stenosed.  1st Mrg lesion, 95% stenosed. Post intervention, there is a 0% residual stenosis.  1. Single vessel obstructive CAD involving the first OM 2. Borderline eccentric stenosis in the mid RCA with normal FFR of 0.93. 3. Normal LV function 4. Successful stenting of the first OM with a DES.  Plan: DAPT for one year. Aggressive risk factor modification. Anticipate DC in am.     Assessment/Plan: 1. NSTEMI - with PCI to 1st OM and residual disease in the RCA with normal FFR - he is on DAPT without issue - he is doing well clinically with no complaints of chest pain. I have left him on his current regimen.   2. HLD - now on high dose statin - needs labs today - he is fasting.   3. Smoking - says he has cut down but is "vaping". Total cessatino encouraged.   4. HTN - ok on his current regimen. I have left him on his current regimen.   5. Borderline DM - trying to make dietary changes.       Current medicines are reviewed with the patient today.  The patient does not have concerns regarding medicines other than what has been noted above.  The following changes have been made:  See above.  Labs/ tests ordered today include:    Orders Placed This Encounter  Procedures  . Basic metabolic panel  . Hepatic function panel  . Lipid panel  . CBC     Disposition:   FU with Dr. Tamala Julian going forward.   Patient is agreeable to this plan and will call if any problems develop in the interim.   Signed: Burtis Junes, RN, ANP-C 11/15/2015 8:50 AM  [facility] [address] [place], [place]  [address] Phone: [phone] Fax: [phone]

## 2016-01-31 ENCOUNTER — Other Ambulatory Visit: Payer: Self-pay | Admitting: *Deleted

## 2016-01-31 ENCOUNTER — Other Ambulatory Visit: Payer: Self-pay

## 2016-01-31 MED ORDER — ATORVASTATIN CALCIUM 80 MG PO TABS: 80.0000 mg | ORAL_TABLET | Freq: Every day | ORAL | Status: DC

## 2016-01-31 MED ORDER — AMLODIPINE BESYLATE 5 MG PO TABS: 5.0000 mg | ORAL_TABLET | Freq: Every day | ORAL | Status: DC

## 2016-02-03 ENCOUNTER — Other Ambulatory Visit: Payer: Self-pay | Admitting: *Deleted

## 2016-02-03 ENCOUNTER — Other Ambulatory Visit: Payer: Self-pay | Admitting: Nurse Practitioner

## 2016-02-03 ENCOUNTER — Other Ambulatory Visit (HOSPITAL_COMMUNITY): Payer: Self-pay | Admitting: Psychiatry

## 2016-02-03 MED ORDER — AMLODIPINE BESYLATE 5 MG PO TABS: 5.0000 mg | ORAL_TABLET | Freq: Every day | ORAL | Status: DC

## 2016-02-06 ENCOUNTER — Other Ambulatory Visit (HOSPITAL_COMMUNITY): Payer: Self-pay | Admitting: Psychiatry

## 2016-02-13 ENCOUNTER — Other Ambulatory Visit: Payer: Self-pay

## 2016-02-13 MED ORDER — TICAGRELOR 90 MG PO TABS: 90.0000 mg | ORAL_TABLET | Freq: Two times a day (BID) | ORAL | Status: DC

## 2016-02-16 ENCOUNTER — Encounter: Payer: Self-pay | Admitting: Interventional Cardiology

## 2016-02-16 ENCOUNTER — Ambulatory Visit (INDEPENDENT_AMBULATORY_CARE_PROVIDER_SITE_OTHER): Payer: 59 | Admitting: Interventional Cardiology

## 2016-02-16 VITALS — BP 128/66 | HR 62 | Ht 71.0 in | Wt 208.4 lb

## 2016-02-16 DIAGNOSIS — E785 Hyperlipidemia, unspecified: Secondary | ICD-10-CM | POA: Diagnosis not present

## 2016-02-16 DIAGNOSIS — Z955 Presence of coronary angioplasty implant and graft: Secondary | ICD-10-CM

## 2016-02-16 DIAGNOSIS — I10 Essential (primary) hypertension: Secondary | ICD-10-CM

## 2016-02-16 NOTE — Patient Instructions (Addendum)
Medication Instructions:  Your physician has recommended you make the following change in your medication:  STOP Metoprolol   Labwork: None ordered  Testing/Procedures: Your physician has recommended that you have a sleep study. This test records several body functions during sleep, including: brain activity, eye movement, oxygen and carbon dioxide blood levels, heart rate and rhythm, breathing rate and rhythm, the flow of air through your mouth and nose, snoring, body muscle movements, and chest and belly movement. Joseph Fitzpatrick will call you to schedule)   Follow-Up: Your physician wants you to follow-up in: 6 months with Dr.Smith You will receive a reminder letter in the mail two months in advance. If you don't receive a letter, please call our office to schedule the follow-up appointment.   Any Other Special Instructions Will Be Listed Below (If Applicable). Your physician discussed the importance of regular exercise and recommended that you start or continue a regular exercise program for good health.       If you need a refill on your cardiac medications before your next appointment, please call your pharmacy.

## 2016-02-16 NOTE — Progress Notes (Signed)
Cardiology Office Note   Date:  02/16/2016   ID:  [name], DOB [birth year], MRN [id number]  PCP:  Gerrit Heck, MD  Cardiologist:  Sinclair Grooms, MD   Chief Complaint  Patient presents with  . Coronary Artery Disease      History of Present Illness: Joseph Fitzpatrick is a 65 y.o. male who presents for CAD with recent stent to the circumflex, non-ST elevation MI, essential hypertension, depression, tobacco abuse, and hyperlipidemia.  Overall doing relatively well. No recurrence of angina. No specific medication side effects. Not being is physically active as we have encouraged. Had episodes of syncope nor needed sublingual nitroglycerin.    Past Medical History  Diagnosis Date  . Essential hypertension   . Tobacco abuse   . PVCs (premature ventricular contractions)   . Anxiety   . Depression   . BPH (benign prostatic hypertrophy)   . Hypokalemia   . Hematuria   . History of substance abuse   . IBS (irritable bowel syndrome)   . History of nephrolithiasis   . Hypercholesterolemia   . History of pneumonia 1993 X 1  . Borderline diabetes     a. 08/2015 HbA1c = 5.9.  . History of hiatal hernia     "suspected; never confirmed"  . GERD (gastroesophageal reflux disease)   . History of duodenal ulcer   . CAD (coronary artery disease)     a. 08/2015 NSTEMI/PCI: LM nl, LAD nl, RI nl, LCX 20p, OM1 95 (2.25x32 Pomus Premier DES), RCA 6m (FFR 0.93)->Med Rx, EF 55-65%.    Past Surgical History  Procedure Laterality Date  . Tonsillectomy    . Cholecystectomy open    . Cardiac catheterization N/A 09/16/2015    Procedure: Left Heart Cath and Coronary Angiography;  Surgeon: Peter M Martinique, MD;  Location: [place] [facility];  Service: Cardiovascular;  Laterality: N/A;  . Cardiac catheterization  09/16/2015    Procedure: Intravascular Pressure Wire/FFR Study;  Surgeon: Peter M Martinique, MD;  Location: [place];  Service: Cardiovascular;;  .  Cardiac catheterization  09/16/2015    Procedure: Coronary Stent Intervention;  Surgeon: Peter M Martinique, MD;  Location: [place] [facility];  Service: Cardiovascular;;     Current Outpatient Prescriptions  Medication Sig Dispense Refill  . amLODipine (NORVASC) 5 MG tablet Take 1 tablet (5 mg total) by mouth daily. 90 tablet 2  . aspirin EC 81 MG EC tablet Take 1 tablet (81 mg total) by mouth daily.    Marland Kitchen atorvastatin (LIPITOR) 80 MG tablet Take 1 tablet (80 mg total) by mouth daily at 6 PM. 30 tablet 0  . clonazePAM (KLONOPIN) 1 MG tablet Take 1-2 mg by mouth 2 (two) times daily. Pt takes 1mg  in am and 2mg  in pm    . hydrOXYzine (VISTARIL) 25 MG capsule Take 25-75 mg by mouth 3 (three) times daily. Pt takes 25mg  in am, 50mg  in afternoon and 75mg  at bedtime    . KLOR-CON M10 10 MEQ tablet Take 10 mEq by mouth daily.  1  . Loratadine (CLARITIN) 10 MG CAPS Take 10 mg by mouth daily.    Marland Kitchen losartan-hydrochlorothiazide (HYZAAR) 50-12.5 MG per tablet Take 1 tablet by mouth daily.     . metoprolol tartrate (LOPRESSOR) 25 MG tablet Take 0.5 tablets (12.5 mg total) by mouth 2 (two) times daily. 30 tablet 6  . nitroGLYCERIN (NITROSTAT) 0.4 MG SL tablet Place 1 tablet (0.4 mg total) under the tongue every  5 (five) minutes x 3 doses as needed for chest pain. 25 tablet 3  . Omega-3 Fatty Acids (FISH OIL) 1000 MG CAPS Take 2 capsules by mouth daily.    Marland Kitchen PARoxetine (PAXIL) 40 MG tablet Take 40 mg by mouth every evening.  11  . saw palmetto 160 MG capsule Take 160 mg by mouth 3 (three) times daily.    . ticagrelor (BRILINTA) 90 MG TABS tablet Take 1 tablet (90 mg total) by mouth 2 (two) times daily. 60 tablet 6  . vitamin B-12 (CYANOCOBALAMIN) 100 MCG tablet Take 100 mcg by mouth daily.     No current facility-administered medications for this visit.    Allergies:   Review of patient's allergies indicates no known allergies.    Social History:  The patient  reports that he has been smoking Cigarettes.  He  started smoking about 44 years ago. He has a 11.75 pack-year smoking history. He has never used smokeless tobacco. He reports that he drinks alcohol. He reports that he uses illicit drugs (Marijuana).   Family History:  The patient's family history includes Alzheimer's disease in his mother; Anxiety disorder in his mother; CAD in his father; Depression in his father; High blood pressure in his mother.    ROS:  Please see the history of present illness.   Otherwise, review of systems are positive for has decreased cigarette smoking down at 2 cigarettes per day. He does snore. Says his wife believes she stops breathing at times. Anxiety, headaches, diarrhea, and gas of been a particular problem..   All other systems are reviewed and negative.    PHYSICAL EXAM: VS:  BP 128/66 mmHg  Pulse 62  Ht 5\' 11"  (1.803 m)  Wt 208 lb 6.4 oz (94.53 kg)  BMI 29.08 kg/m2 , BMI Body mass index is 29.08 kg/(m^2). GEN: Well nourished, well developed, in no acute distress HEENT: normal Neck: no JVD, carotid bruits, or masses Cardiac: RRR.  There is no murmur, rub, or gallop. There is no edema. Respiratory:  clear to auscultation bilaterally, normal work of breathing. GI: soft, nontender, nondistended, + BS MS: no deformity or atrophy Skin: warm and dry, no rash Neuro:  Strength and sensation are intact Psych: euthymic mood, full affect   EKG:  EKG is not ordered today.    Recent Labs: 11/15/2015: ALT 17; BUN 11; Creat 1.23; Hemoglobin 13.9; Platelets 225; Potassium 3.6; Sodium 137    Lipid Panel    Component Value Date/Time   CHOL 88* 11/15/2015 0857   TRIG 54 11/15/2015 0857   HDL 41 11/15/2015 0857   CHOLHDL 2.1 11/15/2015 0857   VLDL 11 11/15/2015 0857   LDLCALC 36 11/15/2015 0857      Wt Readings from Last 3 Encounters:  02/16/16 208 lb 6.4 oz (94.53 kg)  11/15/15 204 lb 12.8 oz (92.897 kg)  09/23/15 206 lb 3.2 oz (93.532 kg)      Other studies Reviewed: Additional studies/  records that were reviewed today include: Hospital records. The findings include reviewed cath results. No significant residual disease identified..    ASSESSMENT AND PLAN:  1. S/P coronary artery stent placement Denies angina  2. Essential hypertension Excellent control  3. HLD (hyperlipidemia) Excellent control  4. Snoring and excessive daytime sleepiness. Also vivid dreams Rule out sleep apnea  Current medicines are reviewed at length with the patient today.  The patient has the following concerns regarding medicines: Discontinue metoprolol.  The following changes/actions have been instituted:  Discontinue metoprolol  Sleep study to rule out obstructive sleep apnea  Aerobic activity  Labs/ tests ordered today include:  No orders of the defined types were placed in this encounter.     Disposition:   FU with HS in 6 months  Signed, Sinclair Grooms, MD  02/16/2016 11:06 AM    [facility] [place], [place], [place]  [address] Phone: [phone]; Fax: [phone]

## 2016-02-27 ENCOUNTER — Other Ambulatory Visit: Payer: Self-pay

## 2016-02-27 MED ORDER — ATORVASTATIN CALCIUM 80 MG PO TABS: 80.0000 mg | ORAL_TABLET | Freq: Every day | ORAL | Status: DC

## 2016-03-02 ENCOUNTER — Other Ambulatory Visit: Payer: Self-pay | Admitting: *Deleted

## 2016-03-02 DIAGNOSIS — G4733 Obstructive sleep apnea (adult) (pediatric): Secondary | ICD-10-CM

## 2016-04-26 ENCOUNTER — Encounter (HOSPITAL_BASED_OUTPATIENT_CLINIC_OR_DEPARTMENT_OTHER): Payer: 59

## 2016-05-07 ENCOUNTER — Ambulatory Visit (HOSPITAL_BASED_OUTPATIENT_CLINIC_OR_DEPARTMENT_OTHER): Payer: 59 | Attending: Interventional Cardiology | Admitting: Cardiovascular Disease

## 2016-05-07 DIAGNOSIS — G4733 Obstructive sleep apnea (adult) (pediatric): Secondary | ICD-10-CM | POA: Diagnosis present

## 2016-05-07 DIAGNOSIS — I10 Essential (primary) hypertension: Secondary | ICD-10-CM | POA: Diagnosis not present

## 2016-05-07 DIAGNOSIS — Z79899 Other long term (current) drug therapy: Secondary | ICD-10-CM | POA: Insufficient documentation

## 2016-05-07 DIAGNOSIS — E119 Type 2 diabetes mellitus without complications: Secondary | ICD-10-CM | POA: Insufficient documentation

## 2016-05-07 DIAGNOSIS — Z7982 Long term (current) use of aspirin: Secondary | ICD-10-CM | POA: Diagnosis not present

## 2016-05-07 DIAGNOSIS — R0683 Snoring: Secondary | ICD-10-CM | POA: Insufficient documentation

## 2016-05-07 DIAGNOSIS — G4719 Other hypersomnia: Secondary | ICD-10-CM | POA: Diagnosis not present

## 2016-05-09 ENCOUNTER — Encounter (HOSPITAL_BASED_OUTPATIENT_CLINIC_OR_DEPARTMENT_OTHER): Payer: Self-pay | Admitting: Cardiovascular Disease

## 2016-05-09 NOTE — Procedures (Signed)
Patient Name: Joseph Fitzpatrick, Joseph Fitzpatrick Date: 05/07/2016 Gender: Male D.O.B: [birth year] Age (years): 64 Referring Provider: Daneen Schick Height (inches): 71 Interpreting Physician: Shelva Majestic MD, ABSM Weight (lbs): 205 RPSGT: Carolin Coy BMI: 29 MRN: [id number] Neck Size: 17.00  CLINICAL INFORMATION Sleep Study Type: NPSG Indication for sleep study: Diabetes, Excessive Daytime Sleepiness, Hypertension, OSA, Snoring Epworth Sleepiness Score: 9  SLEEP STUDY TECHNIQUE As per the AASM Manual for the Scoring of Sleep and Associated Events v2.3 (April 2016) with a hypopnea requiring 4% desaturations. The channels recorded and monitored were frontal, central and occipital EEG, electrooculogram (EOG), submentalis EMG (chin), nasal and oral airflow, thoracic and abdominal wall motion, anterior tibialis EMG, snore microphone, electrocardiogram, and pulse oximetry.  MEDICATIONS  vitamin B-12 (CYANOCOBALAMIN) 100 MCG tablet 100 mcg, Daily     ticagrelor (BRILINTA) 90 MG TABS tablet 90 mg, 2 times daily     saw palmetto 160 MG capsule 160 mg, 3 times daily     PARoxetine (PAXIL) 40 MG tablet 40 mg, Every evening     Note: Received from: External Pharmacy (Written 09/15/2015 1125)   Omega-3 Fatty Acids (FISH OIL) 1000 MG CAPS 2 capsule, Daily     nitroGLYCERIN (NITROSTAT) 0.4 MG SL tablet 0.4 mg, Every 5 min x3 PRN     losartan-hydrochlorothiazide (HYZAAR) 50-12.5 MG per tablet 1 tablet, Daily     Note: Received from: External Pharmacy (Written 01/05/2014 1434)   Loratadine (CLARITIN) 10 MG CAPS 10 mg, Daily     KLOR-CON M10 10 MEQ tablet 10 mEq, Daily     Note: Received from: External Pharmacy (Written 09/15/2015 1125)   hydrOXYzine (VISTARIL) 25 MG capsule 25-75 mg, 3 times daily     Note: Received from: External Pharmacy (Written 01/05/2014 1434)  onazePAM (KLONOPIN) 1 MG tablet 1-2 mg, 2 times daily     Note: Received from: External Pharmacy (Written 01/05/2014 1434)   atorvastatin  (LIPITOR) 80 MG tablet 80 mg, Daily-1800     aspirin EC 81 MG EC tablet 81 mg, Daily     amLODipine (NORVASC) 5 MG tablet 5 mg, Daily   Medications self-administered by patient during sleep study : No sleep medicine administered.  SLEEP ARCHITECTURE The study was initiated at 10:09:12 PM and ended at 4:49:28 AM. Sleep onset time was 37.8 minutes and the sleep efficiency was 60.8%. The total sleep time was 243.5 minutes. Wake after sleep onset (WASO) was 118.9 minutes Stage REM latency was 144.5 minutes. The patient spent 33.06% of the night in stage N1 sleep, 55.03% in stage N2 sleep, 0.00% in stage N3 and 11.91% in REM. Alpha intrusion was absent. Supine sleep was 10.47%.  RESPIRATORY PARAMETERS The overall apnea/hypopnea index (AHI) was 12.8 per hour. There were 30 total apneas, including 8 obstructive, 6 central and 16 mixed apneas. There were 22 hypopneas and 5 RERAs. The AHI during Stage REM sleep was 2.1 per hour. AHI while supine was 98.8 per hour. The mean oxygen saturation was 93.36%. The minimum SpO2 during sleep was 85.00%. Soft snoring was noted during this study.  CARDIAC DATA The 2 lead EKG demonstrated sinus rhythm. The mean heart rate was 63.27 beats per minute. Other EKG findings include: None.  LEG MOVEMENT DATA The total PLMS were 404 with a resulting PLMS index of 99.55. Associated arousal with leg movement index was 5.2 .  IMPRESSIONS - Mild obstructive sleep apnea occurred during this study (AHI = 12.8/h); events were severe with supine position (AHI 98.8/h). - No significant central  sleep apnea occurred during this study (CAI = 1.5/h). - Mild oxygen desaturation  to a nadir of 85.00%. - Reduced sleep efficiency at 60.8%. - Abnormal sleep architecture with absence of slow-wave sleep and prolonged latency to a REM sleep. - The patient snored with Soft snoring volume. - No cardiac abnormalities were noted during this study. - Severe periodic limb movements of  sleep occurred during the study. Associated arousals were significant. - The arousal index was abnormal  DIAGNOSIS - Obstructive Sleep Apnea (327.23 [G47.33 ICD-10])  RECOMMENDATIONS - Recommend therapeutic CPAP titration to determine optimal pressure required to alleviate sleep disordered breathing. - Efforts should be made to optimize nasal and oropharyngeal patency. - The patient should be counseled to avoid supine sleep; consider positional therapy avoiding supine position during sleep. - Avoid alcohol, sedatives and other CNS depressants that may worsen sleep apnea and disrupt normal sleep architecture. - Sleep hygiene should be reviewed to assess factors that may improve sleep quality. - Weight management and regular exercise should be initiated or continued if appropriate.   Troy Sine, MD, [place], American Board of Sleep Medicine  ELECTRONICALLY SIGNED ON:  05/09/2016, 9:52 PM [place] PH: [phone]   FX: [phone] [place]

## 2016-05-15 ENCOUNTER — Telehealth: Payer: Self-pay | Admitting: *Deleted

## 2016-05-15 NOTE — Progress Notes (Signed)
Left message to call.

## 2016-05-15 NOTE — Telephone Encounter (Signed)
Left message to return a call to discuss sleep study results. 

## 2016-05-16 ENCOUNTER — Telehealth: Payer: Self-pay | Admitting: Interventional Cardiology

## 2016-05-16 ENCOUNTER — Other Ambulatory Visit: Payer: Self-pay | Admitting: *Deleted

## 2016-05-16 DIAGNOSIS — G4733 Obstructive sleep apnea (adult) (pediatric): Secondary | ICD-10-CM

## 2016-05-16 NOTE — Progress Notes (Signed)
Patient notified of sleep results and recommendations. 

## 2016-05-16 NOTE — Telephone Encounter (Signed)
Left message for Radonna Ricker that Dr. Tamala Julian has filled out clearance form and it has been faxed to [phone].

## 2016-05-16 NOTE — Telephone Encounter (Signed)
Joseph Fitzpatrick is calling back to speak with a nurse about the pt holding his Brilinta. Please f/u with her.

## 2016-05-16 NOTE — Telephone Encounter (Signed)
Follow-up    1. What dental office are you calling from?Dr. Candis Musa office  2. What is your office phone and fax number? [id number] number [phone]  3. What type of procedure is the patient having performed? tooth exaceration  4. What date is procedure scheduled? Friday June 23 at 11:00  5. What is your question (ex. Antibiotics prior to procedure, holding medication-we need to know how long dentist wants pt to hold med)? Pt is on Bralanta but according to off ice and history of the pt the pt can not come off the medication they are checking to make sure this is correct

## 2016-06-28 ENCOUNTER — Ambulatory Visit (HOSPITAL_BASED_OUTPATIENT_CLINIC_OR_DEPARTMENT_OTHER): Payer: 59 | Attending: Cardiovascular Disease | Admitting: Cardiovascular Disease

## 2016-06-28 VITALS — Ht 71.0 in | Wt 208.0 lb

## 2016-06-28 DIAGNOSIS — Z7982 Long term (current) use of aspirin: Secondary | ICD-10-CM | POA: Insufficient documentation

## 2016-06-28 DIAGNOSIS — R0683 Snoring: Secondary | ICD-10-CM | POA: Diagnosis not present

## 2016-06-28 DIAGNOSIS — Z79899 Other long term (current) drug therapy: Secondary | ICD-10-CM | POA: Diagnosis not present

## 2016-06-28 DIAGNOSIS — G4730 Sleep apnea, unspecified: Secondary | ICD-10-CM | POA: Diagnosis present

## 2016-06-28 DIAGNOSIS — G4733 Obstructive sleep apnea (adult) (pediatric): Secondary | ICD-10-CM

## 2016-07-05 ENCOUNTER — Encounter (HOSPITAL_BASED_OUTPATIENT_CLINIC_OR_DEPARTMENT_OTHER): Payer: 59

## 2016-07-05 NOTE — Procedures (Signed)
Patient Name: Joseph Fitzpatrick, Joseph Fitzpatrick Date: 06/28/2016 Gender: Male D.O.B: [birth year] Age (years): 67 Referring Provider: Shelva Majestic MD, ABSM Height (inches): 71 Interpreting Physician: Shelva Majestic MD, ABSM Weight (lbs): 205 RPSGT: Gerhard Perches BMI: 29 MRN: [id number] Neck Size: 17.00  CLINICAL INFORMATION The patient is referred for a CPAP titration to treat sleep apnea.   Date of NPSG, Split Night or HST:  05/07/2016  AHI 12.8/h; RDI 14.0/h   SLEEP STUDY TECHNIQUE As per the SM Manual for the Scoring of Sleep and Associated Events v2.3 (April 2016) with a hypopnea requiring 4% desaturations. The channels recorded and monitored were frontal, central and occipital EEG, electrooculogram (EOG), submentalis EMG (chin), nasal and oral airflow, thoracic and abdominal wall motion, anterior tibialis EMG, snore microphone, electrocardiogram, and pulse oximetry. Continuous positive airway pressure (CPAP) was initiated at the beginning of the study and titrated to treat sleep-disordered breathing.  MEDICATIONS amLODipine (NORVASC) 5 MG tablet Taking 02/03/16 -- Burtis Junes, NP Take 1 tablet (5 mg total) by mouth daily. aspirin EC 81 MG EC tablet Taking 09/17/15 -- Rogelia Mire, NP Take 1 tablet (81 mg total) by mouth daily. atorvastatin (LIPITOR) 80 MG tablet 02/27/16 -- Belva Crome, MD Take 1 tablet (80 mg total) by mouth daily at 6 PM. clonazePAM (KLONOPIN) 1 MG tablet Taking 12/05/13 -- Historical Provider, MD Notes:  Received from: External Pharmacy hydrOXYzine (VISTARIL) 25 MG capsule Taking 01/02/14 -- Historical Provider, MD Notes:  Received from: External Pharmacy KLOR-CON M10 10 MEQ tablet Taking 09/06/15 -- Historical Provider, MD Notes:  Received from: External Pharmacy Loratadine (CLARITIN) 10 MG CAPS Taking -- -- Historical Provider, MD losartan-hydrochlorothiazide Konrad Penta) 50-12.5 MG per tablet Taking 10/10/13 -- Historical Provider, MD Notes:   Received from: External Pharmacy nitroGLYCERIN (NITROSTAT) 0.4 MG SL tablet Taking 09/17/15 -- Rogelia Mire, NP Place 1 tablet (0.4 mg total) under the tongue every 5 (five) minutes x 3 doses as needed for chest pain. Omega-3 Fatty Acids (FISH OIL) 1000 MG CAPS Taking -- -- Historical Provider, MD PARoxetine (PAXIL) 40 MG tablet Taking 08/28/15 -- Historical Provider, MD Notes:  Received from: External Pharmacy saw palmetto 160 MG capsule Taking -- -- Historical Provider, MD ticagrelor (BRILINTA) 90 MG TABS tablet Taking 02/13/16 -- Rogelia Mire, NP Take 1 tablet (90 mg total) by mouth 2 (two) times daily. vitamin B-12 (CYANOCOBALAMIN) 100 MCG tablet  Medications administered by patient during sleep study : No sleep medicine administered.  TECHNICIAN COMMENTS Comments added by technician: none  Comments added by scorer: N/A  RESPIRATORY PARAMETERS Optimal PAP Pressure (cm): 13 AHI at Optimal Pressure (/hr): 0.0 Overall Minimal O2 (%): 89.00 Supine % at Optimal Pressure (%): 0 Minimal O2 at Optimal Pressure (%): 92.0    SL EEP ARCHITECTURE The study was initiated at 10:43:01 PM and ended at 5:14:37 AM. Sleep onset time was 35.7 minutes and the sleep efficiency was 82.2%. The total sleep time was 322.0 minutes. Wakr after sleep onset (WASO) was 33.9 minutes. The patient spent 12.58% of the night in stage N1 sleep, 79.97% in stage N2 sleep, 0.00% in stage N3 and 7.45% in REM.Stage REM latency was 240.0 minutes Wake after sleep onset was 33.9. Alpha intrusion was absent. Supine sleep was 42.55%.  CARDIAC DATA The 2 lead EKG demonstrated sinus rhythm. The mean heart rate was 64.22 beats per minute. Other EKG findings include: None.  LEG MOVEMENT DATA The total Periodic Limb Movements of Sleep (PLMS) were 64. The PLMS index  was 11.93. A PLMS index of <15 is considered normal in adults.  IMPRESSIONS - The optimal PAP pressure was 13 cm of water. - Central sleep apnea was  not noted during this titration (CAI = 1.7/h). - Mild oxygen desaturations were observed during this titration (min O2 = 89.00%). - The patient snored with Soft snoring volume during this titration study with resolution at 13 cm water presssure. - No cardiac abnormalities were observed during this study. - Mild periodic limb movements were observed during this study. Arousals associated with PLMs were rare.  DIAGNOSIS - Obstructive Sleep Apnea (327.23 [G47.33 ICD-10])  RECOMMENDATIONS - Recommend an initial trial of CPAP therapy with EPR of 3 at 13 cm H2O with heated humidification.  A Medium size Resmed Full Face Mask AirFit F20 mask was ued for the titration. - Avoid alcohol, sedatives and other CNS depressants that may worsen sleep apnea and disrupt normal sleep architecture. - Sleep hygiene should be reviewed to assess factors that may improve sleep quality. - Weight management and regular exercise should be initiated or continued. - Recommend a download be obtained in 30 days and sleep clinic evaluation.  [Electronically signed] 07/05/2016 09:45 PM  Shelva Majestic MD, Tomasa Hose Diplomate, American Board of Sleep Medicine  NPI: [id number]  [place] PH: [phone]   FX: [phone] [place]

## 2016-07-23 ENCOUNTER — Telehealth: Payer: Self-pay | Admitting: Cardiovascular Disease

## 2016-07-23 NOTE — Telephone Encounter (Signed)
Patient is waiting for his sleep study results.  Had study done about 3 weeks ago.

## 2016-08-17 NOTE — Telephone Encounter (Signed)
Spoke with [facility] they have already set this patient up.

## 2016-08-20 ENCOUNTER — Other Ambulatory Visit: Payer: Self-pay | Admitting: Nurse Practitioner

## 2016-08-24 ENCOUNTER — Other Ambulatory Visit: Payer: Self-pay | Admitting: Interventional Cardiology

## 2016-10-15 ENCOUNTER — Encounter: Payer: Self-pay | Admitting: Cardiovascular Disease

## 2016-10-15 ENCOUNTER — Ambulatory Visit (INDEPENDENT_AMBULATORY_CARE_PROVIDER_SITE_OTHER): Payer: 59 | Admitting: Cardiovascular Disease

## 2016-10-15 VITALS — BP 120/72 | HR 100 | Ht 71.0 in | Wt 214.8 lb

## 2016-10-15 DIAGNOSIS — I2510 Atherosclerotic heart disease of native coronary artery without angina pectoris: Secondary | ICD-10-CM

## 2016-10-15 DIAGNOSIS — Z955 Presence of coronary angioplasty implant and graft: Secondary | ICD-10-CM

## 2016-10-15 DIAGNOSIS — E785 Hyperlipidemia, unspecified: Secondary | ICD-10-CM

## 2016-10-15 DIAGNOSIS — G4733 Obstructive sleep apnea (adult) (pediatric): Secondary | ICD-10-CM

## 2016-10-15 DIAGNOSIS — Z720 Tobacco use: Secondary | ICD-10-CM

## 2016-10-15 NOTE — Patient Instructions (Signed)
Your physician wants you to follow-up in: 1 year in or sooner if needed in office sleep clinic. You will receive a reminder letter in the mail two months in advance. If you don't receive a letter, please call our office to schedule the follow-up appointment.

## 2016-10-17 ENCOUNTER — Other Ambulatory Visit: Payer: Self-pay | Admitting: Family Medicine

## 2016-10-17 DIAGNOSIS — I739 Peripheral vascular disease, unspecified: Secondary | ICD-10-CM

## 2016-10-17 NOTE — Progress Notes (Signed)
Date:  10/17/2016   ID:  Joseph Fitzpatrick, DOB 09/09/1951, MRN [id number]  PCP:  Gerrit Heck, MD  Cardiologist:  Shelva Majestic, MD (sleep); Sinclair Grooms, MD  Sleep Clinic Evaluation   HPI: Joseph Fitzpatrick is a 65 y.o. male  who presents to sleep clinic following initiation of CPAP therapy for obstructive sleep apnea.  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  When last seen by Dr. Tamala Julian, due to concerns for obstructive sleep apnea.  He was referred for a sleep study.  He underwent a diagnostic polysomnogram on 05/07/2016 which revealed mild obstructive sleep apnea overall with an AHI of 12.8 per hour; however, events were very severe with supine position with an HI of 98.8.  There was oxygen desaturated to a nadir of 85%.  He subsequent underwent a CPAP titration trial on 06/28/2016 and an initial pressure.  Recommendation was 13 cm water pressure.  He has [place] [employer] at his insurance and [facility] as his DME provider.  His CPAP set up date was 08/13/2016.  He has a ResMed air filled after 20 medium size mask.  A download from 09/03/2016 through 10/02/2016 shows that he is meeting compliance with 87% of usage stays and 73% of usage days greater than 4 hours.  His AHI at 13 cm, however, was still slightly increased at 6.8.  Due to an apneic index of 6.0, and hypopnea index of 0.8.  He denies residual sleepiness.  His sleep is more restorative.  He is unaware of breakthrough snoring. An Epworth Sleepiness Scale score was calculated and endorsed today as shown below at 5, arguing against residual daytime sleepiness.  Epworth Sleepiness Scale: Situation   Chance of Dozing/Sleeping (0 = never , 1 = slight chance , 2 = moderate chance , 3 = high chance )   sitting and  reading 0   watching TV 2   sitting inactive in a public place 0   being a passenger in a motor vehicle for an hour or more 0   lying down in the afternoon 3   sitting and talking to someone 0   sitting quietly after lunch (no alcohol) 0   while stopped for a few minutes in traffic as the driver 0   Total Score  5    Past Medical History:  Diagnosis Date  . Anxiety   . Borderline diabetes    a. 08/2015 HbA1c = 5.9.  . BPH (benign prostatic hypertrophy)   . CAD (coronary artery disease)    a. 08/2015 NSTEMI/PCI: LM nl, LAD nl, RI nl, LCX 20p, OM1 95 (2.25x32 Pomus Premier DES), RCA 39m(FFR 0.93)->Med Rx, EF 55-65%.  . Depression   . Essential hypertension   . GERD (gastroesophageal reflux disease)   . Hematuria   . History of duodenal ulcer   . History of hiatal hernia    "suspected; never confirmed"  . History of nephrolithiasis   . History of pneumonia 1993 X 1  . History of substance abuse   . Hypercholesterolemia   . Hypokalemia   . IBS (irritable bowel syndrome)   . PVCs (premature ventricular contractions)   . Tobacco abuse     Past Surgical History:  Procedure Laterality Date  . CARDIAC CATHETERIZATION N/A 09/16/2015   Procedure: Left Heart Cath  and Coronary Angiography;  Surgeon: Peter M Martinique, MD;  Location: [facility];  Service: Cardiovascular;  Laterality: N/A;  . CARDIAC CATHETERIZATION  09/16/2015   Procedure: Intravascular Pressure Wire/FFR Study;  Surgeon: Peter M Martinique, MD;  Location: [facility];  Service: Cardiovascular;;  . CARDIAC CATHETERIZATION  09/16/2015   Procedure: Coronary Stent Intervention;  Surgeon: Peter M Martinique, MD;  Location: [facility];  Service: Cardiovascular;;  . CHOLECYSTECTOMY OPEN    . TONSILLECTOMY      No Known Allergies  Current Outpatient Prescriptions  Medication Sig Dispense Refill  . amLODipine (NORVASC) 5 MG tablet Take 1 tablet (5 mg total) by mouth daily. 90 tablet 2  . aspirin EC 81 MG EC  tablet Take 1 tablet (81 mg total) by mouth daily.    Marland Kitchen atorvastatin (LIPITOR) 80 MG tablet TAKE 1 TABLET (80 MG TOTAL) BY MOUTH DAILY AT 6 PM. 90 tablet 1  . BRILINTA 90 MG TABS tablet TAKE 1 TABLET (90 MG TOTAL) BY MOUTH 2 (TWO) TIMES DAILY. 60 tablet 15  . clonazePAM (KLONOPIN) 1 MG tablet Take 1-2 mg by mouth 2 (two) times daily. Pt takes 20m in am and 216min pm    . hydrOXYzine (VISTARIL) 25 MG capsule Take 25-75 mg by mouth 3 (three) times daily. Pt takes 2519mn am, 76m28m afternoon and 75mg90mbedtime    . KLOR-CON M10 10 MEQ tablet Take 10 mEq by mouth daily.  1  . Loratadine (CLARITIN) 10 MG CAPS Take 10 mg by mouth daily.    . losMarland Kitchenrtan-hydrochlorothiazide (HYZAAR) 50-12.5 MG per tablet Take 1 tablet by mouth daily.     . nitroGLYCERIN (NITROSTAT) 0.4 MG SL tablet Place 1 tablet (0.4 mg total) under the tongue every 5 (five) minutes x 3 doses as needed for chest pain. 25 tablet 3  . Omega-3 Fatty Acids (FISH OIL) 1000 MG CAPS Take 2 capsules by mouth daily.    . PARMarland Kitchenxetine (PAXIL) 40 MG tablet Take 40 mg by mouth every evening.  11  . saw palmetto 160 MG capsule Take 160 mg by mouth 3 (three) times daily.    . vitamin B-12 (CYANOCOBALAMIN) 100 MCG tablet Take 100 mcg by mouth daily.     No current facility-administered medications for this visit.     Social History   Social History  . Marital status: Married    Spouse name: N/A  . Number of children: N/A  . Years of education: N/A   Occupational History  . Not on file.   Social History Main Topics  . Smoking status: Current Every Day Smoker    Packs/day: 0.25    Years: 47.00    Types: Cigarettes    Start date: 02/20/1972  . Smokeless tobacco: Never Used  . Alcohol use 0.0 oz/week     Comment: Hx of EtOH abuse, quit in 1982  . Drug use:     Types: Marijuana     Comment: "smoked marijuana 40 years quit in ~ 2014, did a little of qthing in the 1970's for ~ 3 yrs"  . Sexual activity: Not Currently   Other Topics  Concern  . Not on file   Social History Narrative   Lives with his wife and 17 ye22 old son.  Normally ambulates without assistive device, drives, completes ADLs without difficulty.     Currently, he is not smoking tobacco cigarettes  but has been "vaping. "  Family History  Problem Relation Age  of Onset  . Alzheimer's disease Mother   . CAD Father     had 2 PCI after age 68  . Depression Father   . Anxiety disorder Mother   . High blood pressure Mother      ROS General: Negative; No fevers, chills, or night sweats HEENT: Negative; No changes in vision or hearing, sinus congestion, difficulty swallowing Pulmonary: Negative; No cough, wheezing, shortness of breath, hemoptysis Cardiovascular: Negative; No Recurrent chest pain, presyncope, syncope, palpatations GI: Negative; No nausea, vomiting, diarrhea, or abdominal pain GU: Negative; No dysuria, hematuria, or difficulty voiding Musculoskeletal: Negative; no myalgias, joint pain, or weakness Hematologic: Negative; no easy bruising, bleeding Endocrine: Negative; no heat/cold intolerance Neuro: Negative; no changes in balance, headaches Skin: Negative; No rashes or skin lesions Psychiatric: Negative; No behavioral problems, depression Sleep: Positive for MRSA, now on CPAP therapy.; No daytime sleepiness, hypersomnolence, bruxism, restless legs, hypnogognic hallucinations, no cataplexy   Physical Exam BP 120/72   Pulse 100   Ht '5\' 11"'  (1.803 m)   Wt 214 lb 12.8 oz (97.4 kg)   SpO2 96%   BMI 29.96 kg/m   Wt Readings from Last 3 Encounters:  10/15/16 214 lb 12.8 oz (97.4 kg)  06/28/16 208 lb (94.3 kg)  05/07/16 207 lb (93.9 kg)   General: Alert, oriented, no distress.  Skin: normal turgor, no rashes HEENT: Normocephalic, atraumatic. Pupils round and reactive; sclera anicteric; extraocular muscles intact; Fundi Without hemorrhages or exudates. Nose without nasal septal hypertrophy; when he breathes in, there is partial  closure of his left nostril.  compared to his right. Mouth/Parynx benign; Mallinpatti scale 3/4 Neck: No JVD, no carotid briuts Lungs: clear to ausculatation and percussion; no wheezing or rales  Chest wall: No tenderness to palpation Heart: RRR, s1 s2 normal; faint 1/6 systolic murmur.  No S3 or S4 gallop.  No rubs thrills or heaves. Abdomen: soft, nontender; no hepatosplenomehaly, BS+; abdominal aorta nontender and not dilated by palpation. Back: No CVA tenderness Pulses 2+ Extremities: no clubbinbg cyanosis or edema, Homan's sign negative  Neurologic: grossly nonfocal; cranial nerves intact. Psychological: Normal affect and mood.  ECG not done today, but his last ECG from 09/17/2015 was independently reviewed by me and shows sinus bradycardia 56 bpm with septal Q waves in V1 and V2.   LABS:  BMP Latest Ref Rng & Units 11/15/2015 09/23/2015 09/17/2015  Glucose 65 - 99 mg/dL 99 109(H) 113(H)  BUN 7 - 25 mg/dL '11 14 14  ' Creatinine 0.70 - 1.25 mg/dL 1.23 1.18 1.28(H)  Sodium 135 - 146 mmol/L 137 137 138  Potassium 3.5 - 5.3 mmol/L 3.6 4.0 4.1  Chloride 98 - 110 mmol/L 102 102 99(L)  CO2 20 - 31 mmol/L '26 26 29  ' Calcium 8.6 - 10.3 mg/dL 9.1 9.3 9.7     Hepatic Function Latest Ref Rng & Units 11/15/2015 02/20/2013 02/19/2013  Total Protein 6.1 - 8.1 g/dL 7.4 7.0 8.4(H)  Albumin 3.6 - 5.1 g/dL 4.4 3.4(L) 4.3  AST 10 - 35 U/L '17 16 17  ' ALT 9 - 46 U/L '17 18 25  ' Alk Phosphatase 40 - 115 U/L 58 60 74  Total Bilirubin 0.2 - 1.2 mg/dL 0.4 0.5 0.4  Bilirubin, Direct <=0.2 mg/dL 0.2 - -     CBC Latest Ref Rng & Units 11/15/2015 09/23/2015 09/17/2015  WBC 4.0 - 10.5 K/uL 8.6 9.5 8.4  Hemoglobin 13.0 - 17.0 g/dL 13.9 14.1 14.6  Hematocrit 39.0 - 52.0 % 40.5 41.8 42.8  Platelets  150 - 400 K/uL 225 241 194     Lipid Panel     Component Value Date/Time   CHOL 88 (L) 11/15/2015 0857   TRIG 54 11/15/2015 0857   HDL 41 11/15/2015 0857   CHOLHDL 2.1 11/15/2015 0857   VLDL 11  11/15/2015 0857   LDLCALC 36 11/15/2015 0857     RADIOLOGY: No results found.    ASSESSMENT AND PLAN: Joseph Fitzpatrick is a 65 year old white male who suffered a non-ST segment MI in October 2016 and was successfully treated with DES stenting to circumflex marginal vessel.  He has a history of hypertension, hyperlipidemia, as well as tobacco use.  I reviewed both his sleep study and CPAP titration study with him in detail today in the office.  He had mild sleep apnea with an HI of 12.8, but events were very severe in the supine position.  On his diagnostic study had significantly reduced sleep efficiency and prolonged latency to read sleep.  He has felt significantly improved since initiating CPAP therapy.  He denies breakthrough snoring.  There is no hypersomnolence.  He is meeting compliance standards.  However, I discussed with him the importance of sleeping for longer duration since he is only averaging 6 hours and 6 minutes per night.  With his significant positional component.  I am changing him from a 13 cm fixed pressure to a CPAP Auto mode with a range commencing at 8 cm and increasing up to 20 cm if necessary. This hopefully will further reduce his AHI which remain slightly elevated at 6.8 on his most recent download.  He will return to the cardiology care of Dr. Tamala Julian.  I will see him in one year for sleep reevaluation.  Time spent: 25 minutes  Troy Sine, MD, [facility]  10/17/2016 6:40 PM

## 2016-10-26 ENCOUNTER — Inpatient Hospital Stay: Admission: RE | Admit: 2016-10-26 | Payer: 59 | Source: Ambulatory Visit

## 2016-11-01 ENCOUNTER — Other Ambulatory Visit: Payer: Self-pay | Admitting: Nurse Practitioner

## 2016-11-01 NOTE — Telephone Encounter (Signed)
Pt overdue for appt, please call to see Dr. Tamala Julian

## 2016-11-07 ENCOUNTER — Encounter: Payer: Self-pay | Admitting: Cardiovascular Disease

## 2016-12-05 ENCOUNTER — Other Ambulatory Visit: Payer: Self-pay | Admitting: Nurse Practitioner

## 2017-02-17 ENCOUNTER — Other Ambulatory Visit: Payer: Self-pay | Admitting: Interventional Cardiology

## 2017-03-17 ENCOUNTER — Other Ambulatory Visit: Payer: Self-pay | Admitting: Interventional Cardiology

## 2017-03-18 ENCOUNTER — Other Ambulatory Visit: Payer: Self-pay | Admitting: *Deleted

## 2017-03-18 MED ORDER — ATORVASTATIN CALCIUM 80 MG PO TABS: 80.0000 mg | ORAL_TABLET | Freq: Every day | ORAL | 0 refills | Status: DC

## 2017-03-19 ENCOUNTER — Other Ambulatory Visit: Payer: Self-pay | Admitting: Interventional Cardiology

## 2017-03-19 NOTE — Telephone Encounter (Signed)
Medication Detail    Disp Refills Start End   atorvastatin (LIPITOR) 80 MG tablet 15 tablet 0 03/18/2017    Sig - Route: Take 1 tablet (80 mg total) by mouth daily at 6 PM. - Oral   Notes to Pharmacy: Please call our office to schedule an yearly appointment before anymore refills. [phone]. Thank you 2ND attempt   E-Prescribing Status: Receipt confirmed by pharmacy (03/18/2017 2:29 PM EDT)   Pharmacy   [id number] [address], [place]

## 2017-03-23 ENCOUNTER — Other Ambulatory Visit: Payer: Self-pay | Admitting: Interventional Cardiology

## 2017-03-25 ENCOUNTER — Other Ambulatory Visit: Payer: Self-pay | Admitting: Interventional Cardiology

## 2017-03-25 ENCOUNTER — Telehealth: Payer: Self-pay | Admitting: Interventional Cardiology

## 2017-03-25 ENCOUNTER — Telehealth: Payer: Self-pay

## 2017-03-25 MED ORDER — ATORVASTATIN CALCIUM 80 MG PO TABS: 80.0000 mg | ORAL_TABLET | Freq: Every day | ORAL | 0 refills | Status: DC

## 2017-03-25 NOTE — Telephone Encounter (Signed)
New Message     *STAT* If patient is at the pharmacy, call can be transferred to refill team.   1. Which medications need to be refilled? (please list name of each medication and dose if known) atorvastatin (LIPITOR) 80 MG tablet  2. Which pharmacy/location (including street and city if local pharmacy) is medication to be sent to? [id number] - [place], [place] [address]  3. Do they need a 30 day or 90 day supply? 30 day supply   Appt scheduled with Dr. Tamala Julian for 5/24

## 2017-03-25 NOTE — Telephone Encounter (Signed)
Pt's medication was sent to pt's pharmacy as requested. Confirmation received.  °

## 2017-03-25 NOTE — Telephone Encounter (Signed)
Patient was seen in March of 2017. Refill sent for 15 days. 2nd attempt. Patient knows to make office visit for further refills.    [facility] CMA AAMA

## 2017-03-29 ENCOUNTER — Other Ambulatory Visit: Payer: Self-pay | Admitting: Interventional Cardiology

## 2017-03-29 MED ORDER — ATORVASTATIN CALCIUM 80 MG PO TABS: 80.0000 mg | ORAL_TABLET | Freq: Every day | ORAL | 0 refills | Status: DC

## 2017-04-02 ENCOUNTER — Encounter: Payer: Self-pay | Admitting: Interventional Cardiology

## 2017-04-17 NOTE — Progress Notes (Signed)
Cardiology Office Note    Date:  04/18/2017   ID:  Joseph Fitzpatrick, DOB 1951-04-04, MRN [id number]  PCP:  Leighton Ruff, MD  Cardiologist: Sinclair Grooms, MD   Chief Complaint  Patient presents with  . Coronary Artery Disease    History of Present Illness:  LORENSO Fitzpatrick is a 66 y.o. male who presents for CAD with recent stent to the circumflex, non-ST elevation MI, essential hypertension, depression, tobacco abuse, and hyperlipidemia. The NSTEMI in 2016 with OM1 felt to be the culprit vessel and this was successfully treated with a 2.25 x 32 mm Promus Premier drug-eluting stent.  Fractional flow reserve was measured across the RCA lesion and was normal at 0.93.  Joseph Fitzpatrick is doing well. He has not had angina. He has an occasional left lateral chest pinpoint discomfort that can last seconds, occurs spontaneously, and has no associated complaints.  He has decreased cigarette smoking to 1 per day.  Past Medical History:  Diagnosis Date  . Anxiety   . Borderline diabetes    a. 08/2015 HbA1c = 5.9.  . BPH (benign prostatic hypertrophy)   . CAD (coronary artery disease)    a. 08/2015 NSTEMI/PCI: LM nl, LAD nl, RI nl, LCX 20p, OM1 95 (2.25x32 Pomus Premier DES), RCA 88m (FFR 0.93)->Med Rx, EF 55-65%.  . Depression   . Essential hypertension   . GERD (gastroesophageal reflux disease)   . Hematuria   . History of duodenal ulcer   . History of hiatal hernia    "suspected; never confirmed"  . History of nephrolithiasis   . History of pneumonia 1993 X 1  . History of substance abuse   . Hypercholesterolemia   . Hypokalemia   . IBS (irritable bowel syndrome)   . PVCs (premature ventricular contractions)   . Tobacco abuse     Past Surgical History:  Procedure Laterality Date  . CARDIAC CATHETERIZATION N/A 09/16/2015   Procedure: Left Heart Cath and Coronary Angiography;  Surgeon: Peter M Martinique, MD;  Location: [facility];  Service: Cardiovascular;  Laterality:  N/A;  . CARDIAC CATHETERIZATION  09/16/2015   Procedure: Intravascular Pressure Wire/FFR Study;  Surgeon: Peter M Martinique, MD;  Location: [facility];  Service: Cardiovascular;;  . CARDIAC CATHETERIZATION  09/16/2015   Procedure: Coronary Stent Intervention;  Surgeon: Peter M Martinique, MD;  Location: [facility];  Service: Cardiovascular;;  . CHOLECYSTECTOMY OPEN    . TONSILLECTOMY      Current Medications: Outpatient Medications Prior to Visit  Medication Sig Dispense Refill  . amLODipine (NORVASC) 5 MG tablet TAKE 1 TABLET BY MOUTH EVERY DAY 30 tablet 1  . aspirin EC 81 MG EC tablet Take 1 tablet (81 mg total) by mouth daily.    Marland Kitchen BRILINTA 90 MG TABS tablet TAKE 1 TABLET (90 MG TOTAL) BY MOUTH 2 (TWO) TIMES DAILY. 60 tablet 15  . clonazePAM (KLONOPIN) 1 MG tablet Take 1-2 mg by mouth 2 (two) times daily. Pt takes 1mg  in am and 2mg  in pm    . hydrOXYzine (VISTARIL) 25 MG capsule Take 25-75 mg by mouth 3 (three) times daily. Pt takes 25mg  in am, 50mg  in afternoon and 75mg  at bedtime    . KLOR-CON M10 10 MEQ tablet Take 10 mEq by mouth daily.  1  . Loratadine (CLARITIN) 10 MG CAPS Take 10 mg by mouth daily.    Marland Kitchen losartan-hydrochlorothiazide (HYZAAR) 50-12.5 MG per tablet Take 1 tablet by mouth daily.     Marland Kitchen  nitroGLYCERIN (NITROSTAT) 0.4 MG SL tablet Place 1 tablet (0.4 mg total) under the tongue every 5 (five) minutes x 3 doses as needed for chest pain. 25 tablet 3  . Omega-3 Fatty Acids (FISH OIL) 1000 MG CAPS Take 2 capsules by mouth daily.    Marland Kitchen PARoxetine (PAXIL) 40 MG tablet Take 40 mg by mouth every evening.  11  . saw palmetto 160 MG capsule Take 160 mg by mouth 3 (three) times daily.    . vitamin B-12 (CYANOCOBALAMIN) 100 MCG tablet Take 100 mcg by mouth daily.    Marland Kitchen atorvastatin (LIPITOR) 80 MG tablet Take 1 tablet (80 mg total) by mouth daily at 6 PM. 30 tablet 0  . amLODipine (NORVASC) 5 MG tablet TAKE 1 TABLET BY MOUTH EVERY DAY (Patient not taking: Reported on 04/18/2017)  90 tablet 2   No facility-administered medications prior to visit.      Allergies:   Patient has no known allergies.   Social History   Social History  . Marital status: Married    Spouse name: N/A  . Number of children: N/A  . Years of education: N/A   Social History Main Topics  . Smoking status: Current Every Day Smoker    Packs/day: 0.25    Years: 47.00    Types: Cigarettes    Start date: 02/20/1972  . Smokeless tobacco: Never Used  . Alcohol use 0.0 oz/week     Comment: Hx of EtOH abuse, quit in 1982  . Drug use: Yes    Types: Marijuana     Comment: "smoked marijuana 40 years quit in ~ 2014, did a little of qthing in the 1970's for ~ 3 yrs"  . Sexual activity: Not Currently   Other Topics Concern  . None   Social History Narrative   Lives with his wife and 61 year old son.  Normally ambulates without assistive device, drives, completes ADLs without difficulty.       Family History:  The patient's family history includes Alzheimer's disease in his mother; Anxiety disorder in his mother; CAD in his father; Depression in his father; High blood pressure in his mother.   ROS:   Please see the history of present illness.    Depression, diarrhea, shortness of breath, anxiety, and otherwise unremarkable. He is compliant with CP.  All other systems reviewed and are negative.   PHYSICAL EXAM:   VS:  BP 130/70   Pulse 75   Ht 5\' 11"  (1.803 m)   Wt 211 lb 12.8 oz (96.1 kg)   BMI 29.54 kg/m    GEN: Well nourished, well developed, in no acute distress  HEENT: normal  Neck: no JVD, carotid bruits, or masses Cardiac: RRR; no murmurs, rubs, or gallops,no edema  Respiratory:  clear to auscultation bilaterally, normal work of breathing GI: soft, nontender, nondistended, + BS MS: no deformity or atrophy  Skin: warm and dry, no rash Neuro:  Alert and Oriented x 3, Strength and sensation are intact Psych: euthymic mood, full affect  Wt Readings from Last 3 Encounters:    04/18/17 211 lb 12.8 oz (96.1 kg)  10/15/16 214 lb 12.8 oz (97.4 kg)  06/28/16 208 lb (94.3 kg)      Studies/Labs Reviewed:   EKG:  EKG  Normal sinus rhythm, heart is on all axis, nonspecific T wave flattening. No change compared to prior.  Recent Labs: No results found for requested labs within last 8760 hours.   Lipid Panel    Component Value Date/Time  CHOL 88 (L) 11/15/2015 0857   TRIG 54 11/15/2015 0857   HDL 41 11/15/2015 0857   CHOLHDL 2.1 11/15/2015 0857   VLDL 11 11/15/2015 0857   LDLCALC 36 11/15/2015 0857    Additional studies/ records that were reviewed today include:  No significant imaging of functional data. Laboratory data done at [facility] by Dr. Drema Dallas.    ASSESSMENT:    1. Coronary artery disease of native artery of native heart with stable angina pectoris ([place])   2. Essential hypertension   3. Tobacco abuse   4. Other hyperlipidemia      PLAN:  In order of problems listed above:  1. Encouraged aerobic activity. Notify us of chest discomfort with exertion. No functional testing needed at this time. 2. Low-salt diet. Target blood pressure less than 140/90 mmHg. 3. Encouraged him to discontinue even the one cigarette per day that he uses. 4. LDL target is less than 70. This is followed by Dr. Drema Dallas.  Blank look follow-up in one year. Encouraged aerobic activity. He should call of chest discomfort.    Medication Adjustments/Labs and Tests Ordered: Current medicines are reviewed at length with the patient today.  Concerns regarding medicines are outlined above.  Medication changes, Labs and Tests ordered today are listed in the Patient Instructions below. Patient Instructions  Medication Instructions:  None  Labwork: None  Testing/Procedures: None  Follow-Up: Your physician wants you to follow-up in: 1 year with Dr. Tamala Julian.  You will receive a reminder letter in the mail two months in advance. If you don't receive a letter, please call our  office to schedule the follow-up appointment.   Any Other Special Instructions Will Be Listed Below (If Applicable).     If you need a refill on your cardiac medications before your next appointment, please call your pharmacy.      Signed, Sinclair Grooms, MD  04/18/2017 5:09 PM    [facility] [place] [place], [place], [place]  [address] Phone: [phone]; Fax: [phone]

## 2017-04-18 ENCOUNTER — Ambulatory Visit (INDEPENDENT_AMBULATORY_CARE_PROVIDER_SITE_OTHER): Payer: 59 | Admitting: Interventional Cardiology

## 2017-04-18 ENCOUNTER — Encounter: Payer: Self-pay | Admitting: Interventional Cardiology

## 2017-04-18 VITALS — BP 130/70 | HR 75 | Ht 71.0 in | Wt 211.8 lb

## 2017-04-18 DIAGNOSIS — Z720 Tobacco use: Secondary | ICD-10-CM

## 2017-04-18 DIAGNOSIS — E784 Other hyperlipidemia: Secondary | ICD-10-CM | POA: Diagnosis not present

## 2017-04-18 DIAGNOSIS — I25118 Atherosclerotic heart disease of native coronary artery with other forms of angina pectoris: Secondary | ICD-10-CM

## 2017-04-18 DIAGNOSIS — E7849 Other hyperlipidemia: Secondary | ICD-10-CM

## 2017-04-18 DIAGNOSIS — I10 Essential (primary) hypertension: Secondary | ICD-10-CM

## 2017-04-18 MED ORDER — ATORVASTATIN CALCIUM 80 MG PO TABS: 80.0000 mg | ORAL_TABLET | Freq: Every day | ORAL | 3 refills | Status: DC

## 2017-04-18 NOTE — Patient Instructions (Signed)

## 2017-05-01 ENCOUNTER — Other Ambulatory Visit: Payer: Self-pay | Admitting: Family Medicine

## 2017-05-01 DIAGNOSIS — I739 Peripheral vascular disease, unspecified: Secondary | ICD-10-CM

## 2017-05-03 ENCOUNTER — Other Ambulatory Visit: Payer: 59

## 2017-05-08 ENCOUNTER — Ambulatory Visit
Admission: RE | Admit: 2017-05-08 | Discharge: 2017-05-08 | Disposition: A | Discharge status: Home / Self Care | Payer: 59 | Source: Ambulatory Visit | Attending: Family Medicine | Admitting: Family Medicine

## 2017-05-08 DIAGNOSIS — I739 Peripheral vascular disease, unspecified: Secondary | ICD-10-CM

## 2017-05-14 ENCOUNTER — Other Ambulatory Visit: Payer: Self-pay

## 2017-05-14 DIAGNOSIS — I739 Peripheral vascular disease, unspecified: Secondary | ICD-10-CM

## 2017-06-18 ENCOUNTER — Encounter: Payer: Self-pay | Admitting: Vascular Surgery

## 2017-07-03 ENCOUNTER — Ambulatory Visit (INDEPENDENT_AMBULATORY_CARE_PROVIDER_SITE_OTHER): Payer: 59 | Admitting: Vascular Surgery

## 2017-07-03 ENCOUNTER — Ambulatory Visit (HOSPITAL_COMMUNITY)
Admission: RE | Admit: 2017-07-03 | Discharge: 2017-07-03 | Disposition: A | Discharge status: Home / Self Care | Payer: 59 | Source: Ambulatory Visit | Attending: Vascular Surgery | Admitting: Vascular Surgery

## 2017-07-03 VITALS — BP 121/77 | HR 78 | Temp 98.4°F | Resp 20 | Ht 71.0 in | Wt 215.0 lb

## 2017-07-03 DIAGNOSIS — I70213 Atherosclerosis of native arteries of extremities with intermittent claudication, bilateral legs: Secondary | ICD-10-CM

## 2017-07-03 DIAGNOSIS — I739 Peripheral vascular disease, unspecified: Secondary | ICD-10-CM | POA: Diagnosis not present

## 2017-07-03 LAB — VAS US LOWER EXTREMITY ARTERIAL DUPLEX
LATIBDISTSYS: 80 cm/s
LEFT PERO DIST SYS: 44 cm/s
LPOPPPSV: 135 cm/s
LSFDPSV: -217 cm/s
LSFPPSV: -101 cm/s
Left popliteal dist sys PSV: -113 cm/s
Left super femoral mid sys PSV: -127 cm/s
RIGHT ANT DIST TIBAL SYS PSV: 55 cm/s
RSFDPSV: -78 cm/s
RTIBDISTSYS: 92 cm/s
Right peroneal sys PSV: 11 cm/s
Right popliteal dist sys PSV: -71 cm/s
Right popliteal prox sys PSV: 80 cm/s
Right super femoral mid sys PSV: -87 cm/s
Right super femoral prox sys PSV: -81 cm/s
left post tibial dist sys: 118 cm/s

## 2017-07-04 ENCOUNTER — Encounter: Payer: Self-pay | Admitting: Vascular Surgery

## 2017-07-04 NOTE — Progress Notes (Signed)
  [facility] [place]  Patient name: Joseph Fitzpatrick MRN: [id number] DOB: 10/03/1951 Sex: male  REASON FOR CONSULT: Bilateral lateral lower extremity claudication  HPI: Joseph Fitzpatrick is a 66 y.o. male, who is seen today for discussion of bilateral lower extremity claudication symptoms. He is an active 66-year-old gentleman. He had coronary artery disease with a relatively mild myocardial infarction 2 years ago and had stenting. He was active in cardiac rehabilitation but was unable to purchase pain in the walking exercise due to severe calf claudication. He is back off on his usual activity. He reports that this is becoming more limiting to him. If he does anything more than his usual walking routine around home he does have what he describes as a burning and tightness" in his calves bilaterally. This is relieved with rest but recurs quickly if he walks again. He has had no tissue loss and is not having arterial rest pain.  Past Medical History:  Diagnosis Date  . Anxiety   . Borderline diabetes    a. 08/2015 HbA1c = 5.9.  . BPH (benign prostatic hypertrophy)   . CAD (coronary artery disease)    a. 08/2015 NSTEMI/PCI: LM nl, LAD nl, RI nl, LCX 20p, OM1 95 (2.25x32 Pomus Premier DES), RCA 60m (FFR 0.93)->Med Rx, EF 55-65%.  . Depression   . Essential hypertension   . GERD (gastroesophageal reflux disease)   . Hematuria   . History of duodenal ulcer   . History of hiatal hernia    "suspected; never confirmed"  . History of nephrolithiasis   . History of pneumonia 1993 X 1  . History of substance abuse   . Hypercholesterolemia   . Hypokalemia   . IBS (irritable bowel syndrome)   . PVCs (premature ventricular contractions)   . Tobacco abuse     Family History  Problem Relation Age of Onset  . Alzheimer's disease Mother   . Anxiety disorder Mother   . High blood pressure Mother   . CAD Father        had 2 PCI after age  80  . Depression Father     SOCIAL HISTORY: Social History   Social History  . Marital status: Married    Spouse name: N/A  . Number of children: N/A  . Years of education: N/A   Occupational History  . Not on file.   Social History Main Topics  . Smoking status: Current Every Day Smoker    Packs/day: 0.25    Years: 47.00    Types: Cigarettes    Start date: 02/20/1972  . Smokeless tobacco: Never Used  . Alcohol use 0.0 oz/week     Comment: Hx of EtOH abuse, quit in 1982  . Drug use: Yes    Types: Marijuana     Comment: "smoked marijuana 40 years quit in ~ 2014, did a little of qthing in the 1970's for ~ 3 yrs"  . Sexual activity: Not Currently   Other Topics Concern  . Not on file   Social History Narrative   Lives with his wife and 17-year-old son.  Normally ambulates without assistive device, drives, completes ADLs without difficulty.      No Known Allergies  Current Outpatient Prescriptions  Medication Sig Dispense Refill  . amLODipine (NORVASC) 5 MG tablet TAKE 1 TABLET BY MOUTH EVERY DAY 30 tablet 1  . aspirin EC 81 MG EC tablet Take 1 tablet (81 mg total) by mouth daily.    .   atorvastatin (LIPITOR) 80 MG tablet Take 1 tablet (80 mg total) by mouth daily at 6 PM. 90 tablet 3  . BRILINTA 90 MG TABS tablet TAKE 1 TABLET (90 MG TOTAL) BY MOUTH 2 (TWO) TIMES DAILY. 60 tablet 15  . clonazePAM (KLONOPIN) 1 MG tablet Take 1-2 mg by mouth 2 (two) times daily. Pt takes 1mg in am and 2mg in pm    . hydrOXYzine (VISTARIL) 25 MG capsule Take 25-75 mg by mouth 3 (three) times daily. Pt takes 25mg in am, 50mg in afternoon and 75mg at bedtime    . KLOR-CON M10 10 MEQ tablet Take 10 mEq by mouth daily.  1  . Loratadine (CLARITIN) 10 MG CAPS Take 10 mg by mouth daily.    . losartan-hydrochlorothiazide (HYZAAR) 50-12.5 MG per tablet Take 1 tablet by mouth daily.     . nitroGLYCERIN (NITROSTAT) 0.4 MG SL tablet Place 1 tablet (0.4 mg total) under the tongue every 5 (five) minutes x  3 doses as needed for chest pain. 25 tablet 3  . Omega-3 Fatty Acids (FISH OIL) 1000 MG CAPS Take 2 capsules by mouth daily.    . PARoxetine (PAXIL) 40 MG tablet Take 40 mg by mouth every evening.  11  . saw palmetto 160 MG capsule Take 160 mg by mouth 3 (three) times daily.    . vitamin B-12 (CYANOCOBALAMIN) 100 MCG tablet Take 100 mcg by mouth daily.     No current facility-administered medications for this visit.     REVIEW OF SYSTEMS:  [X] denotes positive finding, [ ] denotes negative finding Cardiac  Comments:  Chest pain or chest pressure: x   Shortness of breath upon exertion: x   Short of breath when lying flat:    Irregular heart rhythm:        Vascular    Pain in calf, thigh, or hip brought on by ambulation: x   Pain in feet at night that wakes you up from your sleep:     Blood clot in your veins:    Leg swelling:  x       Pulmonary    Oxygen at home:    Productive cough:     Wheezing:         Neurologic    Sudden weakness in arms or legs:     Sudden numbness in arms or legs:     Sudden onset of difficulty speaking or slurred speech:    Temporary loss of vision in one eye:     Problems with dizziness:  x       Gastrointestinal    Blood in stool:     Vomited blood:         Genitourinary    Burning when urinating:     Blood in urine:        Psychiatric    Major depression:         Hematologic    Bleeding problems:    Problems with blood clotting too easily:        Skin    Rashes or ulcers:        Constitutional    Fever or chills:      PHYSICAL EXAM: Vitals:   07/04/17 1123  BP: 121/77  Pulse: 78  Resp: 20  Temp: 98.4 F (36.9 C)  TempSrc: Oral  SpO2: 95%  Weight: 215 lb (97.5 kg)  Height: 5' 11" (1.803 m)    GENERAL: The patient is a well-nourished male, in   no acute distress. The vital signs are documented above. CARDIOVASCULAR: Palpable radial 2+ bilaterally. 1+ femoral pulses bilaterally. 2+ dorsalis pedis pulses bilaterally. Carotid  arteries without bruits PULMONARY: There is good air exchange  ABDOMEN: Soft and non-tender  MUSCULOSKELETAL: There are no major deformities or cyanosis. NEUROLOGIC: No focal weakness or paresthesias are detected. SKIN: There are no ulcers or rashes noted. PSYCHIATRIC: The patient has a normal affect.  DATA:  I have his noninvasive studies from [facility]. This showed normal pressures in his lower extremities at rest. He underwent exercise noninvasive studies and this showed a marked drop in his pressures immediately with a 5 minute time for recovery.  He underwent duplex imaging in our office today and this suggests biphasic waveforms on the right and triphasic on the left with biphasic waveforms throughout the remaining portions with a diffuse stenotic disease.  MEDICAL ISSUES: Had long discussion with patient regarding this. He has a classic symptoms of claudication. He does have classic arterial intermittent claudication symptoms. His noninvasive studies suggest peripheral vascular disease but the no occlusion. I spent this is not limb threatening. Also explained that is a very high chance that he would have a lesion amenable to angioplasty for relief of symptoms. I explained that the aortoiliac disease has extremely good long-term durability but much less so in the superficial femoral distribution. Would not recommend intervention if he has superficial femoral disease. He understands this and will schedule outpatient aortogram with runoff and possible iliac intervention.   Todd F. Early, MD FACS [facility] Office Tel [phone] Pager [phone]    

## 2017-07-16 ENCOUNTER — Other Ambulatory Visit: Payer: Self-pay

## 2017-07-18 ENCOUNTER — Ambulatory Visit (HOSPITAL_COMMUNITY)
Admission: RE | Admit: 2017-07-18 | Discharge: 2017-07-18 | Disposition: A | Discharge status: Home / Self Care | Payer: 59 | Source: Ambulatory Visit | Attending: Vascular Surgery | Admitting: Vascular Surgery

## 2017-07-18 ENCOUNTER — Encounter (HOSPITAL_COMMUNITY)
Admission: RE | Disposition: A | Discharge status: Home / Self Care | Payer: Self-pay | Source: Ambulatory Visit | Attending: Vascular Surgery

## 2017-07-18 DIAGNOSIS — F17210 Nicotine dependence, cigarettes, uncomplicated: Secondary | ICD-10-CM | POA: Insufficient documentation

## 2017-07-18 DIAGNOSIS — E7800 Pure hypercholesterolemia, unspecified: Secondary | ICD-10-CM | POA: Insufficient documentation

## 2017-07-18 DIAGNOSIS — I70219 Atherosclerosis of native arteries of extremities with intermittent claudication, unspecified extremity: Secondary | ICD-10-CM

## 2017-07-18 DIAGNOSIS — Z7982 Long term (current) use of aspirin: Secondary | ICD-10-CM | POA: Insufficient documentation

## 2017-07-18 DIAGNOSIS — I10 Essential (primary) hypertension: Secondary | ICD-10-CM | POA: Diagnosis not present

## 2017-07-18 DIAGNOSIS — K219 Gastro-esophageal reflux disease without esophagitis: Secondary | ICD-10-CM | POA: Insufficient documentation

## 2017-07-18 DIAGNOSIS — I70213 Atherosclerosis of native arteries of extremities with intermittent claudication, bilateral legs: Secondary | ICD-10-CM | POA: Diagnosis not present

## 2017-07-18 DIAGNOSIS — F419 Anxiety disorder, unspecified: Secondary | ICD-10-CM | POA: Insufficient documentation

## 2017-07-18 DIAGNOSIS — N400 Enlarged prostate without lower urinary tract symptoms: Secondary | ICD-10-CM | POA: Insufficient documentation

## 2017-07-18 DIAGNOSIS — Z79899 Other long term (current) drug therapy: Secondary | ICD-10-CM | POA: Insufficient documentation

## 2017-07-18 DIAGNOSIS — F329 Major depressive disorder, single episode, unspecified: Secondary | ICD-10-CM | POA: Diagnosis not present

## 2017-07-18 DIAGNOSIS — I2510 Atherosclerotic heart disease of native coronary artery without angina pectoris: Secondary | ICD-10-CM | POA: Diagnosis not present

## 2017-07-18 DIAGNOSIS — Z7902 Long term (current) use of antithrombotics/antiplatelets: Secondary | ICD-10-CM | POA: Diagnosis not present

## 2017-07-18 DIAGNOSIS — E876 Hypokalemia: Secondary | ICD-10-CM | POA: Insufficient documentation

## 2017-07-18 HISTORY — PX: ABDOMINAL AORTOGRAM W/LOWER EXTREMITY: CATH118223

## 2017-07-18 LAB — POCT I-STAT, CHEM 8
BUN: 16 mg/dL (ref 6–20)
CALCIUM ION: 1.16 mmol/L (ref 1.15–1.40)
Chloride: 101 mmol/L (ref 101–111)
Creatinine, Ser: 1.1 mg/dL (ref 0.61–1.24)
Glucose, Bld: 94 mg/dL (ref 65–99)
HEMATOCRIT: 39 % (ref 39.0–52.0)
Hemoglobin: 13.3 g/dL (ref 13.0–17.0)
Potassium: 3.2 mmol/L — ABNORMAL LOW (ref 3.5–5.1)
SODIUM: 141 mmol/L (ref 135–145)
TCO2: 27 mmol/L (ref 0–100)

## 2017-07-18 SURGERY — ABDOMINAL AORTOGRAM W/LOWER EXTREMITY
Anesthesia: LOCAL

## 2017-07-18 MED ORDER — SODIUM CHLORIDE 0.9 % IV SOLN
INTRAVENOUS | Status: DC
  Administered 2017-07-18: 11:00:00 via INTRAVENOUS

## 2017-07-18 MED ORDER — LIDOCAINE HCL (PF) 1 % IJ SOLN
INTRAMUSCULAR | Status: AC
  Filled 2017-07-18: qty 30

## 2017-07-18 MED ORDER — FENTANYL CITRATE (PF) 100 MCG/2ML IJ SOLN
INTRAMUSCULAR | Status: AC
  Filled 2017-07-18: qty 2

## 2017-07-18 MED ORDER — FENTANYL CITRATE (PF) 100 MCG/2ML IJ SOLN
INTRAMUSCULAR | Status: DC | PRN
  Administered 2017-07-18 (×2): 25 ug via INTRAVENOUS

## 2017-07-18 MED ORDER — HEPARIN (PORCINE) IN NACL 2-0.9 UNIT/ML-% IJ SOLN
INTRAMUSCULAR | Status: AC
  Filled 2017-07-18: qty 1000

## 2017-07-18 MED ORDER — IODIXANOL 320 MG/ML IV SOLN
INTRAVENOUS | Status: DC | PRN
  Administered 2017-07-18: 120 mL via INTRA_ARTERIAL

## 2017-07-18 MED ORDER — MIDAZOLAM HCL 2 MG/2ML IJ SOLN
INTRAMUSCULAR | Status: DC | PRN
  Administered 2017-07-18 (×2): 0.5 mg via INTRAVENOUS

## 2017-07-18 MED ORDER — SODIUM CHLORIDE 0.9 % WEIGHT BASED INFUSION: 1.0000 mL/kg/h | INTRAVENOUS | Status: DC

## 2017-07-18 MED ORDER — SODIUM CHLORIDE 0.9 % IV SOLN: 250.0000 mL | INTRAVENOUS | Status: DC | PRN

## 2017-07-18 MED ORDER — SODIUM CHLORIDE 0.9% FLUSH: 3.0000 mL | Freq: Two times a day (BID) | INTRAVENOUS | Status: DC

## 2017-07-18 MED ORDER — HEPARIN (PORCINE) IN NACL 2-0.9 UNIT/ML-% IJ SOLN
INTRAMUSCULAR | Status: AC | PRN
  Administered 2017-07-18: 1000 mL

## 2017-07-18 MED ORDER — LIDOCAINE HCL (PF) 1 % IJ SOLN
INTRAMUSCULAR | Status: DC | PRN
  Administered 2017-07-18: 30 mL via INTRADERMAL

## 2017-07-18 MED ORDER — MIDAZOLAM HCL 2 MG/2ML IJ SOLN
INTRAMUSCULAR | Status: AC
  Filled 2017-07-18: qty 2

## 2017-07-18 MED ORDER — HYDRALAZINE HCL 20 MG/ML IJ SOLN: 5.0000 mg | INTRAMUSCULAR | Status: DC | PRN

## 2017-07-18 MED ORDER — LABETALOL HCL 5 MG/ML IV SOLN: 10.0000 mg | INTRAVENOUS | Status: DC | PRN

## 2017-07-18 MED ORDER — SODIUM CHLORIDE 0.9% FLUSH: 3.0000 mL | INTRAVENOUS | Status: DC | PRN

## 2017-07-18 SURGICAL SUPPLY — 10 items
CATH OMNI FLUSH 5F 65CM (CATHETERS) ×1 IMPLANT
CATH STRAIGHT 5FR 65CM (CATHETERS) ×1 IMPLANT
GUIDEWIRE ANGLED .035X150CM (WIRE) ×1 IMPLANT
KIT MICROINTRODUCER STIFF 5F (SHEATH) ×1 IMPLANT
KIT PV (KITS) ×2 IMPLANT
SHEATH PINNACLE 5F 10CM (SHEATH) ×1 IMPLANT
SYR MEDRAD MARK V 150ML (SYRINGE) ×2 IMPLANT
TRANSDUCER W/STOPCOCK (MISCELLANEOUS) ×2 IMPLANT
TRAY PV CATH (CUSTOM PROCEDURE TRAY) ×2 IMPLANT
WIRE BENTSON .035X145CM (WIRE) ×1 IMPLANT

## 2017-07-18 NOTE — Interval H&P Note (Signed)
   History and Physical Update  The patient was interviewed and re-examined.  The patient's previous History and Physical has been reviewed and is unchanged from Dr. Luther Parody consult.  There is no change in the plan of care: Aortogram, bilateral leg runoff, and possible pelvic intervention.     I discussed with the patient the nature of angiographic procedures, especially the limited patencies of any endovascular intervention.    The patient is aware of that the risks of an angiographic procedure include but are not limited to: bleeding, infection, access site complications, renal failure, embolization, rupture of vessel, dissection, arteriovenous fistula, possible need for emergent surgical intervention, possible need for surgical procedures to treat the patient's pathology, anaphylactic reaction to contrast, and stroke and death.    The patient is aware of the risks and agrees to proceed.   Adele Barthel, MD, FACS [facility] Office: [phone] Pager: [phone]  07/18/2017, 10:13 AM

## 2017-07-18 NOTE — Discharge Instructions (Signed)
Angiogram, Care After °This sheet gives you information about how to care for yourself after your procedure. Your health care provider may also give you more specific instructions. If you have problems or questions, contact your health care provider. °What can I expect after the procedure? °After the procedure, it is common to have bruising and tenderness at the catheter insertion area. °Follow these instructions at home: °Insertion site care  °· Follow instructions from your health care provider about how to take care of your insertion site. Make sure you: °¨ Wash your hands with soap and water before you change your bandage (dressing). If soap and water are not available, use hand sanitizer. °¨ Change your dressing as told by your health care provider. °¨ Leave stitches (sutures), skin glue, or adhesive strips in place. These skin closures may need to stay in place for 2 weeks or longer. If adhesive strip edges start to loosen and curl up, you may trim the loose edges. Do not remove adhesive strips completely unless your health care provider tells you to do that. °· Do not take baths, swim, or use a hot tub until your health care provider approves. °· You may shower 24-48 hours after the procedure or as told by your health care provider. °¨ Gently wash the site with plain soap and water. °¨ Pat the area dry with a clean towel. °¨ Do not rub the site. This may cause bleeding. °· Do not apply powder or lotion to the site. Keep the site clean and dry. °· Check your insertion site every day for signs of infection. Check for: °¨ Redness, swelling, or pain. °¨ Fluid or blood. °¨ Warmth. °¨ Pus or a bad smell. °Activity  °· Rest as told by your health care provider, usually for 1-2 days. °· Do not lift anything that is heavier than 10 lbs. (4.5 kg) or as told by your health care provider. °· Do not drive for 24 hours if you were given a medicine to help you relax (sedative). °· Do not drive or use heavy machinery while  taking prescription pain medicine. °General instructions  °· Return to your normal activities as told by your health care provider, usually in about a week. Ask your health care provider what activities are safe for you. °· If the catheter site starts bleeding, lie flat and put pressure on the site. If the bleeding does not stop, get help right away. This is a medical emergency. °· Drink enough fluid to keep your urine clear or pale yellow. This helps flush the contrast dye from your body. °· Take over-the-counter and prescription medicines only as told by your health care provider. °· Keep all follow-up visits as told by your health care provider. This is important. °Contact a health care provider if: °· You have a fever or chills. °· You have redness, swelling, or pain around your insertion site. °· You have fluid or blood coming from your insertion site. °· The insertion site feels warm to the touch. °· You have pus or a bad smell coming from your insertion site. °· You have bruising around the insertion site. °· You notice blood collecting in the tissue around the catheter site (hematoma). The hematoma may be painful to the touch. °Get help right away if: °· You have severe pain at the catheter insertion area. °· The catheter insertion area swells very fast. °· The catheter insertion area is bleeding, and the bleeding does not stop when you hold steady pressure on   the area. °· The area near or just beyond the catheter insertion site becomes pale, cool, tingly, or numb. °These symptoms may represent a serious problem that is an emergency. Do not wait to see if the symptoms will go away. Get medical help right away. Call your local emergency services (911 in the [place] Do not drive yourself to the hospital. °Summary °· After the procedure, it is common to have bruising and tenderness at the catheter insertion area. °· After the procedure, it is important to rest and drink plenty of fluids. °· Do not take baths,  swim, or use a hot tub until your health care provider says it is okay to do so. You may shower 24-48 hours after the procedure or as told by your health care provider. °· If the catheter site starts bleeding, lie flat and put pressure on the site. If the bleeding does not stop, get help right away. This is a medical emergency. °This information is not intended to replace advice given to you by your health care provider. Make sure you discuss any questions you have with your health care provider. °Document Released: 05/31/2005 Document Revised: 10/17/2016 Document Reviewed: 10/17/2016 °[employer]vier Interactive Patient Education © 2017 [employer] ° °

## 2017-07-18 NOTE — Op Note (Signed)
OPERATIVE NOTE   PROCEDURE: 1.  right common femoral artery cannulation under ultrasound guidance 2.  Placement of catheter in aorta 3.  Aortogram 4.  Conscious sedation for 42 min 5.  Second order arterial selection 6.  left leg runoff via catheter 7.  right leg runoff via sheath  PRE-OPERATIVE DIAGNOSIS: bilateral leg intermittent claudication   POST-OPERATIVE DIAGNOSIS: same as above   SURGEON: Adele Barthel, MD  ANESTHESIA: conscious sedation  ESTIMATED BLOOD LOSS: 50 cc  CONTRAST: 120 cc  FINDING(S):  Aorta: patent  Superior mesenteric artery: patent Celiac artery: patent   Right Left  RA patent patent  CIA Patent, <30% stenosis (no gradient), calcified Patent, <30% stenosis, calcified  EIA patent patent  IIA patent patent  CFA patent patent  SFA Patent, distal 50% stenosis by measurements Patent  PFA Patent Patent  Pop Patent Patent  Trif Patent Patent  AT Patent Patent  Pero Patent Patent  PT Patent Patent   SPECIMEN(S):  none  INDICATIONS:   Joseph Fitzpatrick is a 66 y.o. male who presents with bilateral leg intermittent claudication.  Based on non-invasive studies, Dr. 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  The patient presents for: aortogram, bilateral leg runoff, and possible iliac intervention.  I discussed with the patient the nature of angiographic procedures, especially the limited patencies of any endovascular intervention.  I discussed with the patient the nature of angiographic procedures, especially the limited patencies of any endovascular intervention.  The patient is aware of that the risks of an angiographic procedure include but are not limited to: bleeding, infection, access site complications, renal failure, embolization, rupture of vessel, dissection, arteriovenous fistula, possible need for emergent surgical intervention, possible need for surgical  procedures to treat the patient's pathology, anaphylactic reaction to contrast, and stroke and death.  The patient is aware of the risks and agrees to proceed.    DESCRIPTION: After full informed consent was obtained from the patient, the patient was brought back to the angiography suite.  The patient was placed supine upon the angiography table and connected to cardiopulmonary monitoring equipment.  The patient was then given conscious sedation, the amounts of which are documented in the patient's chart.  The patient was prepped and drape in the standard fashion for an angiographic procedure.  At this point, attention was turned to the right groin.  Under ultrasound guidance, the subcutaneous tissue surrounding the right common femoral artery was anesthesized with 1% lidocaine with epinephrine.  The artery was then cannulated with a micropuncture needle.  The microwire was advanced into the iliac arterial system.  The needle was exchanged for a microsheath, which was loaded into the common femoral artery over the wire.  The microwire was exchanged for a Bentson wire which was advanced into the aorta.  The microsheath was then exchanged for a 5-Fr sheath which was loaded into the common femoral artery.  The Omniflush catheter was then loaded over the wire up to the level of L1.  The catheter was connected to the power injector circuit.  After de-airring and de-clotting the circuit, a power injector aortogram was completed.  The findings are listed above.  The Bentson wire was replaced in the catheter, and using the [facility] and Omniflush catheter, the left common iliac artery was selected.  The catheter and wire were advanced into what I thought was the external iliac artery.  A brief injection verified cannulation of internal iliac  artery.  I pulled the catheter back into the common iliac artery and exchanged the wire for the glidewire.  I used this to get into the left common femoral artery.  The catheter  would not advance past the left common iliac artery, so I exchanged it for an end-hole catheter.  This was advanced into the left external iliac artery.  An automated left leg runoff was completed after de-airring and de-clotting the circuit.  The findings are as listed above.    I pulled the catheter into the distal aorta.  I did a pull back gradient to measure the gradient across the right common iliac artery stenosis.  No gradient was measured across this <30% stenosis.  I pulled out the catheter.  The right sheath was aspirated and no clot was present.  The sheath was connected to the power injector circuit.  An automated right leg runoff was completed after de-airring and de-clotting the circuit.  The findings are as listed above.  The sheath was aspirated.  No clots were present and the sheath was reloaded with heparinized saline.    Based on the images, this patient needs: no immediate intervention..   COMPLICATIONS: none  CONDITION: stable   Adele Barthel, MD, [facility] [place] Office: [phone] Pager: [phone]  07/18/2017, 3:19 PM

## 2017-07-18 NOTE — H&P (View-Only) (Signed)
[facility] [place]  Patient name: Joseph Fitzpatrick MRN: [id number] DOB: 07/07/1951 Sex: male  REASON FOR CONSULT: Bilateral lateral lower extremity claudication  HPI: Joseph Fitzpatrick is a 66 y.o. male, who is seen today for discussion of bilateral lower extremity claudication symptoms. He is an active 66 year old gentleman. He had coronary artery disease with a relatively mild myocardial infarction 2 years ago and had stenting. He was active in cardiac rehabilitation but was unable to purchase pain in the walking exercise due to severe calf claudication. He is back off on his usual activity. He reports that this is becoming more limiting to him. If he does anything more than his usual walking routine around home he does have what he describes as a burning and tightness" in his calves bilaterally. This is relieved with rest but recurs quickly if he walks again. He has had no tissue loss and is not having arterial rest pain.  Past Medical History:  Diagnosis Date  . Anxiety   . Borderline diabetes    a. 08/2015 HbA1c = 5.9.  . BPH (benign prostatic hypertrophy)   . CAD (coronary artery disease)    a. 08/2015 NSTEMI/PCI: LM nl, LAD nl, RI nl, LCX 20p, OM1 95 (2.25x32 Pomus Premier DES), RCA 47m (FFR 0.93)->Med Rx, EF 55-65%.  . Depression   . Essential hypertension   . GERD (gastroesophageal reflux disease)   . Hematuria   . History of duodenal ulcer   . History of hiatal hernia    "suspected; never confirmed"  . History of nephrolithiasis   . History of pneumonia 1993 X 1  . History of substance abuse   . Hypercholesterolemia   . Hypokalemia   . IBS (irritable bowel syndrome)   . PVCs (premature ventricular contractions)   . Tobacco abuse     Family History  Problem Relation Age of Onset  . Alzheimer's disease Mother   . Anxiety disorder Mother   . High blood pressure Mother   . CAD Father        had 2 PCI after age  20  . Depression Father     SOCIAL HISTORY: Social History   Social History  . Marital status: Married    Spouse name: N/A  . Number of children: N/A  . Years of education: N/A   Occupational History  . Not on file.   Social History Main Topics  . Smoking status: Current Every Day Smoker    Packs/day: 0.25    Years: 47.00    Types: Cigarettes    Start date: 02/20/1972  . Smokeless tobacco: Never Used  . Alcohol use 0.0 oz/week     Comment: Hx of EtOH abuse, quit in 1982  . Drug use: Yes    Types: Marijuana     Comment: "smoked marijuana 40 years quit in ~ 2014, did a little of qthing in the 1970's for ~ 3 yrs"  . Sexual activity: Not Currently   Other Topics Concern  . Not on file   Social History Narrative   Lives with his wife and 49 year old son.  Normally ambulates without assistive device, drives, completes ADLs without difficulty.      No Known Allergies  Current Outpatient Prescriptions  Medication Sig Dispense Refill  . amLODipine (NORVASC) 5 MG tablet TAKE 1 TABLET BY MOUTH EVERY DAY 30 tablet 1  . aspirin EC 81 MG EC tablet Take 1 tablet (81 mg total) by mouth daily.    Marland Kitchen  atorvastatin (LIPITOR) 80 MG tablet Take 1 tablet (80 mg total) by mouth daily at 6 PM. 90 tablet 3  . BRILINTA 90 MG TABS tablet TAKE 1 TABLET (90 MG TOTAL) BY MOUTH 2 (TWO) TIMES DAILY. 60 tablet 15  . clonazePAM (KLONOPIN) 1 MG tablet Take 1-2 mg by mouth 2 (two) times daily. Pt takes 1mg  in am and 2mg  in pm    . hydrOXYzine (VISTARIL) 25 MG capsule Take 25-75 mg by mouth 3 (three) times daily. Pt takes 25mg  in am, 50mg  in afternoon and 75mg  at bedtime    . KLOR-CON M10 10 MEQ tablet Take 10 mEq by mouth daily.  1  . Loratadine (CLARITIN) 10 MG CAPS Take 10 mg by mouth daily.    Marland Kitchen losartan-hydrochlorothiazide (HYZAAR) 50-12.5 MG per tablet Take 1 tablet by mouth daily.     . nitroGLYCERIN (NITROSTAT) 0.4 MG SL tablet Place 1 tablet (0.4 mg total) under the tongue every 5 (five) minutes x  3 doses as needed for chest pain. 25 tablet 3  . Omega-3 Fatty Acids (FISH OIL) 1000 MG CAPS Take 2 capsules by mouth daily.    Marland Kitchen PARoxetine (PAXIL) 40 MG tablet Take 40 mg by mouth every evening.  11  . saw palmetto 160 MG capsule Take 160 mg by mouth 3 (three) times daily.    . vitamin B-12 (CYANOCOBALAMIN) 100 MCG tablet Take 100 mcg by mouth daily.     No current facility-administered medications for this visit.     REVIEW OF SYSTEMS:  [X]  denotes positive finding, [ ]  denotes negative finding Cardiac  Comments:  Chest pain or chest pressure: x   Shortness of breath upon exertion: x   Short of breath when lying flat:    Irregular heart rhythm:        Vascular    Pain in calf, thigh, or hip brought on by ambulation: x   Pain in feet at night that wakes you up from your sleep:     Blood clot in your veins:    Leg swelling:  x       Pulmonary    Oxygen at home:    Productive cough:     Wheezing:         Neurologic    Sudden weakness in arms or legs:     Sudden numbness in arms or legs:     Sudden onset of difficulty speaking or slurred speech:    Temporary loss of vision in one eye:     Problems with dizziness:  x       Gastrointestinal    Blood in stool:     Vomited blood:         Genitourinary    Burning when urinating:     Blood in urine:        Psychiatric    Major depression:         Hematologic    Bleeding problems:    Problems with blood clotting too easily:        Skin    Rashes or ulcers:        Constitutional    Fever or chills:      PHYSICAL EXAM: Vitals:   07/04/17 1123  BP: 121/77  Pulse: 78  Resp: 20  Temp: 98.4 F (36.9 C)  TempSrc: Oral  SpO2: 95%  Weight: 215 lb (97.5 kg)  Height: 5\' 11"  (1.803 m)    GENERAL: The patient is a well-nourished male, in  no acute distress. The vital signs are documented above. CARDIOVASCULAR: Palpable radial 2+ bilaterally. 1+ femoral pulses bilaterally. 2+ dorsalis pedis pulses bilaterally. Carotid  arteries without bruits PULMONARY: There is good air exchange  ABDOMEN: Soft and non-tender  MUSCULOSKELETAL: There are no major deformities or cyanosis. NEUROLOGIC: No focal weakness or paresthesias are detected. SKIN: There are no ulcers or rashes noted. PSYCHIATRIC: The patient has a normal affect.  DATA:  I have his noninvasive studies from [place]. This showed normal pressures in his lower extremities at rest. He underwent exercise noninvasive studies and this showed a marked drop in his pressures immediately with a 5 minute time for recovery.  He underwent duplex imaging in our office today and this suggests biphasic waveforms on the right and triphasic on the left with biphasic waveforms throughout the remaining portions with a diffuse stenotic disease.  MEDICAL ISSUES: Had long discussion with patient regarding this. He has a classic symptoms of claudication. He does have classic arterial intermittent claudication symptoms. His noninvasive studies suggest peripheral vascular disease but the no occlusion. I spent this is not limb threatening. Also explained that is a very high chance that he would have a lesion amenable to angioplasty for relief of symptoms. I explained that the aortoiliac disease has extremely good long-term durability but much less so in the superficial femoral distribution. Would not recommend intervention if he has superficial femoral disease. He understands this and will schedule outpatient aortogram with runoff and possible iliac intervention.   Rosetta Posner, MD FACS [facility] Tel [phone] Pager [phone]

## 2017-07-18 NOTE — Progress Notes (Signed)
   Daily Progress Note   This pt was left on Brilinta for his cardiac DES.  I discussed with this pt that this would put him at higher risk for a bleeding complication.  We discussed considering placement of a closure device at the end of the case to facilitate control of any bleeding.     Adele Barthel, MD, FACS [facility] [place] Office: [phone] Pager: [phone]  07/18/2017, 10:40 AM

## 2017-07-19 ENCOUNTER — Encounter (HOSPITAL_COMMUNITY): Payer: Self-pay | Admitting: Vascular Surgery

## 2017-08-09 ENCOUNTER — Other Ambulatory Visit: Payer: Self-pay | Admitting: Interventional Cardiology

## 2017-09-06 ENCOUNTER — Telehealth: Payer: Self-pay | Admitting: *Deleted

## 2017-09-06 NOTE — Telephone Encounter (Signed)
CPAP supply order signed by Dr. Kelly and returned to [facility] 

## 2017-10-23 ENCOUNTER — Ambulatory Visit: Payer: Medicare Other | Admitting: Cardiovascular Disease

## 2017-11-14 ENCOUNTER — Other Ambulatory Visit: Payer: Self-pay | Admitting: Nurse Practitioner

## 2017-12-25 ENCOUNTER — Ambulatory Visit: Payer: Medicare Other | Admitting: Cardiovascular Disease

## 2018-03-18 ENCOUNTER — Other Ambulatory Visit: Payer: Self-pay | Admitting: Nurse Practitioner

## 2018-03-25 ENCOUNTER — Ambulatory Visit: Payer: Self-pay | Admitting: Cardiovascular Disease

## 2018-03-25 DIAGNOSIS — R0989 Other specified symptoms and signs involving the circulatory and respiratory systems: Secondary | ICD-10-CM

## 2018-03-26 ENCOUNTER — Encounter: Payer: Self-pay | Admitting: *Deleted

## 2018-05-01 ENCOUNTER — Other Ambulatory Visit: Payer: Self-pay | Admitting: Interventional Cardiology

## 2018-05-10 ENCOUNTER — Other Ambulatory Visit: Payer: Self-pay | Admitting: Interventional Cardiology

## 2018-06-02 ENCOUNTER — Other Ambulatory Visit: Payer: Self-pay | Admitting: Interventional Cardiology

## 2018-06-12 ENCOUNTER — Other Ambulatory Visit: Payer: Self-pay | Admitting: Interventional Cardiology

## 2018-06-20 ENCOUNTER — Other Ambulatory Visit: Payer: Self-pay | Admitting: Interventional Cardiology

## 2018-06-20 MED ORDER — TICAGRELOR 90 MG PO TABS: 90.0000 mg | ORAL_TABLET | Freq: Two times a day (BID) | ORAL | 0 refills | Status: DC

## 2018-07-09 ENCOUNTER — Other Ambulatory Visit: Payer: Self-pay | Admitting: Interventional Cardiology

## 2018-07-12 ENCOUNTER — Other Ambulatory Visit: Payer: Self-pay | Admitting: Interventional Cardiology

## 2018-07-14 NOTE — Telephone Encounter (Signed)
Ok to give one more round of 15 tablets with note to make an appt.  After that further refills need to come from PCP if no cardiology appt.

## 2018-07-14 NOTE — Telephone Encounter (Signed)
Pt overdue for an appt and has had multiple refills with a notes to call in and schedule but pt has fail to do so. Please advice

## 2018-07-18 ENCOUNTER — Other Ambulatory Visit: Payer: Self-pay | Admitting: *Deleted

## 2018-07-18 ENCOUNTER — Telehealth: Payer: Self-pay | Admitting: Interventional Cardiology

## 2018-07-18 MED ORDER — TICAGRELOR 90 MG PO TABS: 90.0000 mg | ORAL_TABLET | Freq: Two times a day (BID) | ORAL | 0 refills | Status: DC

## 2018-07-18 NOTE — Telephone Encounter (Signed)
° ° ° °*  STAT* If patient is at the pharmacy, call can be transferred to refill team.   1. Which medications need to be refilled? (please list name of each medication and dose if known) ticagrelor (BRILINTA) 90 MG TABS tablet  2. Which pharmacy/location (including street and city if local pharmacy) is medication to be sent to?[id number] - [place], [place] - [address]  3. Do they need a 30 day or 90 day supply? [place]

## 2018-08-01 ENCOUNTER — Other Ambulatory Visit: Payer: Self-pay | Admitting: Interventional Cardiology

## 2018-08-29 ENCOUNTER — Other Ambulatory Visit: Payer: Self-pay | Admitting: Interventional Cardiology

## 2018-09-23 ENCOUNTER — Ambulatory Visit: Payer: 59 | Admitting: Interventional Cardiology

## 2018-09-29 ENCOUNTER — Ambulatory Visit: Payer: 59 | Admitting: Nurse Practitioner

## 2018-09-29 ENCOUNTER — Telehealth (HOSPITAL_COMMUNITY): Payer: Self-pay | Admitting: *Deleted

## 2018-09-29 ENCOUNTER — Encounter: Payer: Self-pay | Admitting: Nurse Practitioner

## 2018-09-29 VITALS — BP 158/90 | HR 75 | Ht 70.0 in | Wt 209.8 lb

## 2018-09-29 DIAGNOSIS — E7849 Other hyperlipidemia: Secondary | ICD-10-CM

## 2018-09-29 DIAGNOSIS — I25118 Atherosclerotic heart disease of native coronary artery with other forms of angina pectoris: Secondary | ICD-10-CM | POA: Diagnosis not present

## 2018-09-29 DIAGNOSIS — R0602 Shortness of breath: Secondary | ICD-10-CM

## 2018-09-29 DIAGNOSIS — Z720 Tobacco use: Secondary | ICD-10-CM | POA: Diagnosis not present

## 2018-09-29 DIAGNOSIS — R39198 Other difficulties with micturition: Secondary | ICD-10-CM

## 2018-09-29 DIAGNOSIS — I2510 Atherosclerotic heart disease of native coronary artery without angina pectoris: Secondary | ICD-10-CM | POA: Diagnosis not present

## 2018-09-29 LAB — CBC
Hematocrit: 42 % (ref 37.5–51.0)
Hemoglobin: 13.4 g/dL (ref 13.0–17.7)
MCH: 29.4 pg (ref 26.6–33.0)
MCHC: 31.9 g/dL (ref 31.5–35.7)
MCV: 92 fL (ref 79–97)
Platelets: 234 10*3/uL (ref 150–450)
RBC: 4.56 x10E6/uL (ref 4.14–5.80)
RDW: 14 % (ref 12.3–15.4)
WBC: 7.5 10*3/uL (ref 3.4–10.8)

## 2018-09-29 LAB — BASIC METABOLIC PANEL
BUN/Creatinine Ratio: 11 (ref 10–24)
BUN: 13 mg/dL (ref 8–27)
CO2: 23 mmol/L (ref 20–29)
Calcium: 9.3 mg/dL (ref 8.6–10.2)
Chloride: 100 mmol/L (ref 96–106)
Creatinine, Ser: 1.14 mg/dL (ref 0.76–1.27)
GFR calc Af Amer: 77 mL/min/{1.73_m2} (ref >59)
GFR calc non Af Amer: 67 mL/min/{1.73_m2} (ref >59)
Glucose: 108 mg/dL — ABNORMAL HIGH (ref 65–99)
Potassium: 3.4 mmol/L — ABNORMAL LOW (ref 3.5–5.2)
Sodium: 140 mmol/L (ref 134–144)

## 2018-09-29 LAB — PSA: Prostate Specific Ag, Serum: 0.3 ng/mL (ref 0.0–4.0)

## 2018-09-29 MED ORDER — ATORVASTATIN CALCIUM 80 MG PO TABS: ORAL_TABLET | ORAL | 3 refills | Status: DC

## 2018-09-29 MED ORDER — NITROGLYCERIN 0.4 MG SL SUBL: 0.4000 mg | SUBLINGUAL_TABLET | SUBLINGUAL | 3 refills | Status: AC | PRN

## 2018-09-29 MED ORDER — AMLODIPINE BESYLATE 5 MG PO TABS: 5.0000 mg | ORAL_TABLET | Freq: Every day | ORAL | 3 refills | Status: DC

## 2018-09-29 NOTE — Patient Instructions (Addendum)
We will be checking the following labs today - BMET, CBC, PSA  If you have labs (blood work) drawn today and your tests are completely normal, you will receive your results only by: Marland Kitchen MyChart Message (if you have MyChart) OR . A paper copy in the mail If you have any lab test that is abnormal or we need to change your treatment, we will call you to review the results.   Medication Instructions:    Continue with your current medicines. BUT  STOP Brilinta  I have refilled your medicines today - restart the Norvasc please   If you need a refill on your cardiac medications before your next appointment, please call your pharmacy.     Testing/Procedures To Be Arranged:  Lexiscan Myoview  You are scheduled for a Myocardial Perfusion Imaging Study on _______________________________ at _______________________________________________-.   Please arrive 15 minutes prior to your appointment time for registration and insurance purposes.   The test will take approximately 3 to 4 hours to complete; you may bring reading material. If someone comes with you to your appointment, they will need to remain in the main lobby due to limited space in the testing area.    How to prepare for your Myocardial Perfusion test:   Do not eat or drink 3 hours prior to your test, except you may have water.    Do not consume products containing caffeine (regular or decaffeinated) 12 hours prior to your test (ex: coffee, chocolate, soda, tea)   Do bring a list of your current medications with you. If not listed below, you may take your medications as normal.   Bring any held medication to your appointment, as you may be required to take it once the test is complete.   Do wear comfortable clothes (no dresses or overalls) and walking shoes. Tennis shoes are preferred. No heels or open toed shoes.  Do not wear cologne, perfume, aftershave or lotions (deodorant is allowed).   If these instructions are not  followed, you test will have to be rescheduled.   Please report to [address] for your test. If you have questions or concerns about your appointment, please call the Nuclear Lab at [phone].  If you cannot keep your appointment, please provide 24 hour notification to the Nuclear lab to avoid a possible $50 charge to your account.       Follow-Up:   See Dr. Tamala Julian in one year.     At [facility], you and your health needs are our priority.  As part of our continuing mission to provide you with exceptional heart care, we have created designated Provider Care Teams.  These Care Teams include your primary Cardiologist (physician) and Advanced Practice Providers (APPs -  Physician Assistants and Nurse Practitioners) who all work together to provide you with the care you need, when you need it.  Special Instructions:  . None  Call the [place] office at [phone] if you have any questions, problems or concerns.

## 2018-09-29 NOTE — Telephone Encounter (Signed)
Patient given detailed instructions per Myocardial Perfusion Study Information Sheet for the test on 10/01/18 at 0945. Patient notified to arrive 15 minutes early and that it is imperative to arrive on time for appointment to keep from having the test rescheduled.  If you need to cancel or reschedule your appointment, please call the office within 24 hours of your appointment. . Patient verbalized understanding.Marla Pouliot, [place]

## 2018-09-29 NOTE — Progress Notes (Addendum)
CARDIOLOGY OFFICE NOTE   Date:  09/29/2018    Joseph Fitzpatrick Date of Birth: 1951/10/12 Medical Record #[id number]  PCP:  Leighton Ruff, MD  Cardiologist:  Jennings Books    Chief Complaint  Patient presents with  . Coronary Artery Disease    1 1/2 year check - seen for Dr. Tamala Julian    History of Present Illness: Joseph Fitzpatrick is a 67 y.o. male who presents today for a 1 1/2 year check. Seen for Dr. Tamala Julian.   He has known CAD with prior stent to the LCX, prior NSTEMI in 2016 with OM1 felt to be the culprit vessel and this was successfully treated with a 2.25 x 32 mm Promus Premier drug-eluting stent.Fractional flow reserve was measured across the RCA lesion and was normal at 0.93. His other issues include HTN, depression, tobacco abuse and HLD. He did have an abdominal aortogram in 06/2017 for claudication - found to have minimal disease in the aortoiliac arterial system and 50% stenosis in distal superfical femoral artery.   He was last seen in May of 2018 - smoking 1 cig per day - otherwise, felt to be doing ok.   Comes in today. Here alone. He has been able to stop smoking completely but tells me he is vaping instead - continues to smoke cigars too (just doesn't inhale). He has been out of his Norvasc for several months - could not get refilled. BP is up. He is taking his Brilinta. He feels like his shortness of breath is progressive and worse with activity. He continues to have a chest discomfort - in the left upper chest - come and goes - not exertional - says he has had this for years - even prior to the stent - this has not changed. Asking about generic Lipitor. Needs NTG refilled - does not sound like he is taking. No bleeding. He is upset that his PCP did not do a PSA check on him. He has had recent URI - slowly resolving.   Past Medical History:  Diagnosis Date  . Anxiety   . Borderline diabetes    a. 08/2015 HbA1c = 5.9.  . BPH (benign prostatic hypertrophy)     . CAD (coronary artery disease)    a. 08/2015 NSTEMI/PCI: LM nl, LAD nl, RI nl, LCX 20p, OM1 95 (2.25x32 Pomus Premier DES), RCA 7m (FFR 0.93)->Med Rx, EF 55-65%.  . Depression   . Essential hypertension   . GERD (gastroesophageal reflux disease)   . Hematuria   . History of duodenal ulcer   . History of hiatal hernia    "suspected; never confirmed"  . History of nephrolithiasis   . History of pneumonia 1993 X 1  . History of substance abuse ([place])   . Hypercholesterolemia   . Hypokalemia   . IBS (irritable bowel syndrome)   . PVCs (premature ventricular contractions)   . Tobacco abuse     Past Surgical History:  Procedure Laterality Date  . ABDOMINAL AORTOGRAM W/LOWER EXTREMITY N/A 07/18/2017   Procedure: ABDOMINAL AORTOGRAM W/LOWER EXTREMITY;  Surgeon: Conrad [place]ville, MD;  Location: [facility];  Service: Cardiovascular;  Laterality: N/A;  . CARDIAC CATHETERIZATION N/A 09/16/2015   Procedure: Left Heart Cath and Coronary Angiography;  Surgeon: Peter M Martinique, MD;  Location: [facility];  Service: Cardiovascular;  Laterality: N/A;  . CARDIAC CATHETERIZATION  09/16/2015   Procedure: Intravascular Pressure Wire/FFR Study;  Surgeon: Peter M Martinique, MD;  Location:  [facility];  Service: Cardiovascular;;  . CARDIAC CATHETERIZATION  09/16/2015   Procedure: Coronary Stent Intervention;  Surgeon: Peter M Martinique, MD;  Location: [facility];  Service: Cardiovascular;;  . CHOLECYSTECTOMY OPEN    . TONSILLECTOMY       Medications: Current Meds  Medication Sig  . amLODipine (NORVASC) 5 MG tablet Take 1 tablet (5 mg total) by mouth daily.  Marland Kitchen aspirin EC 81 MG EC tablet Take 1 tablet (81 mg total) by mouth daily.  Marland Kitchen atorvastatin (LIPITOR) 80 MG tablet TAKE 1 TABLET BY MOUTH EVERY DAY AT 6PM.  . clonazePAM (KLONOPIN) 1 MG tablet Take 1 mg by mouth 3 (three) times daily as needed for anxiety.  . hydrOXYzine (VISTARIL) 25 MG capsule Take 25-75 mg by mouth 3 (three)  times daily. Take 1 capsule (25mg ) at 0800,2 capsules (50mg ) at 1400, and 3 capsules (75 mg) at 2000.  Marland Kitchen KLOR-CON M10 10 MEQ tablet Take 10 mEq by mouth daily. (0800)  . Loratadine (CLARITIN) 10 MG CAPS Take 10 mg by mouth daily as needed (for allergies.).   Marland Kitchen losartan-hydrochlorothiazide (HYZAAR) 50-12.5 MG per tablet Take 1 tablet by mouth daily. (0800)  . Menthol-Zinc Oxide (GOLD BOND EX) Apply 1 application topically daily.  . nitroGLYCERIN (NITROSTAT) 0.4 MG SL tablet Place 1 tablet (0.4 mg total) under the tongue every 5 (five) minutes x 3 doses as needed for chest pain.  . Omega-3 Fatty Acids (FISH OIL) 1200 MG CAPS Take 1,200 mg by mouth 2 (two) times daily. (0800 & 2000)  . PARoxetine (PAXIL) 40 MG tablet Take 40 mg by mouth daily. (0800)  . saw palmetto 500 MG capsule Take 500 mg by mouth 3 (three) times daily.  . TURMERIC PO Take 1 capsule by mouth 3 (three) times daily as needed (for inflammation.).  Marland Kitchen vitamin B-12 (CYANOCOBALAMIN) 1000 MCG tablet Take 1,000 mcg by mouth daily. (0800)  . [DISCONTINUED] amLODipine (NORVASC) 5 MG tablet Take 1 tablet (5 mg total) by mouth daily. Please call [phone] to schedule appointment for additional refills thanks. (2nd attempt)  . [DISCONTINUED] atorvastatin (LIPITOR) 80 MG tablet TAKE 1 TABLET BY MOUTH EVERY DAY AT 6PM. Please keep upcoming appt in October for future refills. Thank you  . [DISCONTINUED] nitroGLYCERIN (NITROSTAT) 0.4 MG SL tablet Place 1 tablet (0.4 mg total) under the tongue every 5 (five) minutes x 3 doses as needed for chest pain.  . [DISCONTINUED] ticagrelor (BRILINTA) 90 MG TABS tablet Take 1 tablet (90 mg total) by mouth 2 (two) times daily. Please keep upcoming appt for further refills     Allergies: No Known Allergies  Social History: The patient  reports that he quit smoking about 2 months ago. His smoking use included cigarettes. He started smoking about 46 years ago. He has a 11.75 pack-year smoking history. He  has never used smokeless tobacco. He reports that he drinks alcohol. He reports that he has current or past drug history. Drug: Marijuana.   Family History: The patient's family history includes Alzheimer's disease in his mother; Anxiety disorder in his mother; CAD in his father; Depression in his father; High blood pressure in his mother.   Review of Systems: Please see the history of present illness.   Otherwise, the review of systems is positive for none.   All other systems are reviewed and negative.   Physical Exam: VS:  BP (!) 158/90 (BP Location: Left Arm, Patient Position: Sitting, Cuff Size: Normal)   Pulse 75  Ht 5\' 10"  (1.778 m)   Wt 209 lb 12.8 oz (95.2 kg)   BMI 30.10 kg/m  .  BMI Body mass index is 30.1 kg/m.  Wt Readings from Last 3 Encounters:  09/29/18 209 lb 12.8 oz (95.2 kg)  07/18/17 212 lb (96.2 kg)  07/04/17 215 lb (97.5 kg)    General: Alert and in no acute distress.  Looks older than his stated age.  HEENT: Normal. But with poor dentition - missing teeth.  Neck: Supple, no JVD, carotid bruits, or masses noted.  Cardiac: Regular rate and rhythm. Heart tones are distant. No edema.  Respiratory:  Lungs are clear to auscultation bilaterally with normal work of breathing.  GI: Soft and nontender.  MS: No deformity or atrophy. Gait and ROM intact.  Skin: Warm and dry. Color is normal.  Neuro:  Strength and sensation are intact and no gross focal deficits noted.  Psych: Alert, appropriate and with normal affect.   LABORATORY DATA:  EKG:  EKG is ordered today. This demonstrates NSR with septal Q's - reviewed with Dr. Tamala Julian.  Lab Results  Component Value Date   WBC 8.6 11/15/2015   HGB 13.3 07/18/2017   HCT 39.0 07/18/2017   PLT 225 11/15/2015   GLUCOSE 94 07/18/2017   CHOL 88 (L) 11/15/2015   TRIG 54 11/15/2015   HDL 41 11/15/2015   LDLCALC 36 11/15/2015   ALT 17 11/15/2015   AST 17 11/15/2015   NA 141 07/18/2017   K 3.2 (L) 07/18/2017   CL 101  07/18/2017   CREATININE 1.10 07/18/2017   BUN 16 07/18/2017   CO2 26 11/15/2015   TSH 1.899 02/19/2013   INR 1.07 09/15/2015   HGBA1C 5.9 (H) 09/16/2015       BNP (last 3 results) No results for input(s): BNP in the last 8760 hours.  ProBNP (last 3 results) No results for input(s): PROBNP in the last 8760 hours.   Other Studies Reviewed Today:  Left Heart Cath and Coronary Angiography 08/2015  Conclusion    Prox Cx lesion, 20% stenosed.  The left ventricular systolic function is normal.  Mid RCA lesion, 60% stenosed.  1st Mrg lesion, 95% stenosed. Post intervention, there is a 0% residual stenosis.   1. Single vessel obstructive CAD involving the first OM 2. Borderline eccentric stenosis in the mid RCA with normal FFR of 0.93. 3. Normal LV function 4. Successful stenting of the first OM with a DES.  Plan: DAPT for one year. Aggressive risk factor modification. Anticipate DC in am.     Assessment/Plan:  1. CAD - chronic chest pain - discussed with Dr. Tamala Julian - will proceed with Lexiscan. We are stopping the Brilinta today - he has been on DAPT for 3 years now - this may improve his shortness of breath.   2. DOE - progressive - may be more related to his smoking.   3. HTN - BP not controlled - restarting Norvasc today.   4. Tobacco abuse/vaping - I do not get the feeling that he is going to stop.   5. HLD - on statin - labs from June noted.   6. General health - he is wanting his PSA checked today.   Current medicines are reviewed with the patient today.  The patient does not have concerns regarding medicines other than what has been noted above.  The following changes have been made:  See above.  Labs/ tests ordered today include:    Orders Placed This Encounter  Procedures  . Basic metabolic panel  . CBC  . PSA  . MYOCARDIAL PERFUSION IMAGING  . EKG 12-Lead     Disposition:   FU with Dr. Tamala Julian in one year - sooner if his Myoview is abnormal.     Patient is agreeable to this plan and will call if any problems develop in the interim.   SignedTruitt Merle, NP  09/29/2018 10:55 AM  [facility] [address] [place], [place]  [address] Phone: [phone] Fax: [phone]

## 2018-10-01 ENCOUNTER — Ambulatory Visit (HOSPITAL_COMMUNITY): Payer: 59 | Attending: Cardiology

## 2018-10-01 DIAGNOSIS — Z720 Tobacco use: Secondary | ICD-10-CM | POA: Diagnosis present

## 2018-10-01 DIAGNOSIS — I2510 Atherosclerotic heart disease of native coronary artery without angina pectoris: Secondary | ICD-10-CM | POA: Diagnosis present

## 2018-10-01 DIAGNOSIS — I25118 Atherosclerotic heart disease of native coronary artery with other forms of angina pectoris: Secondary | ICD-10-CM | POA: Diagnosis present

## 2018-10-01 DIAGNOSIS — E7849 Other hyperlipidemia: Secondary | ICD-10-CM | POA: Diagnosis present

## 2018-10-01 DIAGNOSIS — R0602 Shortness of breath: Secondary | ICD-10-CM | POA: Diagnosis present

## 2018-10-01 LAB — MYOCARDIAL PERFUSION IMAGING
LV dias vol: 107 mL (ref 62–150)
LV sys vol: 48 mL
Peak HR: 76 {beats}/min
Rest HR: 53 {beats}/min
SDS: 0
SRS: 0
SSS: 0
TID: 1.18

## 2018-10-01 MED ORDER — REGADENOSON 0.4 MG/5ML IV SOLN
0.4000 mg | Freq: Once | INTRAVENOUS | Status: AC
  Administered 2018-10-01: 0.4 mg via INTRAVENOUS

## 2018-10-01 MED ORDER — TECHNETIUM TC 99M TETROFOSMIN IV KIT
10.2000 | PACK | Freq: Once | INTRAVENOUS | Status: AC | PRN
  Administered 2018-10-01: 10.2 via INTRAVENOUS
  Filled 2018-10-01: qty 11

## 2018-10-01 MED ORDER — TECHNETIUM TC 99M TETROFOSMIN IV KIT
31.9000 | PACK | Freq: Once | INTRAVENOUS | Status: AC | PRN
  Administered 2018-10-01: 31.9 via INTRAVENOUS
  Filled 2018-10-01: qty 32

## 2018-10-06 ENCOUNTER — Other Ambulatory Visit: Payer: Self-pay | Admitting: *Deleted

## 2018-10-06 MED ORDER — KLOR-CON M10 10 MEQ PO TBCR: 10.0000 meq | EXTENDED_RELEASE_TABLET | Freq: Two times a day (BID) | ORAL | 1 refills | Status: DC

## 2018-10-12 ENCOUNTER — Other Ambulatory Visit: Payer: Self-pay | Admitting: Interventional Cardiology

## 2018-10-14 NOTE — Telephone Encounter (Signed)
Patient Instructions by Burtis Junes, NP at 09/29/2018 10:00 AM  Author: Burtis Junes, NP Author Type: Nurse Practitioner Filed: 09/29/2018 10:48 AM  Note Status: Addendum Cosign: Cosign Not Required Encounter Date: 09/29/2018  Editor: Burtis Junes, NP (Nurse Practitioner)  Prior Versions: 1. Burtis Junes, NP (Nurse Practitioner) at 09/29/2018 10:09 AM - Signed    We will be checking the following labs today - BMET, CBC, PSA  If you have labs (blood work) drawn today and your tests are completely normal, you will receive your results only by:  [facility] (if you have MyChart) OR  A paper copy in the mail If you have any lab test that is abnormal or we need to change your treatment, we will call you to review the results.   Medication Instructions:    Continue with your current medicines. BUT  STOP Brilinta

## 2019-06-25 ENCOUNTER — Other Ambulatory Visit: Payer: Self-pay | Admitting: Nurse Practitioner

## 2019-06-25 DIAGNOSIS — I25118 Atherosclerotic heart disease of native coronary artery with other forms of angina pectoris: Secondary | ICD-10-CM

## 2019-06-25 DIAGNOSIS — Z720 Tobacco use: Secondary | ICD-10-CM

## 2019-06-25 DIAGNOSIS — I2510 Atherosclerotic heart disease of native coronary artery without angina pectoris: Secondary | ICD-10-CM

## 2019-06-25 DIAGNOSIS — E7849 Other hyperlipidemia: Secondary | ICD-10-CM

## 2019-06-25 DIAGNOSIS — R0602 Shortness of breath: Secondary | ICD-10-CM

## 2019-08-23 ENCOUNTER — Other Ambulatory Visit: Payer: Self-pay | Admitting: Nurse Practitioner

## 2019-08-23 DIAGNOSIS — I25118 Atherosclerotic heart disease of native coronary artery with other forms of angina pectoris: Secondary | ICD-10-CM

## 2019-08-23 DIAGNOSIS — I2510 Atherosclerotic heart disease of native coronary artery without angina pectoris: Secondary | ICD-10-CM

## 2019-08-23 DIAGNOSIS — R0602 Shortness of breath: Secondary | ICD-10-CM

## 2019-08-23 DIAGNOSIS — E7849 Other hyperlipidemia: Secondary | ICD-10-CM

## 2019-08-23 DIAGNOSIS — Z720 Tobacco use: Secondary | ICD-10-CM

## 2019-09-16 ENCOUNTER — Other Ambulatory Visit: Payer: Self-pay | Admitting: Nurse Practitioner

## 2019-09-16 DIAGNOSIS — E7849 Other hyperlipidemia: Secondary | ICD-10-CM

## 2019-09-16 DIAGNOSIS — R0602 Shortness of breath: Secondary | ICD-10-CM

## 2019-09-16 DIAGNOSIS — I2510 Atherosclerotic heart disease of native coronary artery without angina pectoris: Secondary | ICD-10-CM

## 2019-09-16 DIAGNOSIS — I25118 Atherosclerotic heart disease of native coronary artery with other forms of angina pectoris: Secondary | ICD-10-CM

## 2019-09-16 DIAGNOSIS — Z720 Tobacco use: Secondary | ICD-10-CM

## 2019-09-23 ENCOUNTER — Other Ambulatory Visit: Payer: Self-pay

## 2019-09-23 DIAGNOSIS — Z20822 Contact with and (suspected) exposure to covid-19: Secondary | ICD-10-CM

## 2019-09-24 LAB — NOVEL CORONAVIRUS, NAA: SARS-CoV-2, NAA: NOT DETECTED

## 2019-10-08 ENCOUNTER — Other Ambulatory Visit: Payer: Self-pay | Admitting: Nurse Practitioner

## 2019-10-08 DIAGNOSIS — R0602 Shortness of breath: Secondary | ICD-10-CM

## 2019-10-08 DIAGNOSIS — I2510 Atherosclerotic heart disease of native coronary artery without angina pectoris: Secondary | ICD-10-CM

## 2019-10-08 DIAGNOSIS — I25118 Atherosclerotic heart disease of native coronary artery with other forms of angina pectoris: Secondary | ICD-10-CM

## 2019-10-08 DIAGNOSIS — E7849 Other hyperlipidemia: Secondary | ICD-10-CM

## 2019-10-08 DIAGNOSIS — Z720 Tobacco use: Secondary | ICD-10-CM

## 2019-11-01 ENCOUNTER — Other Ambulatory Visit: Payer: Self-pay | Admitting: Nurse Practitioner

## 2019-11-01 DIAGNOSIS — I2510 Atherosclerotic heart disease of native coronary artery without angina pectoris: Secondary | ICD-10-CM

## 2019-11-01 DIAGNOSIS — E7849 Other hyperlipidemia: Secondary | ICD-10-CM

## 2019-11-01 DIAGNOSIS — I25118 Atherosclerotic heart disease of native coronary artery with other forms of angina pectoris: Secondary | ICD-10-CM

## 2019-11-01 DIAGNOSIS — Z720 Tobacco use: Secondary | ICD-10-CM

## 2019-11-01 DIAGNOSIS — R0602 Shortness of breath: Secondary | ICD-10-CM

## 2019-11-17 ENCOUNTER — Other Ambulatory Visit: Payer: Self-pay | Admitting: Nurse Practitioner

## 2019-11-17 DIAGNOSIS — Z720 Tobacco use: Secondary | ICD-10-CM

## 2019-11-17 DIAGNOSIS — I2510 Atherosclerotic heart disease of native coronary artery without angina pectoris: Secondary | ICD-10-CM

## 2019-11-17 DIAGNOSIS — R0602 Shortness of breath: Secondary | ICD-10-CM

## 2019-11-17 DIAGNOSIS — E7849 Other hyperlipidemia: Secondary | ICD-10-CM

## 2019-11-17 DIAGNOSIS — I25118 Atherosclerotic heart disease of native coronary artery with other forms of angina pectoris: Secondary | ICD-10-CM

## 2019-12-10 ENCOUNTER — Other Ambulatory Visit: Payer: Self-pay | Admitting: Nurse Practitioner

## 2019-12-10 DIAGNOSIS — R0602 Shortness of breath: Secondary | ICD-10-CM

## 2019-12-10 DIAGNOSIS — I2510 Atherosclerotic heart disease of native coronary artery without angina pectoris: Secondary | ICD-10-CM

## 2019-12-10 DIAGNOSIS — I25118 Atherosclerotic heart disease of native coronary artery with other forms of angina pectoris: Secondary | ICD-10-CM

## 2019-12-10 DIAGNOSIS — E7849 Other hyperlipidemia: Secondary | ICD-10-CM

## 2019-12-10 DIAGNOSIS — Z720 Tobacco use: Secondary | ICD-10-CM

## 2019-12-18 ENCOUNTER — Other Ambulatory Visit: Payer: Self-pay | Admitting: Nurse Practitioner

## 2019-12-18 DIAGNOSIS — I25118 Atherosclerotic heart disease of native coronary artery with other forms of angina pectoris: Secondary | ICD-10-CM

## 2019-12-18 DIAGNOSIS — E7849 Other hyperlipidemia: Secondary | ICD-10-CM

## 2019-12-18 DIAGNOSIS — R0602 Shortness of breath: Secondary | ICD-10-CM

## 2019-12-18 DIAGNOSIS — I2510 Atherosclerotic heart disease of native coronary artery without angina pectoris: Secondary | ICD-10-CM

## 2019-12-18 DIAGNOSIS — Z720 Tobacco use: Secondary | ICD-10-CM

## 2019-12-28 ENCOUNTER — Other Ambulatory Visit: Payer: Self-pay | Admitting: Nurse Practitioner

## 2019-12-28 DIAGNOSIS — Z720 Tobacco use: Secondary | ICD-10-CM

## 2019-12-28 DIAGNOSIS — E7849 Other hyperlipidemia: Secondary | ICD-10-CM

## 2019-12-28 DIAGNOSIS — I2510 Atherosclerotic heart disease of native coronary artery without angina pectoris: Secondary | ICD-10-CM

## 2019-12-28 DIAGNOSIS — I25118 Atherosclerotic heart disease of native coronary artery with other forms of angina pectoris: Secondary | ICD-10-CM

## 2019-12-28 DIAGNOSIS — R0602 Shortness of breath: Secondary | ICD-10-CM

## 2019-12-31 ENCOUNTER — Telehealth: Payer: Self-pay | Admitting: Interventional Cardiology

## 2019-12-31 NOTE — Telephone Encounter (Signed)
We are recommending the COVID-19 vaccine to all of our patients. Cardiac medications (including blood thinners) should not deter anyone from being vaccinated and there is no need to hold any of those medications prior to vaccine administration.     Currently, there is a hotline to call (active 12/04/19) to schedule vaccination appointments as no walk-ins will be accepted.   Number: [phone].    If an appointment is not available please go to [email or web address] to sign up for notification when additional vaccine appointments are available.   If you have further questions or concerns about the vaccine process, please visit [email or web address] or contact your primary care physician.   

## 2020-01-03 ENCOUNTER — Other Ambulatory Visit: Payer: Self-pay | Admitting: Nurse Practitioner

## 2020-01-03 DIAGNOSIS — E7849 Other hyperlipidemia: Secondary | ICD-10-CM

## 2020-01-03 DIAGNOSIS — Z720 Tobacco use: Secondary | ICD-10-CM

## 2020-01-03 DIAGNOSIS — I2510 Atherosclerotic heart disease of native coronary artery without angina pectoris: Secondary | ICD-10-CM

## 2020-01-03 DIAGNOSIS — R0602 Shortness of breath: Secondary | ICD-10-CM

## 2020-01-03 DIAGNOSIS — I25118 Atherosclerotic heart disease of native coronary artery with other forms of angina pectoris: Secondary | ICD-10-CM

## 2020-01-12 ENCOUNTER — Other Ambulatory Visit: Payer: Self-pay | Admitting: Nurse Practitioner

## 2020-01-12 DIAGNOSIS — I25118 Atherosclerotic heart disease of native coronary artery with other forms of angina pectoris: Secondary | ICD-10-CM

## 2020-01-12 DIAGNOSIS — Z720 Tobacco use: Secondary | ICD-10-CM

## 2020-01-12 DIAGNOSIS — I2510 Atherosclerotic heart disease of native coronary artery without angina pectoris: Secondary | ICD-10-CM

## 2020-01-12 DIAGNOSIS — E7849 Other hyperlipidemia: Secondary | ICD-10-CM

## 2020-01-12 DIAGNOSIS — R0602 Shortness of breath: Secondary | ICD-10-CM

## 2020-01-23 ENCOUNTER — Other Ambulatory Visit: Payer: Self-pay | Admitting: Nurse Practitioner

## 2020-01-23 DIAGNOSIS — R0602 Shortness of breath: Secondary | ICD-10-CM

## 2020-01-23 DIAGNOSIS — Z720 Tobacco use: Secondary | ICD-10-CM

## 2020-01-23 DIAGNOSIS — I25118 Atherosclerotic heart disease of native coronary artery with other forms of angina pectoris: Secondary | ICD-10-CM

## 2020-01-23 DIAGNOSIS — E7849 Other hyperlipidemia: Secondary | ICD-10-CM

## 2020-01-23 DIAGNOSIS — I2510 Atherosclerotic heart disease of native coronary artery without angina pectoris: Secondary | ICD-10-CM

## 2020-02-02 ENCOUNTER — Encounter: Payer: Self-pay | Admitting: Cardiology

## 2020-02-02 ENCOUNTER — Telehealth (INDEPENDENT_AMBULATORY_CARE_PROVIDER_SITE_OTHER): Payer: Self-pay | Admitting: Cardiology

## 2020-02-02 ENCOUNTER — Other Ambulatory Visit: Payer: Self-pay

## 2020-02-02 DIAGNOSIS — Z720 Tobacco use: Secondary | ICD-10-CM

## 2020-02-02 DIAGNOSIS — E7849 Other hyperlipidemia: Secondary | ICD-10-CM

## 2020-02-02 DIAGNOSIS — I2510 Atherosclerotic heart disease of native coronary artery without angina pectoris: Secondary | ICD-10-CM

## 2020-02-02 MED ORDER — ATORVASTATIN CALCIUM 80 MG PO TABS: ORAL_TABLET | ORAL | 3 refills | Status: DC

## 2020-02-02 MED ORDER — AMLODIPINE BESYLATE 5 MG PO TABS: 5.0000 mg | ORAL_TABLET | Freq: Every day | ORAL | 3 refills | Status: DC

## 2020-02-02 NOTE — Progress Notes (Signed)
Virtual Visit via Telephone Note   This visit type was conducted due to national recommendations for restrictions regarding the COVID-19 Pandemic (e.g. social distancing) in an effort to limit this patient's exposure and mitigate transmission in our community.  Due to his co-morbid illnesses, this patient is at least at moderate risk for complications without adequate follow up.  This format is felt to be most appropriate for this patient at this time.  The patient did not have access to video technology/had technical difficulties with video requiring transitioning to audio format only (telephone).  All issues noted in this document were discussed and addressed.  No physical exam could be performed with this format.  Please refer to the patient's chart for his  consent to telehealth for [facility].   The patient was identified using 2 identifiers.  Date:  02/02/2020   ID:  Joseph Fitzpatrick, DOB November 04, 1951, MRN [id number]  Patient Location: Home Provider Location: Office  PCP:  Leighton Ruff, MD  Cardiologist:  Sinclair Grooms, MD   Electrophysiologist:  None   Evaluation Performed:  Follow-Up Visit  Chief Complaint:  CAD  History of Present Illness:    Joseph WHITMILL is a 69 y.o. male with known CAD with prior stent to the LCX, prior NSTEMI in2016 withOM1 felt to be the culprit vessel and this was successfully treated with a 2.25 x 32 mm Promus Premier drug-eluting stent.Fractional flow reserve was measured across the RCA lesion and was normal at 0.93. His other issues include HTN, depression, tobacco abuse and HLD. He did have an abdominal aortogram in 06/2017 for claudication - found to have minimal disease in the aortoiliac arterial system and 50% stenosis in distal superfical femoral artery.   Seen in May of 2018 - smoking 1 cig per day - otherwise, felt to be doing ok.   Last visit 09/29/18  He has been able to stop smoking completely but tells me he is vaping  instead - continues to smoke cigars too (just doesn't inhale). He has been out of his Norvasc for several months - could not get refilled. BP is up. He is taking his Brilinta. He feels like his shortness of breath is progressive and worse with activity. He continues to have a chest discomfort - in the left upper chest - come and goes - not exertional - says he has had this for years - even prior to the stent - this has not changed. Asking about generic Lipitor. Needs NTG refilled - does not sound like he is taking. No bleeding. He is upset that his PCP did not do a PSA check on him. He has had recent URI - slowly resolving.    Brilinta was stopped on the last visit, Nuc study with EF 56% no ischemia low risk study.  Added amlodipine, tobacco with vaping  HLD   Today he is doing very well .  No chest pain and SOB.  He has his name in to receive COVID vaccine. He did the 3 Ws and has not had COVID.  He continues to Vape but not as much and has reduced his nicotine by half.  He has a new Apple Watch and it gives pulse ox and EKG - he would like to be able to send strips if issues occur.  He needs refills on amlodipine and lipitor.    Reviewed labs from PCP office and HDL excellent at 71, LDL 60  His A1C is 6,  Cr 1.110 and  TSH 2.280  His COPD is stable.  His diabetes is controlled.  His PSA was normal.   He does use CPAP at night.      The patient does not have symptoms concerning for COVID-19 infection (fever, chills, cough, or new shortness of breath).    Past Medical History:  Diagnosis Date  . Anxiety   . Borderline diabetes    a. 08/2015 HbA1c = 5.9.  . BPH (benign prostatic hypertrophy)   . CAD (coronary artery disease)    a. 08/2015 NSTEMI/PCI: LM nl, LAD nl, RI nl, LCX 20p, OM1 95 (2.25x32 Pomus Premier DES), RCA 47m (FFR 0.93)->Med Rx, EF 55-65%.  . Depression   . Essential hypertension   . GERD (gastroesophageal reflux disease)   . Hematuria   . History of duodenal ulcer   . History  of hiatal hernia    "suspected; never confirmed"  . History of nephrolithiasis   . History of pneumonia 1993 X 1  . History of substance abuse ([place])   . Hypercholesterolemia   . Hypokalemia   . IBS (irritable bowel syndrome)   . PVCs (premature ventricular contractions)   . Tobacco abuse    Past Surgical History:  Procedure Laterality Date  . ABDOMINAL AORTOGRAM W/LOWER EXTREMITY N/A 07/18/2017   Procedure: ABDOMINAL AORTOGRAM W/LOWER EXTREMITY;  Surgeon: Conrad Blawnox, MD;  Location: [facility];  Service: Cardiovascular;  Laterality: N/A;  . CARDIAC CATHETERIZATION N/A 09/16/2015   Procedure: Left Heart Cath and Coronary Angiography;  Surgeon: Peter M Martinique, MD;  Location: [facility];  Service: Cardiovascular;  Laterality: N/A;  . CARDIAC CATHETERIZATION  09/16/2015   Procedure: Intravascular Pressure Wire/FFR Study;  Surgeon: Peter M Martinique, MD;  Location: [facility];  Service: Cardiovascular;;  . CARDIAC CATHETERIZATION  09/16/2015   Procedure: Coronary Stent Intervention;  Surgeon: Peter M Martinique, MD;  Location: [facility];  Service: Cardiovascular;;  . CHOLECYSTECTOMY OPEN    . TONSILLECTOMY       Current Meds  Medication Sig  . amLODipine (NORVASC) 5 MG tablet Take 1 tablet (5 mg total) by mouth daily.  Marland Kitchen aspirin EC 81 MG EC tablet Take 1 tablet (81 mg total) by mouth daily.  Marland Kitchen atorvastatin (LIPITOR) 80 MG tablet TAKE 1 TAB BY MOUTH EVERY DAY AT 6PM.  . clonazePAM (KLONOPIN) 1 MG tablet Take 1 mg by mouth 3 (three) times daily as needed for anxiety.  . hydrOXYzine (VISTARIL) 25 MG capsule Take 25-75 mg by mouth 3 (three) times daily. Take 1 capsule (25mg ) at 0800,2 capsules (50mg ) at 1400, and 3 capsules (75 mg) at 2000.  Marland Kitchen Loratadine (CLARITIN) 10 MG CAPS Take 10 mg by mouth daily as needed (for allergies.).   Marland Kitchen losartan-hydrochlorothiazide (HYZAAR) 50-12.5 MG per tablet Take 1 tablet by mouth daily. (0800)  . Menthol-Zinc Oxide (GOLD BOND EX)  Apply 1 application topically daily.  . nitroGLYCERIN (NITROSTAT) 0.4 MG SL tablet Place 1 tablet (0.4 mg total) under the tongue every 5 (five) minutes x 3 doses as needed for chest pain.  . Omega-3 Fatty Acids (FISH OIL) 1200 MG CAPS Take 1,200 mg by mouth 2 (two) times daily. (0800 & 2000)  . PARoxetine (PAXIL) 20 MG tablet Take 20 mg by mouth daily. Taking with 40mg  to make 60mg  daily  . PARoxetine (PAXIL) 40 MG tablet Take 40 mg by mouth daily. (0800) Taking with 20mg  to make 60mg  daily  . Potassium Chloride (KLOR-CON 10 PO) Take  20 mEq by mouth 2 (two) times daily.  . saw palmetto 500 MG capsule Take 500 mg by mouth 3 (three) times daily.  . TURMERIC PO Take 1 capsule by mouth 3 (three) times daily as needed (for inflammation.).  Marland Kitchen vitamin B-12 (CYANOCOBALAMIN) 1000 MCG tablet Take 1,000 mcg by mouth daily. (0800)  . [DISCONTINUED] amLODipine (NORVASC) 5 MG tablet Take 1 tablet (5 mg total) by mouth daily. Please make overdue appt with Dr. Tamala Julian before anymore refills. 3rd and Final Attempt  . [DISCONTINUED] atorvastatin (LIPITOR) 80 MG tablet TAKE 1 TAB BY MOUTH EVERY DAY AT 6PM. *MAKE APPT FOR REFILLS*     Allergies:   Patient has no known allergies.   Social History   Tobacco Use  . Smoking status: Former Smoker    Packs/day: 0.25    Years: 47.00    Pack years: 11.75    Types: Cigarettes    Start date: 02/20/1972    Quit date: 07/30/2018    Years since quitting: 1.5  . Smokeless tobacco: Never Used  Substance Use Topics  . Alcohol use: Yes    Alcohol/week: 0.0 standard drinks    Comment: Hx of EtOH abuse, quit in 1982  . Drug use: Yes    Types: Marijuana    Comment: "smoked marijuana 40 years quit in ~ 2014, did a little of qthing in the 68's for ~ 3 yrs"     Family Hx: The patient's family history includes Alzheimer's disease in his mother; Anxiety disorder in his mother; CAD in his father; Depression in his father; High blood pressure in his mother.  ROS:   Please  see the history of present illness.    General:no colds or fevers, no weight changes Skin:no rashes or ulcers HEENT:no blurred vision, no congestion CV:see HPI PUL:see HPI GI:no diarrhea constipation or melena, no indigestion GU:no hematuria, no dysuria MS:no joint pain, no claudication Neuro:no syncope, no lightheadedness Endo:+ diabetes, no thyroid disease  All other systems reviewed and are negative.   Prior CV studies:   The following studies were reviewed today:  nuc study 10/01/18   Nuclear stress EF: 56%. Normal wall motion.  There was no ST segment deviation noted during stress.  Defect 1: There is a small defect of mild severity present in the apex location. No ischemia  This is a low risk study. No ischemia identified.   Candee Furbish, MD  Cardiac cath 08/2015  Prox Cx lesion, 20% stenosed.  The left ventricular systolic function is normal.  Mid RCA lesion, 60% stenosed.  1st Mrg lesion, 95% stenosed. Post intervention, there is a 0% residual stenosis.   1. Single vessel obstructive CAD involving the first OM 2. Borderline eccentric stenosis in the mid RCA with normal FFR of 0.93. 3. Normal LV function 4. Successful stenting of the first OM with a DES.  Plan: DAPT for one year. Aggressive risk factor modification. Anticipate DC in am.   Labs/Other Tests and Data Reviewed:    EKG:  An ECG dated 09/29/2018 was personally reviewed today and demonstrated:  SR at 24 old septal MI   Recent Labs: No results found for requested labs within last 8760 hours.   Recent Lipid Panel Lab Results  Component Value Date/Time   CHOL 88 (L) 11/15/2015 08:57 AM   TRIG 54 11/15/2015 08:57 AM   HDL 41 11/15/2015 08:57 AM   CHOLHDL 2.1 11/15/2015 08:57 AM   LDLCALC 36 11/15/2015 08:57 AM    Wt Readings from Last  3 Encounters:  02/02/20 210 lb (95.3 kg)  10/01/18 209 lb (94.8 kg)  09/29/18 209 lb 12.8 oz (95.2 kg)     Objective:    Vital Signs:  BP 123/67    Pulse 73   Ht 5\' 10"  (1.778 m)   Wt 210 lb (95.3 kg)   BMI 30.13 kg/m    VITAL SIGNS:  reviewed General: NAD Pulmonary can speak in complete sentences without SOB Neuro A&O X3, answers question approp.  ASSESSMENT & PLAN:    1. CAD no chest pain and neg nuc study in 2019 pt is feeling well.  On ASA alone.  2. HTn BP controlled with addition of amlodipine 3. Tobacco use/vaping has decreased use and amount of nicotine.  Plan to continue to decrease. 4. HLD on statin and LDL well controlled, continue statin.   COVID-19 Education: The signs and symptoms of COVID-19 were discussed with the patient and how to seek care for testing (follow up with PCP or arrange E-visit).  The importance of social distancing was discussed today.  Time:   Today, I have spent  18  minutes with the patient with telehealth technology discussing the above problems.     Medication Adjustments/Labs and Tests Ordered: Current medicines are reviewed at length with the patient today.  Concerns regarding medicines are outlined above.   Tests Ordered: No orders of the defined types were placed in this encounter.   Medication Changes: Meds ordered this encounter  Medications  . amLODipine (NORVASC) 5 MG tablet    Sig: Take 1 tablet (5 mg total) by mouth daily.    Dispense:  90 tablet    Refill:  3  . atorvastatin (LIPITOR) 80 MG tablet    Sig: TAKE 1 TAB BY MOUTH EVERY DAY AT 6PM.    Dispense:  90 tablet    Refill:  3    Follow Up:  In Person in 1 year(s)  Signed, Cecilie Kicks, NP  02/02/2020 4:45 PM     [facility]

## 2020-02-02 NOTE — Patient Instructions (Signed)
Medication Instructions:  Your physician recommends that you continue on your current medications as directed. Please refer to the Current Medication list given to you today.  Refills for your Lipitor and Amlodipine as been sent to you pharmacy  Lab Work: None ordered   If you have labs (blood work) drawn today and your tests are completely normal, you will receive your results only by: Marland Kitchen MyChart Message (if you have MyChart) OR . A paper copy in the mail If you have any lab test that is abnormal or we need to change your treatment, we will call you to review the results.   Testing/Procedures: None ordered   Follow-Up: At [facility], you and your health needs are our priority.  As part of our continuing mission to provide you with exceptional heart care, we have created designated Provider Care Teams.  These Care Teams include your primary Cardiologist (physician) and Advanced Practice Providers (APPs -  Physician Assistants and Nurse Practitioners) who all work together to provide you with the care you need, when you need it.  We recommend signing up for the patient portal called "MyChart".  Sign up information is provided on this After Visit Summary.  MyChart is used to connect with patients for Virtual Visits (Telemedicine).  Patients are able to view lab/test results, encounter notes, upcoming appointments, etc.  Non-urgent messages can be sent to your provider as well.   To learn more about what you can do with MyChart, go to [email or web address].    Your next appointment:   12 month(s)  The format for your next appointment:   In Person  Provider:   You may see Sinclair Grooms, MD or one of the following Advanced Practice Providers on your designated Care Team:    Truitt Merle, NP  Cecilie Kicks, NP  Kathyrn Drown, NP    Other Instructions None

## 2020-06-13 ENCOUNTER — Telehealth: Payer: Self-pay | Admitting: *Deleted

## 2020-06-13 NOTE — Telephone Encounter (Signed)
Dr Tamala Julian can you review and comment on this dentist's request to use concentrated platelets to help heal a bone graft.  The patient had a DES to the [place] in 2016, low risk Myoview in 2019.  He was doing well according to telehealth visit in March 2021.  Thanks  Kerin Ransom PA-C 06/13/2020 4:56 PM

## 2020-06-13 NOTE — Telephone Encounter (Signed)
   [facility] Pre-operative Risk Assessment    HEARTCARE STAFF: - Please ensure there is not already an duplicate clearance open for this procedure. - Under Visit Info/Reason for Call, type in Other and utilize the format Clearance MM/DD/YY or Clearance TBD. Do not use dashes or single digits. - If request is for dental extraction, please clarify the # of teeth to be extracted.  Request for surgical clearance: PER DR. MOHORN, DDS, PT REPORTED THAT DR. Tamala Julian WAS CONCERNED ABOUT TREATMENT SINCE IT INVOLVED THE PATIENTS PLATELETS.   1. What type of surgery is being performed? USE OF PRF TO ENHANCE HEALING OF BONE GRAFT. DR. Loyal Gambler, DDS PLAN IS TO USE 2-3 TUBES OF AUTOLOGOUS BLOOD SPUN IN CENTRIFUGE TO CONCENTRATE PLATELETS THEN MIX WITH BONE GRAFT TO HELP EXTRACTION SITE HEAL BETTER. USE OF THIS BLOOD PRODUCT CAN HELP GRAFTED SITES HEAL BETTER IN OLDER PATIENTS. ARE THERE ANY CONCERNS FROM YOUR PERSPECTIVE?    2. When is this surgery scheduled? TBD   3. What type of clearance is required (medical clearance vs. Pharmacy clearance to hold med vs. Both)? MEDICAL  4. Are there any medications that need to be held prior to surgery and how long? PT IS ON ASA; I DO NOT SEE THAT ANY MEDS ARE LISTED TO BE HEALTH   5. Practice name and name of physician performing surgery? [facility]; DR. Lyndel Safe MOHORN, D.D.S   6. What is the office phone number? [phone]   7.   What is the office fax number? [phone]  8.   Anesthesia type (None, local, MAC, general) ? NONE LISTED    Julaine Hua 06/13/2020, 4:27 PM  _________________________________________________________________   (provider comments below)

## 2020-06-16 NOTE — Telephone Encounter (Signed)
I think it is okay to proceed with the platelet rich plasma fibrin now 5 years after stent implantation.  I am not sure how effective this maneuver is but would leave that to the discretion of the dentist.

## 2020-06-17 NOTE — Telephone Encounter (Signed)
   Primary Cardiologist: Sinclair Grooms, MD  Chart reviewed as part of pre-operative protocol coverage. Given past medical history and time since last visit, based on ACC/AHA guidelines, BRAEDYN KAUK would be at acceptable risk for the planned procedure without further cardiovascular testing.   I will route this recommendation to the requesting party via Epic fax function and remove from pre-op pool.  Please call with questions.   Jossie Ng. Lelani Garnett NP-C    06/17/2020, 9:51 AM [place] [place] [address] Office [phone] Fax [phone]

## 2020-08-30 DIAGNOSIS — I10 Essential (primary) hypertension: Secondary | ICD-10-CM | POA: Diagnosis not present

## 2020-08-30 DIAGNOSIS — I252 Old myocardial infarction: Secondary | ICD-10-CM | POA: Diagnosis not present

## 2020-08-30 DIAGNOSIS — F1010 Alcohol abuse, uncomplicated: Secondary | ICD-10-CM | POA: Diagnosis not present

## 2020-08-30 DIAGNOSIS — B009 Herpesviral infection, unspecified: Secondary | ICD-10-CM | POA: Diagnosis not present

## 2020-08-30 DIAGNOSIS — G4733 Obstructive sleep apnea (adult) (pediatric): Secondary | ICD-10-CM | POA: Diagnosis not present

## 2020-08-30 DIAGNOSIS — F419 Anxiety disorder, unspecified: Secondary | ICD-10-CM | POA: Diagnosis not present

## 2020-08-30 DIAGNOSIS — I2510 Atherosclerotic heart disease of native coronary artery without angina pectoris: Secondary | ICD-10-CM | POA: Diagnosis not present

## 2020-08-30 DIAGNOSIS — E1169 Type 2 diabetes mellitus with other specified complication: Secondary | ICD-10-CM | POA: Diagnosis not present

## 2020-08-30 DIAGNOSIS — E785 Hyperlipidemia, unspecified: Secondary | ICD-10-CM | POA: Diagnosis not present

## 2020-08-30 DIAGNOSIS — F339 Major depressive disorder, recurrent, unspecified: Secondary | ICD-10-CM | POA: Diagnosis not present

## 2020-09-05 DIAGNOSIS — F064 Anxiety disorder due to known physiological condition: Secondary | ICD-10-CM | POA: Diagnosis not present

## 2020-09-20 ENCOUNTER — Telehealth: Payer: Self-pay

## 2020-09-20 NOTE — Telephone Encounter (Signed)
CPAP supply order form received and reviewed by MD. Orders signed and faxed back to [facility].

## 2020-10-06 DIAGNOSIS — G4733 Obstructive sleep apnea (adult) (pediatric): Secondary | ICD-10-CM | POA: Diagnosis not present

## 2020-10-31 ENCOUNTER — Other Ambulatory Visit: Payer: Self-pay | Admitting: Cardiology

## 2020-10-31 DIAGNOSIS — I2510 Atherosclerotic heart disease of native coronary artery without angina pectoris: Secondary | ICD-10-CM

## 2020-10-31 DIAGNOSIS — Z720 Tobacco use: Secondary | ICD-10-CM

## 2020-10-31 DIAGNOSIS — E7849 Other hyperlipidemia: Secondary | ICD-10-CM

## 2020-11-14 DIAGNOSIS — R310 Gross hematuria: Secondary | ICD-10-CM | POA: Diagnosis not present

## 2020-11-14 DIAGNOSIS — R160 Hepatomegaly, not elsewhere classified: Secondary | ICD-10-CM | POA: Diagnosis not present

## 2020-11-14 DIAGNOSIS — F1010 Alcohol abuse, uncomplicated: Secondary | ICD-10-CM | POA: Diagnosis not present

## 2020-11-14 DIAGNOSIS — Z7141 Alcohol abuse counseling and surveillance of alcoholic: Secondary | ICD-10-CM | POA: Diagnosis not present

## 2020-11-15 ENCOUNTER — Other Ambulatory Visit: Payer: Self-pay | Admitting: Family Medicine

## 2020-11-15 DIAGNOSIS — R160 Hepatomegaly, not elsewhere classified: Secondary | ICD-10-CM

## 2020-11-24 DIAGNOSIS — R310 Gross hematuria: Secondary | ICD-10-CM | POA: Diagnosis not present

## 2020-11-24 DIAGNOSIS — N401 Enlarged prostate with lower urinary tract symptoms: Secondary | ICD-10-CM | POA: Diagnosis not present

## 2020-11-24 DIAGNOSIS — R3915 Urgency of urination: Secondary | ICD-10-CM | POA: Diagnosis not present

## 2020-12-05 DIAGNOSIS — F064 Anxiety disorder due to known physiological condition: Secondary | ICD-10-CM | POA: Diagnosis not present

## 2020-12-07 ENCOUNTER — Other Ambulatory Visit: Payer: Self-pay

## 2020-12-09 DIAGNOSIS — I700 Atherosclerosis of aorta: Secondary | ICD-10-CM | POA: Diagnosis not present

## 2020-12-09 DIAGNOSIS — N200 Calculus of kidney: Secondary | ICD-10-CM | POA: Diagnosis not present

## 2020-12-09 DIAGNOSIS — M545 Low back pain, unspecified: Secondary | ICD-10-CM | POA: Diagnosis not present

## 2020-12-09 DIAGNOSIS — R310 Gross hematuria: Secondary | ICD-10-CM | POA: Diagnosis not present

## 2020-12-09 DIAGNOSIS — B009 Herpesviral infection, unspecified: Secondary | ICD-10-CM | POA: Diagnosis not present

## 2020-12-21 DIAGNOSIS — R310 Gross hematuria: Secondary | ICD-10-CM | POA: Diagnosis not present

## 2020-12-22 ENCOUNTER — Other Ambulatory Visit: Payer: Self-pay

## 2020-12-23 DIAGNOSIS — R0981 Nasal congestion: Secondary | ICD-10-CM | POA: Diagnosis not present

## 2020-12-23 DIAGNOSIS — R319 Hematuria, unspecified: Secondary | ICD-10-CM | POA: Diagnosis not present

## 2021-02-16 DIAGNOSIS — I889 Nonspecific lymphadenitis, unspecified: Secondary | ICD-10-CM | POA: Diagnosis not present

## 2021-03-06 DIAGNOSIS — N451 Epididymitis: Secondary | ICD-10-CM | POA: Diagnosis not present

## 2021-03-06 DIAGNOSIS — I252 Old myocardial infarction: Secondary | ICD-10-CM | POA: Diagnosis not present

## 2021-03-06 DIAGNOSIS — E1169 Type 2 diabetes mellitus with other specified complication: Secondary | ICD-10-CM | POA: Diagnosis not present

## 2021-03-06 DIAGNOSIS — R1013 Epigastric pain: Secondary | ICD-10-CM | POA: Diagnosis not present

## 2021-03-06 DIAGNOSIS — K1121 Acute sialoadenitis: Secondary | ICD-10-CM | POA: Diagnosis not present

## 2021-03-06 DIAGNOSIS — I10 Essential (primary) hypertension: Secondary | ICD-10-CM | POA: Diagnosis not present

## 2021-03-06 DIAGNOSIS — E785 Hyperlipidemia, unspecified: Secondary | ICD-10-CM | POA: Diagnosis not present

## 2021-03-06 DIAGNOSIS — I2510 Atherosclerotic heart disease of native coronary artery without angina pectoris: Secondary | ICD-10-CM | POA: Diagnosis not present

## 2021-03-07 ENCOUNTER — Other Ambulatory Visit: Payer: Self-pay | Admitting: Cardiology

## 2021-03-07 DIAGNOSIS — Z720 Tobacco use: Secondary | ICD-10-CM

## 2021-03-07 DIAGNOSIS — I2510 Atherosclerotic heart disease of native coronary artery without angina pectoris: Secondary | ICD-10-CM

## 2021-03-07 DIAGNOSIS — E7849 Other hyperlipidemia: Secondary | ICD-10-CM

## 2021-03-15 DIAGNOSIS — R197 Diarrhea, unspecified: Secondary | ICD-10-CM | POA: Diagnosis not present

## 2021-03-15 DIAGNOSIS — R5383 Other fatigue: Secondary | ICD-10-CM | POA: Diagnosis not present

## 2021-04-03 DIAGNOSIS — Z87891 Personal history of nicotine dependence: Secondary | ICD-10-CM | POA: Diagnosis not present

## 2021-04-03 DIAGNOSIS — G4733 Obstructive sleep apnea (adult) (pediatric): Secondary | ICD-10-CM | POA: Diagnosis not present

## 2021-04-03 DIAGNOSIS — U071 COVID-19: Secondary | ICD-10-CM | POA: Diagnosis not present

## 2021-04-03 DIAGNOSIS — I10 Essential (primary) hypertension: Secondary | ICD-10-CM | POA: Diagnosis not present

## 2021-04-03 DIAGNOSIS — I252 Old myocardial infarction: Secondary | ICD-10-CM | POA: Diagnosis not present

## 2021-04-03 DIAGNOSIS — F064 Anxiety disorder due to known physiological condition: Secondary | ICD-10-CM | POA: Diagnosis not present

## 2021-04-04 ENCOUNTER — Encounter: Payer: Self-pay | Admitting: Oncology

## 2021-04-04 ENCOUNTER — Telehealth: Payer: Self-pay | Admitting: Oncology

## 2021-04-04 MED ORDER — MOLNUPIRAVIR EUA 200MG CAPSULE: 4.0000 | ORAL_CAPSULE | Freq: Two times a day (BID) | ORAL | 0 refills | Status: AC

## 2021-04-04 NOTE — Telephone Encounter (Signed)
Outpatient Oral COVID Treatment Note  I connected with Joseph Fitzpatrick on 04/04/2021/4:00 PM by telephone and verified that I am speaking with the correct person using two identifiers.  I discussed the limitations, risks, security, and privacy concerns of performing an evaluation and management service by telephone and the availability of in person appointments. I also discussed with the patient that there may be a patient responsible charge related to this service. The patient expressed understanding and agreed to proceed.  Patient location: Home Provider location: Clinic   Diagnosis: COVID-19 infection  Purpose of visit: Discussion of potential use of Molnupiravir or Paxlovid, a new treatment for mild to moderate COVID-19 viral infection in non-hospitalized patients.   Subjective: Patient is a 70 y.o. male who has been diagnosed with COVID 19 viral infection.  Their symptoms began on   Past Medical History:  Diagnosis Date  . Anxiety   . Borderline diabetes    a. 08/2015 HbA1c = 5.9.  . BPH (benign prostatic hypertrophy)   . CAD (coronary artery disease)    a. 08/2015 NSTEMI/PCI: LM nl, LAD nl, RI nl, LCX 20p, OM1 95 (2.25x32 Pomus Premier DES), RCA 16m (FFR 0.93)->Med Rx, EF 55-65%.  . Depression   . Essential hypertension   . GERD (gastroesophageal reflux disease)   . Hematuria   . History of duodenal ulcer   . History of hiatal hernia    "suspected; never confirmed"  . History of nephrolithiasis   . History of pneumonia 1993 X 1  . History of substance abuse ([place])   . Hypercholesterolemia   . Hypokalemia   . IBS (irritable bowel syndrome)   . PVCs (premature ventricular contractions)   . Tobacco abuse     No Known Allergies   Current Outpatient Medications:  .  amLODipine (NORVASC) 5 MG tablet, Take 1 tablet (5 mg total) by mouth daily. Please make overdue appt with Dr. Tamala Julian before anymore refills. Thank you 1st attempt, Disp: 30 tablet, Rfl: 0 .  aspirin EC 81 MG EC  tablet, Take 1 tablet (81 mg total) by mouth daily., Disp: , Rfl:  .  atorvastatin (LIPITOR) 80 MG tablet, TAKE 1 TAB BY MOUTH EVERY DAY AT 6PM. Please make overdue appt with Dr. Tamala Julian before anymore refills. Thank you 1st attempt, Disp: 30 tablet, Rfl: 0 .  clonazePAM (KLONOPIN) 1 MG tablet, Take 1 mg by mouth 3 (three) times daily as needed for anxiety., Disp: , Rfl:  .  hydrOXYzine (VISTARIL) 25 MG capsule, Take 25-75 mg by mouth 3 (three) times daily. Take 1 capsule (25mg ) at 0800,2 capsules (50mg ) at 1400, and 3 capsules (75 mg) at 2000., Disp: , Rfl:  .  Loratadine (CLARITIN) 10 MG CAPS, Take 10 mg by mouth daily as needed (for allergies.). , Disp: , Rfl:  .  losartan-hydrochlorothiazide (HYZAAR) 50-12.5 MG per tablet, Take 1 tablet by mouth daily. (0800), Disp: , Rfl:  .  Menthol-Zinc Oxide (GOLD BOND EX), Apply 1 application topically daily., Disp: , Rfl:  .  nitroGLYCERIN (NITROSTAT) 0.4 MG SL tablet, Place 1 tablet (0.4 mg total) under the tongue every 5 (five) minutes x 3 doses as needed for chest pain., Disp: 25 tablet, Rfl: 3 .  Omega-3 Fatty Acids (FISH OIL) 1200 MG CAPS, Take 1,200 mg by mouth 2 (two) times daily. (0800 & 2000), Disp: , Rfl:  .  PARoxetine (PAXIL) 20 MG tablet, Take 20 mg by mouth daily. Taking with 40mg  to make 60mg  daily, Disp: , Rfl:  .  PARoxetine (PAXIL) 40 MG tablet, Take 40 mg by mouth daily. (0800) Taking with 20mg  to make 60mg  daily, Disp: , Rfl: 11 .  Potassium Chloride (KLOR-CON 10 PO), Take 20 mEq by mouth 2 (two) times daily., Disp: , Rfl:  .  saw palmetto 500 MG capsule, Take 500 mg by mouth 3 (three) times daily., Disp: , Rfl:  .  TURMERIC PO, Take 1 capsule by mouth 3 (three) times daily as needed (for inflammation.)., Disp: , Rfl:  .  vitamin B-12 (CYANOCOBALAMIN) 1000 MCG tablet, Take 1,000 mcg by mouth daily. (0800), Disp: , Rfl:   Objective: Patient sounds well.  They are in no apparent distress.  Breathing is non labored.  Mood and behavior are  normal.  Laboratory Data:  No results found for this or any previous visit (from the past 2160 hour(s)).   Assessment: 70 y.o. male with mild/moderate COVID 19 viral infection diagnosed on 04/03/21 at high risk for progression to severe COVID 19.  Plan:  This patient is a 70 y.o. male that meets the following criteria for Emergency Use Authorization of: Molnupiravir  1. Age >18 yr 2. SARS-COV-2 positive test 3. Symptom onset < 5 days 4. Mild-to-moderate COVID disease with high risk for severe progression to hospitalization or death   I have spoken and communicated the following to the patient or parent/caregiver regarding: 1. Molnupiravir is an unapproved drug that is authorized for use under an [employer] production planner.  2. There are no adequate, approved, available products for the treatment of COVID-19 in adults who have mild-to-moderate COVID-19 and are at high risk for progressing to severe COVID-19, including hospitalization or death. 3. Other therapeutics are currently authorized. For additional information on all products authorized for treatment or prevention of COVID-19, please see [email or web address].  4. There are benefits and risks of taking this treatment as outlined in the "Fact Sheet for Patients and Caregivers."  5. "Fact Sheet for Patients and Caregivers" was reviewed with patient. A hard copy will be provided to patient from pharmacy prior to the patient receiving treatment. 6. Patients should continue to self-isolate and use infection control measures (e.g., wear mask, isolate, social distance, avoid sharing personal items, clean and disinfect "high touch" surfaces, and frequent handwashing) according to CDC guidelines.  7. The patient or parent/caregiver has the option to accept or refuse treatment. 8. [place] has established a pregnancy surveillance  program. 9. Females of childbearing potential should use a reliable method of contraception correctly and consistently, as applicable, for the duration of treatment and for 4 days after the last dose of Molnupiravir. 44. Males of reproductive potential who are sexually active with females of childbearing potential should use a reliable method of contraception correctly and consistently during treatment and for at least 3 months after the last dose. 11. Pregnancy status and risk was assessed. Patient verbalized understanding of precautions.   After reviewing above information with the patient, the patient agrees to receive molnupiravir.  Follow up instructions:    . Take prescription BID x 5 days as directed . Reach out to pharmacist for counseling on medication if desired . For concerns regarding further COVID symptoms please follow up with your PCP or urgent care . For urgent or life-threatening issues, seek care at your local emergency department  The patient was provided an opportunity to ask questions, and all were answered. The patient agreed with the plan and demonstrated an understanding of the instructions.   [id number] [place],  [facility], [place] [address]  Phone:  [phone] Fax:  [phone]   The patient was advised to call their PCP or seek an in-person evaluation if the symptoms worsen or if the condition fails to improve as anticipated.   I provided 10 minutes of non face-to-face telephone visit time during this encounter, and > 50% was spent counseling as documented under my assessment & plan.  Jacquelin Hawking, NP 04/04/2021 /4:00 PM

## 2021-05-26 DIAGNOSIS — K5900 Constipation, unspecified: Secondary | ICD-10-CM | POA: Diagnosis not present

## 2021-05-26 DIAGNOSIS — Z1211 Encounter for screening for malignant neoplasm of colon: Secondary | ICD-10-CM | POA: Diagnosis not present

## 2021-05-26 DIAGNOSIS — R1013 Epigastric pain: Secondary | ICD-10-CM | POA: Diagnosis not present

## 2021-05-26 DIAGNOSIS — R1030 Lower abdominal pain, unspecified: Secondary | ICD-10-CM | POA: Diagnosis not present

## 2021-06-05 DIAGNOSIS — E785 Hyperlipidemia, unspecified: Secondary | ICD-10-CM | POA: Diagnosis not present

## 2021-06-05 DIAGNOSIS — I10 Essential (primary) hypertension: Secondary | ICD-10-CM | POA: Diagnosis not present

## 2021-06-05 DIAGNOSIS — R1011 Right upper quadrant pain: Secondary | ICD-10-CM | POA: Diagnosis not present

## 2021-06-05 DIAGNOSIS — B880 Other acariasis: Secondary | ICD-10-CM | POA: Diagnosis not present

## 2021-06-05 DIAGNOSIS — R1013 Epigastric pain: Secondary | ICD-10-CM | POA: Diagnosis not present

## 2021-06-05 DIAGNOSIS — Z1211 Encounter for screening for malignant neoplasm of colon: Secondary | ICD-10-CM | POA: Diagnosis not present

## 2021-06-05 DIAGNOSIS — U099 Post covid-19 condition, unspecified: Secondary | ICD-10-CM | POA: Diagnosis not present

## 2021-06-22 ENCOUNTER — Other Ambulatory Visit: Payer: Self-pay | Admitting: Interventional Cardiology

## 2021-06-22 DIAGNOSIS — I2510 Atherosclerotic heart disease of native coronary artery without angina pectoris: Secondary | ICD-10-CM

## 2021-06-22 DIAGNOSIS — Z720 Tobacco use: Secondary | ICD-10-CM

## 2021-06-22 DIAGNOSIS — E7849 Other hyperlipidemia: Secondary | ICD-10-CM

## 2021-07-19 DIAGNOSIS — D123 Benign neoplasm of transverse colon: Secondary | ICD-10-CM | POA: Diagnosis not present

## 2021-07-19 DIAGNOSIS — K319 Disease of stomach and duodenum, unspecified: Secondary | ICD-10-CM | POA: Diagnosis not present

## 2021-07-19 DIAGNOSIS — D122 Benign neoplasm of ascending colon: Secondary | ICD-10-CM | POA: Diagnosis not present

## 2021-07-19 DIAGNOSIS — R1010 Upper abdominal pain, unspecified: Secondary | ICD-10-CM | POA: Diagnosis not present

## 2021-07-19 DIAGNOSIS — D120 Benign neoplasm of cecum: Secondary | ICD-10-CM | POA: Diagnosis not present

## 2021-07-19 DIAGNOSIS — Z1211 Encounter for screening for malignant neoplasm of colon: Secondary | ICD-10-CM | POA: Diagnosis not present

## 2021-07-19 DIAGNOSIS — K3189 Other diseases of stomach and duodenum: Secondary | ICD-10-CM | POA: Diagnosis not present

## 2021-07-19 DIAGNOSIS — K2930 Chronic superficial gastritis without bleeding: Secondary | ICD-10-CM | POA: Diagnosis not present

## 2021-07-25 DIAGNOSIS — D120 Benign neoplasm of cecum: Secondary | ICD-10-CM | POA: Diagnosis not present

## 2021-07-25 DIAGNOSIS — K2930 Chronic superficial gastritis without bleeding: Secondary | ICD-10-CM | POA: Diagnosis not present

## 2021-07-25 DIAGNOSIS — D122 Benign neoplasm of ascending colon: Secondary | ICD-10-CM | POA: Diagnosis not present

## 2021-07-25 DIAGNOSIS — D123 Benign neoplasm of transverse colon: Secondary | ICD-10-CM | POA: Diagnosis not present

## 2021-07-25 DIAGNOSIS — K319 Disease of stomach and duodenum, unspecified: Secondary | ICD-10-CM | POA: Diagnosis not present

## 2021-08-07 DIAGNOSIS — F064 Anxiety disorder due to known physiological condition: Secondary | ICD-10-CM | POA: Diagnosis not present

## 2021-08-09 DIAGNOSIS — G4733 Obstructive sleep apnea (adult) (pediatric): Secondary | ICD-10-CM | POA: Diagnosis not present

## 2021-08-10 DIAGNOSIS — I252 Old myocardial infarction: Secondary | ICD-10-CM | POA: Diagnosis not present

## 2021-08-10 DIAGNOSIS — E1169 Type 2 diabetes mellitus with other specified complication: Secondary | ICD-10-CM | POA: Diagnosis not present

## 2021-08-10 DIAGNOSIS — E785 Hyperlipidemia, unspecified: Secondary | ICD-10-CM | POA: Diagnosis not present

## 2021-08-10 DIAGNOSIS — G4733 Obstructive sleep apnea (adult) (pediatric): Secondary | ICD-10-CM | POA: Diagnosis not present

## 2021-08-10 DIAGNOSIS — I2510 Atherosclerotic heart disease of native coronary artery without angina pectoris: Secondary | ICD-10-CM | POA: Diagnosis not present

## 2021-08-10 DIAGNOSIS — F1010 Alcohol abuse, uncomplicated: Secondary | ICD-10-CM | POA: Diagnosis not present

## 2021-08-10 DIAGNOSIS — I10 Essential (primary) hypertension: Secondary | ICD-10-CM | POA: Diagnosis not present

## 2021-08-18 DIAGNOSIS — J029 Acute pharyngitis, unspecified: Secondary | ICD-10-CM | POA: Diagnosis not present

## 2021-08-18 DIAGNOSIS — R059 Cough, unspecified: Secondary | ICD-10-CM | POA: Diagnosis not present

## 2021-08-18 DIAGNOSIS — Z03818 Encounter for observation for suspected exposure to other biological agents ruled out: Secondary | ICD-10-CM | POA: Diagnosis not present

## 2021-08-24 ENCOUNTER — Other Ambulatory Visit: Payer: Self-pay | Admitting: Interventional Cardiology

## 2021-08-24 DIAGNOSIS — Z720 Tobacco use: Secondary | ICD-10-CM

## 2021-08-24 DIAGNOSIS — I2510 Atherosclerotic heart disease of native coronary artery without angina pectoris: Secondary | ICD-10-CM

## 2021-08-24 DIAGNOSIS — E7849 Other hyperlipidemia: Secondary | ICD-10-CM

## 2021-08-31 DIAGNOSIS — I2510 Atherosclerotic heart disease of native coronary artery without angina pectoris: Secondary | ICD-10-CM | POA: Diagnosis not present

## 2021-08-31 DIAGNOSIS — E1169 Type 2 diabetes mellitus with other specified complication: Secondary | ICD-10-CM | POA: Diagnosis not present

## 2021-08-31 DIAGNOSIS — F1010 Alcohol abuse, uncomplicated: Secondary | ICD-10-CM | POA: Diagnosis not present

## 2021-08-31 DIAGNOSIS — E785 Hyperlipidemia, unspecified: Secondary | ICD-10-CM | POA: Diagnosis not present

## 2021-08-31 DIAGNOSIS — R739 Hyperglycemia, unspecified: Secondary | ICD-10-CM | POA: Diagnosis not present

## 2021-08-31 DIAGNOSIS — I10 Essential (primary) hypertension: Secondary | ICD-10-CM | POA: Diagnosis not present

## 2021-08-31 DIAGNOSIS — I252 Old myocardial infarction: Secondary | ICD-10-CM | POA: Diagnosis not present

## 2021-08-31 DIAGNOSIS — Z87440 Personal history of urinary (tract) infections: Secondary | ICD-10-CM | POA: Diagnosis not present

## 2021-08-31 DIAGNOSIS — G4733 Obstructive sleep apnea (adult) (pediatric): Secondary | ICD-10-CM | POA: Diagnosis not present

## 2021-08-31 DIAGNOSIS — N390 Urinary tract infection, site not specified: Secondary | ICD-10-CM | POA: Diagnosis not present

## 2021-09-04 ENCOUNTER — Telehealth: Payer: Self-pay | Admitting: Interventional Cardiology

## 2021-09-04 NOTE — Telephone Encounter (Signed)
Pt with intermittent chest discomfort for a little over 2 months along.  Feels this on the left side of his chest.  Does not radiate anywhere.  Has sweating and SOB at times associated with the discomfort.  Comes on whether exerting or sitting still.  Mostly occurs at night when he lays down.  Previously had an MI with atypical symptoms.  Recent vitals were 144/86 at PCP and they doubled his Losartan.  Went back last week and it was 114/68.  Scheduled pt to come in Wednesday for eval with the DOD.  Pt agreeable to plan.

## 2021-09-04 NOTE — Telephone Encounter (Signed)
Pt c/o of Chest Pain: STAT if CP now or developed within 24 hours  1. Are you having CP right now? no  2. Are you experiencing any other symptoms (ex. SOB, nausea, vomiting, sweating)? Sweating and SOB occasionally, but also has COPD, no symptoms now  3. How long have you been experiencing CP? Couple months  4. Is your CP continuous or coming and going? Comes and goes  5. Have you taken Nitroglycerin? No   Patient states he has been having chest pain for a couple months on and off. He states he also occasionally has sweating and SOB. Patient states he was not having any symptoms now and was not audibly SOB on the phone. He says he also has COPD. He states the chest pain has been high on the left side of his chest so he is not sure it is heart related.  ?

## 2021-09-05 NOTE — Progress Notes (Deleted)
Cardiology Office Note   Date:  09/05/2021   ID:  Joseph Fitzpatrick, DOB 15-Jul-1951, MRN [id number]  PCP:  Leighton Ruff, MD    No chief complaint on file.    Wt Readings from Last 3 Encounters:  02/02/20 210 lb (95.3 kg)  10/01/18 209 lb (94.8 kg)  09/29/18 209 lb 12.8 oz (95.2 kg)       History of Present Illness: Joseph Fitzpatrick is a 70 y.o. male  with CAD, prior MI.  Records show: "He has known CAD with prior stent to the LCX, prior NSTEMI in 2016 with OM1 felt to be the culprit vessel and this was successfully treated with a 2.25 x 32 mm Promus Premier drug-eluting stent.  Fractional flow reserve was measured across the RCA lesion and was normal at 0.93. His other issues include HTN, depression, tobacco abuse and HLD. He did have an abdominal aortogram in 06/2017 for claudication - found to have minimal disease in the aortoiliac arterial system and 50% stenosis in distal superfical femoral artery. "  He went from smoking to vaping. Has had chronic sx noted in the past: "He feels like his shortness of breath is progressive and worse with activity. He continues to have a chest discomfort - in the left upper chest - come and goes - not exertional - says he has had this for years - even prior to the stent - this has not changed. "   2019 lexiscan stress test: Low risk stress test. Normal pumping function and no ischemia noted. "   Past Medical History:  Diagnosis Date   Anxiety    Borderline diabetes    a. 08/2015 HbA1c = 5.9.   BPH (benign prostatic hypertrophy)    CAD (coronary artery disease)    a. 08/2015 NSTEMI/PCI: LM nl, LAD nl, RI nl, LCX 20p, OM1 95 (2.25x32 Pomus Premier DES), RCA 92m (FFR 0.93)->Med Rx, EF 55-65%.   Depression    Essential hypertension    GERD (gastroesophageal reflux disease)    Hematuria    History of duodenal ulcer    History of hiatal hernia    "suspected; never confirmed"   History of nephrolithiasis    History of pneumonia  1993 X 1   History of substance abuse ([place])    Hypercholesterolemia    Hypokalemia    IBS (irritable bowel syndrome)    PVCs (premature ventricular contractions)    Tobacco abuse     Past Surgical History:  Procedure Laterality Date   ABDOMINAL AORTOGRAM W/LOWER EXTREMITY N/A 07/18/2017   Procedure: ABDOMINAL AORTOGRAM W/LOWER EXTREMITY;  Surgeon: Conrad Marlboro Meadows, MD;  Location: [place] [facility];  Service: Cardiovascular;  Laterality: N/A;   CARDIAC CATHETERIZATION N/A 09/16/2015   Procedure: Left Heart Cath and Coronary Angiography;  Surgeon: Peter M Martinique, MD;  Location: [place];  Service: Cardiovascular;  Laterality: N/A;   CARDIAC CATHETERIZATION  09/16/2015   Procedure: Intravascular Pressure Wire/FFR Study;  Surgeon: Peter M Martinique, MD;  Location: [facility];  Service: Cardiovascular;;   CARDIAC CATHETERIZATION  09/16/2015   Procedure: Coronary Stent Intervention;  Surgeon: Peter M Martinique, MD;  Location: [place];  Service: Cardiovascular;;   CHOLECYSTECTOMY OPEN     TONSILLECTOMY       Current Outpatient Medications  Medication Sig Dispense Refill   amLODipine (NORVASC) 5 MG tablet Take 1 tablet (5 mg total) by mouth daily. Please keep upcoming appt in January 2023 with Dr.  Smith before anymore refills. Thank you Final Attempt 30 tablet 3   aspirin EC 81 MG EC tablet Take 1 tablet (81 mg total) by mouth daily.     atorvastatin (LIPITOR) 80 MG tablet Take 1 tablet (80 mg total) by mouth daily. Please keep upcoming appt in January 2023 with Dr. Tamala Julian before anymore refills. Thank you Final Attempt 90 tablet 1   clonazePAM (KLONOPIN) 1 MG tablet Take 1 mg by mouth 3 (three) times daily as needed for anxiety.     hydrOXYzine (VISTARIL) 25 MG capsule Take 25-75 mg by mouth 3 (three) times daily. Take 1 capsule (25mg ) at 0800,2 capsules (50mg ) at 1400, and 3 capsules (75 mg) at 2000.     Loratadine (CLARITIN) 10 MG CAPS Take 10 mg by mouth daily as needed  (for allergies.).      losartan-hydrochlorothiazide (HYZAAR) 50-12.5 MG per tablet Take 1 tablet by mouth daily. (0800)     Menthol-Zinc Oxide (GOLD BOND EX) Apply 1 application topically daily.     nitroGLYCERIN (NITROSTAT) 0.4 MG SL tablet Place 1 tablet (0.4 mg total) under the tongue every 5 (five) minutes x 3 doses as needed for chest pain. 25 tablet 3   Omega-3 Fatty Acids (FISH OIL) 1200 MG CAPS Take 1,200 mg by mouth 2 (two) times daily. (0800 & 2000)     PARoxetine (PAXIL) 20 MG tablet Take 20 mg by mouth daily. Taking with 40mg  to make 60mg  daily     PARoxetine (PAXIL) 40 MG tablet Take 40 mg by mouth daily. (0800) Taking with 20mg  to make 60mg  daily  11   Potassium Chloride (KLOR-CON 10 PO) Take 20 mEq by mouth 2 (two) times daily.     saw palmetto 500 MG capsule Take 500 mg by mouth 3 (three) times daily.     TURMERIC PO Take 1 capsule by mouth 3 (three) times daily as needed (for inflammation.).     vitamin B-12 (CYANOCOBALAMIN) 1000 MCG tablet Take 1,000 mcg by mouth daily. (0800)     No current facility-administered medications for this visit.    Allergies:   Patient has no known allergies.    Social History:  The patient  reports that he quit smoking about 3 years ago. His smoking use included cigarettes. He started smoking about 49 years ago. He has a 11.75 pack-year smoking history. He has never used smokeless tobacco. He reports current alcohol use. He reports current drug use. Drug: Marijuana.   Family History:  The patient's ***family history includes Alzheimer's disease in his mother; Anxiety disorder in his mother; CAD in his father; Depression in his father; High blood pressure in his mother.    ROS:  Please see the history of present illness.   Otherwise, review of systems are positive for ***.   All other systems are reviewed and negative.    PHYSICAL EXAM: VS:  There were no vitals taken for this visit. , BMI There is no height or weight on file to calculate  BMI. GEN: Well nourished, well developed, in no acute distress HEENT: normal Neck: no JVD, carotid bruits, or masses Cardiac: ***RRR; no murmurs, rubs, or gallops,no edema  Respiratory:  clear to auscultation bilaterally, normal work of breathing GI: soft, nontender, nondistended, + BS MS: no deformity or atrophy Skin: warm and dry, no rash Neuro:  Strength and sensation are intact Psych: euthymic mood, full affect   EKG:   The ekg ordered today demonstrates ***   Recent Labs: No results  found for requested labs within last 8760 hours.   Lipid Panel    Component Value Date/Time   CHOL 88 (L) 11/15/2015 0857   TRIG 54 11/15/2015 0857   HDL 41 11/15/2015 0857   CHOLHDL 2.1 11/15/2015 0857   VLDL 11 11/15/2015 0857   LDLCALC 36 11/15/2015 0857     Other studies Reviewed: Additional studies/ records that were reviewed today with results demonstrating: ***.   ASSESSMENT AND PLAN:  CAD:  DOE: HTN: Tobacco abuse: Hyperlipidemia:   Current medicines are reviewed at length with the patient today.  The patient concerns regarding his medicines were addressed.  The following changes have been made:  No change***  Labs/ tests ordered today include: *** No orders of the defined types were placed in this encounter.   Recommend 150 minutes/week of aerobic exercise Low fat, low carb, high fiber diet recommended  Disposition:   FU in ***   Signed, Larae Grooms, MD  09/05/2021 9:09 AM    [facility] [address], [place], [place]  [address] Phone: [phone]; Fax: [phone]

## 2021-09-06 ENCOUNTER — Ambulatory Visit: Payer: PPO | Admitting: Interventional Cardiology

## 2021-09-06 DIAGNOSIS — Z720 Tobacco use: Secondary | ICD-10-CM

## 2021-09-06 DIAGNOSIS — E7849 Other hyperlipidemia: Secondary | ICD-10-CM

## 2021-09-06 DIAGNOSIS — I2510 Atherosclerotic heart disease of native coronary artery without angina pectoris: Secondary | ICD-10-CM

## 2021-11-06 DIAGNOSIS — F39 Unspecified mood [affective] disorder: Secondary | ICD-10-CM | POA: Diagnosis not present

## 2021-11-18 DIAGNOSIS — J189 Pneumonia, unspecified organism: Secondary | ICD-10-CM | POA: Diagnosis not present

## 2021-11-23 ENCOUNTER — Other Ambulatory Visit: Payer: Self-pay | Admitting: Interventional Cardiology

## 2021-11-23 DIAGNOSIS — I2510 Atherosclerotic heart disease of native coronary artery without angina pectoris: Secondary | ICD-10-CM

## 2021-11-23 DIAGNOSIS — E7849 Other hyperlipidemia: Secondary | ICD-10-CM

## 2021-11-23 DIAGNOSIS — Z720 Tobacco use: Secondary | ICD-10-CM

## 2021-12-03 NOTE — Progress Notes (Deleted)
Cardiology Office Note:    Date:  12/03/2021   ID:  Joseph Fitzpatrick, DOB 02-06-1951, MRN [id number]  PCP:  Leighton Ruff, MD (Inactive)  Cardiologist:  Sinclair Grooms, MD   Referring MD: Leighton Ruff, MD   No chief complaint on file.   History of Present Illness:    Joseph Fitzpatrick is a 71 y.o. male with a hx of CAD NSTEMI (Cfx [phone]), essential hypertension, depression, tobacco abuse, PAD (50% in superficial femoral artery), and hyperlipidemia.     ***  Past Medical History:  Diagnosis Date   Anxiety    Borderline diabetes    a. 08/2015 HbA1c = 5.9.   BPH (benign prostatic hypertrophy)    CAD (coronary artery disease)    a. 08/2015 NSTEMI/PCI: LM nl, LAD nl, RI nl, LCX 20p, OM1 95 (2.25x32 Pomus Premier DES), RCA 43m (FFR 0.93)->Med Rx, EF 55-65%.   Depression    Essential hypertension    GERD (gastroesophageal reflux disease)    Hematuria    History of duodenal ulcer    History of hiatal hernia    "suspected; never confirmed"   History of nephrolithiasis    History of pneumonia 1993 X 1   History of substance abuse ([place])    Hypercholesterolemia    Hypokalemia    IBS (irritable bowel syndrome)    PVCs (premature ventricular contractions)    Tobacco abuse     Past Surgical History:  Procedure Laterality Date   ABDOMINAL AORTOGRAM W/LOWER EXTREMITY N/A 07/18/2017   Procedure: ABDOMINAL AORTOGRAM W/LOWER EXTREMITY;  Surgeon: Conrad Lavon, MD;  Location: [facility];  Service: Cardiovascular;  Laterality: N/A;   CARDIAC CATHETERIZATION N/A 09/16/2015   Procedure: Left Heart Cath and Coronary Angiography;  Surgeon: Peter M Martinique, MD;  Location: [facility];  Service: Cardiovascular;  Laterality: N/A;   CARDIAC CATHETERIZATION  09/16/2015   Procedure: Intravascular Pressure Wire/FFR Study;  Surgeon: Peter M Martinique, MD;  Location: [facility];  Service: Cardiovascular;;   CARDIAC CATHETERIZATION  09/16/2015   Procedure: Coronary  Stent Intervention;  Surgeon: Peter M Martinique, MD;  Location: [facility];  Service: Cardiovascular;;   CHOLECYSTECTOMY OPEN     TONSILLECTOMY      Current Medications: No outpatient medications have been marked as taking for the 12/04/21 encounter (Appointment) with Belva Crome, MD.     Allergies:   Patient has no known allergies.   Social History   Socioeconomic History   Marital status: Married    Spouse name: Not on file   Number of children: Not on file   Years of education: Not on file   Highest education level: Not on file  Occupational History   Not on file  Tobacco Use   Smoking status: Former    Packs/day: 0.25    Years: 47.00    Pack years: 11.75    Types: Cigarettes    Start date: 02/20/1972    Quit date: 07/30/2018    Years since quitting: 3.3   Smokeless tobacco: Never  Vaping Use   Vaping Use: Never used  Substance and Sexual Activity   Alcohol use: Yes    Alcohol/week: 0.0 standard drinks    Comment: Hx of EtOH abuse, quit in 1982   Drug use: Yes    Types: Marijuana    Comment: "smoked marijuana 40 years quit in ~ 2014, did a little of qthing in the 1970's for ~ 3 yrs"  Sexual activity: Not Currently  Other Topics Concern   Not on file  Social History Narrative   Lives with his wife and 36 year old son.  Normally ambulates without assistive device, drives, completes ADLs without difficulty.     Social Determinants of Health   Financial Resource Strain: Not on file  Food Insecurity: Not on file  Transportation Needs: Not on file  Physical Activity: Not on file  Stress: Not on file  Social Connections: Not on file     Family History: The patient's family history includes Alzheimer's disease in his mother; Anxiety disorder in his mother; CAD in his father; Depression in his father; High blood pressure in his mother.  ROS:   Please see the history of present illness.    *** All other systems reviewed and are negative.  EKGs/Labs/Other  Studies Reviewed:    The following studies were reviewed today: ***  EKG:  EKG ***  Recent Labs: No results found for requested labs within last 8760 hours.  Recent Lipid Panel    Component Value Date/Time   CHOL 88 (L) 11/15/2015 0857   TRIG 54 11/15/2015 0857   HDL 41 11/15/2015 0857   CHOLHDL 2.1 11/15/2015 0857   VLDL 11 11/15/2015 0857   LDLCALC 36 11/15/2015 0857    Physical Exam:    VS:  There were no vitals taken for this visit.    Wt Readings from Last 3 Encounters:  02/02/20 210 lb (95.3 kg)  10/01/18 209 lb (94.8 kg)  09/29/18 209 lb 12.8 oz (95.2 kg)     GEN: ***. No acute distress HEENT: Normal NECK: No JVD. LYMPHATICS: No lymphadenopathy CARDIAC: *** murmur. RRR *** gallop, or edema. VASCULAR: *** Normal Pulses. No bruits. RESPIRATORY:  Clear to auscultation without rales, wheezing or rhonchi  ABDOMEN: Soft, non-tender, non-distended, No pulsatile mass, MUSCULOSKELETAL: No deformity  SKIN: Warm and dry NEUROLOGIC:  Alert and oriented x 3 PSYCHIATRIC:  Normal affect   ASSESSMENT:    1. Coronary artery disease involving native coronary artery of native heart without angina pectoris   2. Tobacco abuse   3. Other hyperlipidemia    PLAN:    In order of problems listed above:  ***   Medication Adjustments/Labs and Tests Ordered: Current medicines are reviewed at length with the patient today.  Concerns regarding medicines are outlined above.  No orders of the defined types were placed in this encounter.  No orders of the defined types were placed in this encounter.   There are no Patient Instructions on file for this visit.   Signed, Sinclair Grooms, MD  12/03/2021 9:22 AM    [facility]

## 2021-12-04 ENCOUNTER — Ambulatory Visit: Payer: PPO | Admitting: Interventional Cardiology

## 2021-12-04 DIAGNOSIS — I2510 Atherosclerotic heart disease of native coronary artery without angina pectoris: Secondary | ICD-10-CM

## 2021-12-04 DIAGNOSIS — Z720 Tobacco use: Secondary | ICD-10-CM

## 2021-12-04 DIAGNOSIS — E7849 Other hyperlipidemia: Secondary | ICD-10-CM

## 2021-12-09 ENCOUNTER — Other Ambulatory Visit: Payer: Self-pay | Admitting: Interventional Cardiology

## 2021-12-09 DIAGNOSIS — E7849 Other hyperlipidemia: Secondary | ICD-10-CM

## 2021-12-09 DIAGNOSIS — I2510 Atherosclerotic heart disease of native coronary artery without angina pectoris: Secondary | ICD-10-CM

## 2021-12-09 DIAGNOSIS — Z720 Tobacco use: Secondary | ICD-10-CM

## 2021-12-15 DIAGNOSIS — H6981 Other specified disorders of Eustachian tube, right ear: Secondary | ICD-10-CM | POA: Diagnosis not present

## 2021-12-15 DIAGNOSIS — H9011 Conductive hearing loss, unilateral, right ear, with unrestricted hearing on the contralateral side: Secondary | ICD-10-CM | POA: Diagnosis not present

## 2021-12-15 DIAGNOSIS — H6521 Chronic serous otitis media, right ear: Secondary | ICD-10-CM | POA: Diagnosis not present

## 2021-12-15 DIAGNOSIS — J310 Chronic rhinitis: Secondary | ICD-10-CM | POA: Diagnosis not present

## 2021-12-15 DIAGNOSIS — J343 Hypertrophy of nasal turbinates: Secondary | ICD-10-CM | POA: Diagnosis not present

## 2022-01-18 DIAGNOSIS — Z125 Encounter for screening for malignant neoplasm of prostate: Secondary | ICD-10-CM | POA: Diagnosis not present

## 2022-01-18 DIAGNOSIS — E785 Hyperlipidemia, unspecified: Secondary | ICD-10-CM | POA: Diagnosis not present

## 2022-01-18 DIAGNOSIS — I10 Essential (primary) hypertension: Secondary | ICD-10-CM | POA: Diagnosis not present

## 2022-01-18 DIAGNOSIS — N390 Urinary tract infection, site not specified: Secondary | ICD-10-CM | POA: Diagnosis not present

## 2022-01-18 DIAGNOSIS — Z7289 Other problems related to lifestyle: Secondary | ICD-10-CM | POA: Diagnosis not present

## 2022-01-18 DIAGNOSIS — R109 Unspecified abdominal pain: Secondary | ICD-10-CM | POA: Diagnosis not present

## 2022-01-18 DIAGNOSIS — F1011 Alcohol abuse, in remission: Secondary | ICD-10-CM | POA: Diagnosis not present

## 2022-01-18 DIAGNOSIS — G4733 Obstructive sleep apnea (adult) (pediatric): Secondary | ICD-10-CM | POA: Diagnosis not present

## 2022-01-18 DIAGNOSIS — E1169 Type 2 diabetes mellitus with other specified complication: Secondary | ICD-10-CM | POA: Diagnosis not present

## 2022-01-18 DIAGNOSIS — R5383 Other fatigue: Secondary | ICD-10-CM | POA: Diagnosis not present

## 2022-01-18 DIAGNOSIS — U071 COVID-19: Secondary | ICD-10-CM | POA: Diagnosis not present

## 2022-01-18 DIAGNOSIS — I2510 Atherosclerotic heart disease of native coronary artery without angina pectoris: Secondary | ICD-10-CM | POA: Diagnosis not present

## 2022-01-29 DIAGNOSIS — E785 Hyperlipidemia, unspecified: Secondary | ICD-10-CM | POA: Diagnosis not present

## 2022-01-29 DIAGNOSIS — N390 Urinary tract infection, site not specified: Secondary | ICD-10-CM | POA: Diagnosis not present

## 2022-01-29 DIAGNOSIS — E1169 Type 2 diabetes mellitus with other specified complication: Secondary | ICD-10-CM | POA: Diagnosis not present

## 2022-01-29 DIAGNOSIS — Z6833 Body mass index (BMI) 33.0-33.9, adult: Secondary | ICD-10-CM | POA: Diagnosis not present

## 2022-01-29 DIAGNOSIS — D649 Anemia, unspecified: Secondary | ICD-10-CM | POA: Diagnosis not present

## 2022-01-29 DIAGNOSIS — E876 Hypokalemia: Secondary | ICD-10-CM | POA: Diagnosis not present

## 2022-03-04 NOTE — Progress Notes (Signed)
?Cardiology Office Note:   ? ?Date:  03/06/2022  ? ?ID:  Joseph Fitzpatrick, DOB 28-Apr-1951, MRN [id number] ? ?PCP:  Leighton Ruff, MD (Inactive)  ?Cardiologist:  Sinclair Grooms, MD  ? ?Referring MD: Leighton Ruff, MD  ? ?Chief Complaint  ?Patient presents with  ? Coronary Artery Disease  ? Congestive Heart Failure  ? Shortness of Breath  ? Claudication  ? ? ?History of Present Illness:   ? ?Joseph Fitzpatrick is a 71 y.o. male with a hx of CAD with prior stent to the LCX, prior NSTEMI in 2016 with OM1 felt to be the culprit vessel and this was successfully treated with a 2.25 x 32 mm Promus Premier drug-eluting stent.  Fractional flow reserve was measured across the RCA lesion and was normal at 0.93. His other issues include HTN, depression, tobacco abuse and HLD. He did have an abdominal aortogram in 06/2017 for claudication - found to have minimal disease in the aortoiliac arterial system and 50% stenosis in distal superfical femoral artery.  ? ? ?Joseph Fitzpatrick is doing relatively well.  Is been quite sometime since I have seen him with my last office visit occurring in 2018.  He was last seen by cardiology during a telemedicine visit by Cecilie Kicks in March 2021.  No cardiology visits since that time. ? ?He complains of dyspnea on exertion, progressive over 6 months.  No orthopnea PND.  Used to be that he will stop walking because his legs would hurt.  In the past a vascular arterial Doppler study has been done in 2018 which moderately severe bilateral lower extremity occlusive disease. ? ?He denies angina although there is intermittent left focal parasternal chest discomfort described as a dull ache that lasts minutes when he lies down and then resolves.  It does not happen every night.  It is never occurred with physical activity. ? ?Past Medical History:  ?Diagnosis Date  ? Anxiety   ? Borderline diabetes   ? a. 08/2015 HbA1c = 5.9.  ? BPH (benign prostatic hypertrophy)   ? CAD (coronary artery disease)    ? a. 08/2015 NSTEMI/PCI: LM nl, LAD nl, RI nl, LCX 20p, OM1 95 (2.25x32 Pomus Premier DES), RCA 90m(FFR 0.93)->Med Rx, EF 55-65%.  ? Depression   ? Essential hypertension   ? GERD (gastroesophageal reflux disease)   ? Hematuria   ? History of duodenal ulcer   ? History of hiatal hernia   ? "suspected; never confirmed"  ? History of nephrolithiasis   ? History of pneumonia 1993 X 1  ? [place] of [place]   ? Hypercholesterolemia   ? Hypokalemia   ? IBS (irritable bowel syndrome)   ? PVCs (premature ventricular contractions)   ? Tobacco abuse   ? ? ?Past Surgical History:  ?Procedure Laterality Date  ? ABDOMINAL AORTOGRAM W/LOWER EXTREMITY N/A 07/18/2017  ? Procedure: ABDOMINAL AORTOGRAM W/LOWER EXTREMITY;  Surgeon: CConrad Richardton MD;  [facility];  Service: Cardiovascular;  Laterality: N/A;  ? CARDIAC CATHETERIZATION N/A 09/16/2015  ? Procedure: Left Heart Cath and Coronary Angiography;  Surgeon: Peter M JMartinique MD;  [facility];  Service: Cardiovascular;  Laterality: N/A;  ? CARDIAC CATHETERIZATION  09/16/2015  ? Procedure: Intravascular Pressure Wire/FFR Study;  Surgeon: Peter M JMartinique MD;  [facility];  Service: Cardiovascular;;  ? CARDIAC CATHETERIZATION  09/16/2015  ? Procedure: Coronary Stent Intervention;  Surgeon: Peter M JMartinique MD;  [facility];  Service: Cardiovascular;;  ? CHOLECYSTECTOMY OPEN    ? TONSILLECTOMY    ? ? ?Current Medications: ?Current Meds  ?Medication Sig  ? amLODipine (NORVASC) 5 MG tablet Take 1 tablet (5 mg total) by mouth daily. Please keep upcoming appt in April 2023 with Dr. Tamala Julian before anymore refills. Thank you Final Attempt  ? aspirin EC 81 MG EC tablet Take 1 tablet (81 mg total) by mouth daily.  ? atorvastatin (LIPITOR) 80 MG tablet Take 1 tablet (80 mg total) by mouth daily. Please keep upcoming appt in January 2023 with Dr. Tamala Julian before anymore refills. Thank you Final Attempt  ? clonazePAM  (KLONOPIN) 1 MG tablet Take 1 mg by mouth 3 (three) times daily as needed for anxiety.  ? hydrOXYzine (VISTARIL) 25 MG capsule Take 25-75 mg by mouth 3 (three) times daily. Take 1 capsule ('25mg'$ ) at 0800,2 capsules ('50mg'$ ) at 1400, and 3 capsules (75 mg) at 2000.  ? Loratadine 10 MG CAPS Take 10 mg by mouth daily as needed (for allergies.).   ? losartan-hydrochlorothiazide (HYZAAR) 50-12.5 MG per tablet Take 1 tablet by mouth daily. (0800)  ? Menthol-Zinc Oxide (GOLD BOND EX) Apply 1 application topically daily.  ? metFORMIN (GLUCOPHAGE-XR) 500 MG 24 hr tablet 500 mg daily.  ? nitrofurantoin, macrocrystal-monohydrate, (MACROBID) 100 MG capsule 100 mg daily.  ? nitroGLYCERIN (NITROSTAT) 0.4 MG SL tablet Place 1 tablet (0.4 mg total) under the tongue every 5 (five) minutes x 3 doses as needed for chest pain.  ? Omega-3 Fatty Acids (FISH OIL) 1200 MG CAPS Take 1,200 mg by mouth 2 (two) times daily. (0800 & 2000)  ? PARoxetine (PAXIL) 20 MG tablet Take 20 mg by mouth daily. Taking with '40mg'$  to make '60mg'$  daily  ? PARoxetine (PAXIL) 40 MG tablet Take 40 mg by mouth daily. (0800) Taking with '20mg'$  to make '60mg'$  daily  ? Potassium Chloride (KLOR-CON 10 PO) Take 30 mEq by mouth 2 (two) times daily.  ? sucralfate (CARAFATE) 1 g tablet Take 1 g by mouth 3 (three) times daily.  ? tamsulosin (FLOMAX) 0.4 MG CAPS capsule Take 0.4 mg by mouth at bedtime.  ? TURMERIC PO Take 1 capsule by mouth 3 (three) times daily as needed (for inflammation.).  ? vitamin B-12 (CYANOCOBALAMIN) 1000 MCG tablet Take 1,000 mcg by mouth daily. (0800)  ?  ? ?Allergies:   Patient has no known allergies.  ? ?Social History  ? ?Socioeconomic History  ? Marital status: Married  ?  Spouse name: Not on file  ? Number of children: Not on file  ? Years of education: Not on file  ? Highest education level: Not on file  ?Occupational History  ? Not on file  ?Tobacco Use  ? Smoking status: Former  ?  Packs/day: 0.25  ?  Years: 47.00  ?  Pack years: 11.75  ?  Types:  Cigarettes  ?  Start date: 02/20/1972  ?  Quit date: 07/30/2018  ?  Years since quitting: 3.6  ? Smokeless tobacco: Never  ?Vaping Use  ? Vaping Use: Never used  ?Substance and Sexual Activity  ? Alcohol use: Yes  ?  Alcohol/week: 0.0 standard drinks  ?  Comment: Hx of EtOH abuse, quit in 1982  ? Drug use: Yes  ?  Types: Marijuana  ?  Comment: "smoked marijuana 40 years quit in ~ 2014, did a little of qthing in the 1970's for ~ 3 yrs"  ? Sexual activity: Not Currently  ?Other  Topics Concern  ? Not on file  ?Social History Narrative  ? Lives with his wife and 39 year old son.  Normally ambulates without assistive device, drives, completes ADLs without difficulty.    ? ?Social Determinants of Health  ? ?Financial Resource Strain: Not on file  ?Food Insecurity: Not on file  ?Transportation Needs: Not on file  ?Physical Activity: Not on file  ?Stress: Not on file  ?Social Connections: Not on file  ?  ? ?Family History: ?The patient's family history includes Alzheimer's disease in his mother; Anxiety disorder in his mother; CAD in his father; Depression in his father; High blood pressure in his mother. ? ?ROS:   ?Please see the history of present illness.    ?Doing okay with the exception of limiting dyspnea on exertion.  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  Denies orthopnea.  All other systems reviewed and are negative. ? ?EKGs/Labs/Other Studies Reviewed:   ? ?The following studies were reviewed today: ?Bilateral lower extremity Doppler study 2018: ?Bilateral femoral occlusive disease up to 75% stenosed ? ?2D Doppler echocardiogram: ?No recent evaluation ? ?Coronary angiography and angioplasty 2016: ?Diagnostic ?Dominance: Right ?Intervention ? ? ? ? ?EKG:  EKG normal sinus rhythm, left anterior hemiblock, nonspecific T wave abnormality, poor R wave progression.  No change compared to prior except heart rate is slightly faster. ? ?Recent Labs: ?No results found for requested labs within last 8760 hours.  ?Recent Lipid Panel ?    ?Component Value Date/Time  ? CHOL 88 (L) 11/15/2015 0857  ? TRIG 54 11/15/2015 0857  ? HDL 41 11/15/2015 0857  ? CHOLHDL 2.1 11/15/2015 0857  ? VLDL 11 11/15/2015 0857  ? [place] 36 11/15/2015 0857  ? ?

## 2022-03-06 ENCOUNTER — Encounter: Payer: Self-pay | Admitting: Interventional Cardiology

## 2022-03-06 ENCOUNTER — Ambulatory Visit: Payer: PPO | Admitting: Interventional Cardiology

## 2022-03-06 VITALS — BP 128/72 | HR 90 | Ht 70.0 in | Wt 232.1 lb

## 2022-03-06 DIAGNOSIS — I10 Essential (primary) hypertension: Secondary | ICD-10-CM | POA: Diagnosis not present

## 2022-03-06 DIAGNOSIS — R0602 Shortness of breath: Secondary | ICD-10-CM | POA: Diagnosis not present

## 2022-03-06 DIAGNOSIS — I214 Non-ST elevation (NSTEMI) myocardial infarction: Secondary | ICD-10-CM | POA: Diagnosis not present

## 2022-03-06 DIAGNOSIS — E7800 Pure hypercholesterolemia, unspecified: Secondary | ICD-10-CM | POA: Diagnosis not present

## 2022-03-06 DIAGNOSIS — I739 Peripheral vascular disease, unspecified: Secondary | ICD-10-CM

## 2022-03-06 DIAGNOSIS — I2510 Atherosclerotic heart disease of native coronary artery without angina pectoris: Secondary | ICD-10-CM | POA: Diagnosis not present

## 2022-03-06 NOTE — Patient Instructions (Addendum)
Medication Instructions:  ?Your physician recommends that you continue on your current medications as directed. Please refer to the Current Medication list given to you today. ? ?*If you need a refill on your cardiac medications before your next appointment, please call your pharmacy* ? ? ?Lab Work: ?Pro BNP today ? ?If you have labs (blood work) drawn today and your tests are completely normal, you will receive your results only by: ?MyChart Message (if you have MyChart) OR ?A paper copy in the mail ?If you have any lab test that is abnormal or we need to change your treatment, we will call you to review the results. ? ? ?Testing/Procedures: ?Your physician has requested that you have an echocardiogram. Echocardiography is a painless test that uses sound waves to create images of your heart. It provides your doctor with information about the size and shape of your heart and how well your heart?s chambers and valves are working. This procedure takes approximately one hour. There are no restrictions for this procedure. ? ?Your physician has requested that you have a lower or upper extremity arterial duplex. This test is an ultrasound of the arteries in the legs or arms. It looks at arterial blood flow in the legs and arms. Allow one hour for Lower and Upper Arterial scans. There are no restrictions or special instructions ? ?Follow-Up: ?At [facility], you and your health needs are our priority.  As part of our continuing mission to provide you with exceptional heart care, we have created designated Provider Care Teams.  These Care Teams include your primary Cardiologist (physician) and Advanced Practice Providers (APPs -  Physician Assistants and Nurse Practitioners) who all work together to provide you with the care you need, when you need it. ? ?We recommend signing up for the patient portal called "MyChart".  Sign up information is provided on this After Visit Summary.  MyChart is used to connect with patients  for Virtual Visits (Telemedicine).  Patients are able to view lab/test results, encounter notes, upcoming appointments, etc.  Non-urgent messages can be sent to your provider as well.   ?To learn more about what you can do with MyChart, go to [email or web address].   ? ?Your next appointment:   ?6 month(s) ? ?The format for your next appointment:   ?In Person ? ?Provider:   ?Sinclair Grooms, MD  ? ? ?Other Instructions ? ? ?Important Information About Sugar ? ? ? ? ?  ?

## 2022-03-07 LAB — PRO B NATRIURETIC PEPTIDE: NT-Pro BNP: 898 pg/mL — ABNORMAL HIGH (ref 0–376)

## 2022-03-08 ENCOUNTER — Other Ambulatory Visit (INDEPENDENT_AMBULATORY_CARE_PROVIDER_SITE_OTHER): Payer: PPO

## 2022-03-08 DIAGNOSIS — I214 Non-ST elevation (NSTEMI) myocardial infarction: Secondary | ICD-10-CM

## 2022-03-10 ENCOUNTER — Encounter: Payer: Self-pay | Admitting: Interventional Cardiology

## 2022-03-10 DIAGNOSIS — I2510 Atherosclerotic heart disease of native coronary artery without angina pectoris: Secondary | ICD-10-CM

## 2022-03-12 NOTE — Addendum Note (Signed)
Addended by: Loren Racer on: 03/12/2022 08:27 AM ? ? Modules accepted: Orders ? ?

## 2022-03-14 ENCOUNTER — Other Ambulatory Visit: Payer: Self-pay | Admitting: *Deleted

## 2022-03-14 DIAGNOSIS — R0602 Shortness of breath: Secondary | ICD-10-CM

## 2022-03-14 MED ORDER — EMPAGLIFLOZIN 10 MG PO TABS: 10.0000 mg | ORAL_TABLET | Freq: Every day | ORAL | 11 refills | Status: DC

## 2022-03-14 NOTE — Addendum Note (Signed)
Addended by: Loren Racer on: 03/14/2022 03:04 PM ? ? Modules accepted: Orders ? ?

## 2022-03-15 ENCOUNTER — Other Ambulatory Visit: Payer: Self-pay | Admitting: Interventional Cardiology

## 2022-03-15 DIAGNOSIS — Z720 Tobacco use: Secondary | ICD-10-CM

## 2022-03-15 DIAGNOSIS — I739 Peripheral vascular disease, unspecified: Secondary | ICD-10-CM

## 2022-03-15 DIAGNOSIS — I2510 Atherosclerotic heart disease of native coronary artery without angina pectoris: Secondary | ICD-10-CM

## 2022-03-15 DIAGNOSIS — E7849 Other hyperlipidemia: Secondary | ICD-10-CM

## 2022-03-22 ENCOUNTER — Other Ambulatory Visit: Payer: PPO

## 2022-03-22 ENCOUNTER — Ambulatory Visit (HOSPITAL_COMMUNITY): Payer: PPO | Attending: Cardiology

## 2022-03-22 DIAGNOSIS — R0602 Shortness of breath: Secondary | ICD-10-CM

## 2022-03-22 DIAGNOSIS — E785 Hyperlipidemia, unspecified: Secondary | ICD-10-CM | POA: Diagnosis not present

## 2022-03-22 DIAGNOSIS — E876 Hypokalemia: Secondary | ICD-10-CM | POA: Diagnosis not present

## 2022-03-22 DIAGNOSIS — R030 Elevated blood-pressure reading, without diagnosis of hypertension: Secondary | ICD-10-CM | POA: Diagnosis not present

## 2022-03-22 DIAGNOSIS — I10 Essential (primary) hypertension: Secondary | ICD-10-CM

## 2022-03-22 DIAGNOSIS — E1169 Type 2 diabetes mellitus with other specified complication: Secondary | ICD-10-CM | POA: Diagnosis not present

## 2022-03-22 DIAGNOSIS — Z6833 Body mass index (BMI) 33.0-33.9, adult: Secondary | ICD-10-CM | POA: Diagnosis not present

## 2022-03-22 DIAGNOSIS — R319 Hematuria, unspecified: Secondary | ICD-10-CM | POA: Diagnosis not present

## 2022-03-22 DIAGNOSIS — D649 Anemia, unspecified: Secondary | ICD-10-CM | POA: Diagnosis not present

## 2022-03-22 DIAGNOSIS — I2510 Atherosclerotic heart disease of native coronary artery without angina pectoris: Secondary | ICD-10-CM

## 2022-03-22 DIAGNOSIS — R0989 Other specified symptoms and signs involving the circulatory and respiratory systems: Secondary | ICD-10-CM | POA: Diagnosis not present

## 2022-03-22 DIAGNOSIS — E6609 Other obesity due to excess calories: Secondary | ICD-10-CM | POA: Diagnosis not present

## 2022-03-22 DIAGNOSIS — N200 Calculus of kidney: Secondary | ICD-10-CM | POA: Diagnosis not present

## 2022-03-22 DIAGNOSIS — R195 Other fecal abnormalities: Secondary | ICD-10-CM | POA: Diagnosis not present

## 2022-03-22 LAB — ECHOCARDIOGRAM COMPLETE
Area-P 1/2: 3.39 cm2
S' Lateral: 3.5 cm

## 2022-03-23 ENCOUNTER — Telehealth: Payer: Self-pay | Admitting: Interventional Cardiology

## 2022-03-23 DIAGNOSIS — I10 Essential (primary) hypertension: Secondary | ICD-10-CM

## 2022-03-23 DIAGNOSIS — Z79899 Other long term (current) drug therapy: Secondary | ICD-10-CM

## 2022-03-23 LAB — BASIC METABOLIC PANEL
BUN/Creatinine Ratio: 10 (ref 10–24)
BUN: 13 mg/dL (ref 8–27)
CO2: 21 mmol/L (ref 20–29)
Calcium: 9.4 mg/dL (ref 8.6–10.2)
Chloride: 99 mmol/L (ref 96–106)
Creatinine, Ser: 1.24 mg/dL (ref 0.76–1.27)
Glucose: 150 mg/dL — ABNORMAL HIGH (ref 70–99)
Potassium: 3.3 mmol/L — ABNORMAL LOW (ref 3.5–5.2)
Sodium: 141 mmol/L (ref 134–144)
eGFR: 63 mL/min/{1.73_m2} (ref >59)

## 2022-03-23 LAB — PRO B NATRIURETIC PEPTIDE: NT-Pro BNP: 325 pg/mL (ref 0–376)

## 2022-03-23 NOTE — Telephone Encounter (Signed)
Patient called and made mention that PCP checked patient's neck and found an abnormality in his neck and wanted Dr. Tamala Julian and nurse to know ?

## 2022-03-23 NOTE — Telephone Encounter (Signed)
Attempted to contact patient, no answer, left message to return call. ?

## 2022-03-26 MED ORDER — POTASSIUM CHLORIDE ER 10 MEQ PO TBCR: 10.0000 meq | EXTENDED_RELEASE_TABLET | Freq: Every day | ORAL | 3 refills | Status: DC

## 2022-03-26 MED ORDER — SPIRONOLACTONE 25 MG PO TABS
12.5000 mg | ORAL_TABLET | Freq: Every day | ORAL | 3 refills | Status: DC
Start: 2022-03-26 — End: 2023-05-27

## 2022-03-26 NOTE — Telephone Encounter (Signed)
Per Patient: Found puffiness under left ear this morning. Tender to touch. Dr. Melene Plan for ENT. Advised patient to see his ENT for further evaluation. ?  ?Dr. Jacelyn Grip (PCP) -- right side of neck, carotid artery "just didn't feel right." This is what patient was told at his last visit. Unable to elaborate. ? ?Also discussed results of echo and labs. Per Dr. Tamala Julian: ?Let the patient know the potassium is low. Start Spironolactone 12.5 mg daily, decrease potassium to 10 meq daily. BMET 2 weeks. Heart echo suggest high pressure in heart. Adding a weak diuretic to see if breathing improves. Needs 4 week OV  ? ?Spironolactone 12.'5mg'$  QD sent to pharmacy of choice. BMET scheduled for 04/11/22. 4 week OV F/U scheduled for 04/26/22. ? ?Patient verbalized understanding. ? ?Will forward to Dr. Tamala Julian to review. ?

## 2022-03-26 NOTE — Telephone Encounter (Signed)
Found puffiness under left ear this morning. Tender to touch. Dr. Melene Plan for ENT.  ? ?Dr. Jacelyn Grip (PCP) -- right side of neck, carotid artery "just didn't feel right." ?

## 2022-03-26 NOTE — Telephone Encounter (Signed)
Pt returning nurse's call. Please advise

## 2022-03-27 ENCOUNTER — Encounter: Payer: Self-pay | Admitting: Interventional Cardiology

## 2022-03-27 NOTE — Telephone Encounter (Signed)
Patient wanted to let Dr. Tamala Julian know he is scheduled to see his ENT tomorrow (03/28/22) ?

## 2022-03-28 ENCOUNTER — Ambulatory Visit (HOSPITAL_COMMUNITY)
Admission: RE | Admit: 2022-03-28 | Discharge: 2022-03-28 | Disposition: A | Discharge status: Home / Self Care | Payer: PPO | Source: Ambulatory Visit | Attending: Cardiovascular Disease | Admitting: Cardiovascular Disease

## 2022-03-28 DIAGNOSIS — I739 Peripheral vascular disease, unspecified: Secondary | ICD-10-CM | POA: Diagnosis not present

## 2022-04-04 DIAGNOSIS — G4733 Obstructive sleep apnea (adult) (pediatric): Secondary | ICD-10-CM | POA: Diagnosis not present

## 2022-04-11 ENCOUNTER — Other Ambulatory Visit: Payer: PPO

## 2022-04-11 DIAGNOSIS — I10 Essential (primary) hypertension: Secondary | ICD-10-CM | POA: Diagnosis not present

## 2022-04-11 DIAGNOSIS — Z79899 Other long term (current) drug therapy: Secondary | ICD-10-CM

## 2022-04-12 LAB — BASIC METABOLIC PANEL
BUN/Creatinine Ratio: 15 (ref 10–24)
BUN: 16 mg/dL (ref 8–27)
CO2: 20 mmol/L (ref 20–29)
Calcium: 9.3 mg/dL (ref 8.6–10.2)
Chloride: 99 mmol/L (ref 96–106)
Creatinine, Ser: 1.06 mg/dL (ref 0.76–1.27)
Glucose: 156 mg/dL — ABNORMAL HIGH (ref 70–99)
Potassium: 3.7 mmol/L (ref 3.5–5.2)
Sodium: 138 mmol/L (ref 134–144)
eGFR: 76 mL/min/{1.73_m2} (ref >59)

## 2022-04-16 ENCOUNTER — Encounter: Payer: Self-pay | Admitting: Interventional Cardiology

## 2022-04-16 ENCOUNTER — Other Ambulatory Visit: Payer: Self-pay | Admitting: Interventional Cardiology

## 2022-04-16 DIAGNOSIS — E7849 Other hyperlipidemia: Secondary | ICD-10-CM

## 2022-04-16 DIAGNOSIS — I2510 Atherosclerotic heart disease of native coronary artery without angina pectoris: Secondary | ICD-10-CM

## 2022-04-16 DIAGNOSIS — Z720 Tobacco use: Secondary | ICD-10-CM

## 2022-04-24 DIAGNOSIS — H6521 Chronic serous otitis media, right ear: Secondary | ICD-10-CM | POA: Diagnosis not present

## 2022-04-24 DIAGNOSIS — H9011 Conductive hearing loss, unilateral, right ear, with unrestricted hearing on the contralateral side: Secondary | ICD-10-CM | POA: Diagnosis not present

## 2022-04-24 DIAGNOSIS — H6981 Other specified disorders of Eustachian tube, right ear: Secondary | ICD-10-CM | POA: Diagnosis not present

## 2022-04-24 DIAGNOSIS — J310 Chronic rhinitis: Secondary | ICD-10-CM | POA: Diagnosis not present

## 2022-04-24 DIAGNOSIS — J343 Hypertrophy of nasal turbinates: Secondary | ICD-10-CM | POA: Diagnosis not present

## 2022-04-25 NOTE — Progress Notes (Signed)
Cardiology Office Note:    Date:  04/26/2022   ID:  Joseph Fitzpatrick, DOB [birth year], MRN [id number]  PCP:  Leighton Ruff, MD (Inactive)  Cardiologist:  Sinclair Grooms, MD   Referring MD: No ref. provider found   Chief Complaint  Patient presents with   Hyperlipidemia   Hypertension   Shortness of Breath    History of Present Illness:    Joseph Fitzpatrick is a 71 y.o. male with a hx of CAD with prior stent to the LCX, prior NSTEMI in 2016 with OM1 felt to be the culprit vessel and this was successfully treated with a 2.25 x 32 mm Promus Premier drug-eluting stent.  Fractional flow reserve was measured across the RCA lesion and was normal at 0.93. His other issues include HTN, depression, tobacco abuse and HLD. He did have an abdominal aortogram in 06/2017 for claudication - found to have minimal disease in the aortoiliac arterial system and 50% stenosis in distal superfical femoral artery.    He is happy blood pressure is better.  He still has significant increase in heart rate with minimal activity.  No chest discomfort.  We reviewed the results of his echocardiogram which demonstrated diastolic dysfunction but preserved systolic function.  No significant structural abnormalities other than LVH.  Lower extremity exertional pain is not worsened.  The lower extremity Doppler study did not demonstrate significant obstructive disease although he had noncompressible arteries.  Past Medical History:  Diagnosis Date   Anxiety    Borderline diabetes    a. 08/2015 HbA1c = 5.9.   BPH (benign prostatic hypertrophy)    CAD (coronary artery disease)    a. 08/2015 NSTEMI/PCI: LM nl, LAD nl, RI nl, LCX 20p, OM1 95 (2.25x32 Pomus Premier DES), RCA 42m(FFR 0.93)->Med Rx, EF 55-65%.   Depression    Essential hypertension    GERD (gastroesophageal reflux disease)    Hematuria    History of duodenal ulcer    History of hiatal hernia    "suspected; never confirmed"   History of  nephrolithiasis    History of pneumonia 1993 X 1   History of substance abuse ([place]    Hypercholesterolemia    Hypokalemia    IBS (irritable bowel syndrome)    PVCs (premature ventricular contractions)    Tobacco abuse     Past Surgical History:  Procedure Laterality Date   ABDOMINAL AORTOGRAM W/LOWER EXTREMITY N/A 07/18/2017   Procedure: ABDOMINAL AORTOGRAM W/LOWER EXTREMITY;  Surgeon: CConrad [place] MD;  [facility];  Service: Cardiovascular;  Laterality: N/A;   CARDIAC CATHETERIZATION N/A 09/16/2015   Procedure: Left Heart Cath and Coronary Angiography;  Surgeon: Peter M JMartinique MD;  [facility];  Service: Cardiovascular;  Laterality: N/A;   CARDIAC CATHETERIZATION  09/16/2015   Procedure: Intravascular Pressure Wire/FFR Study;  Surgeon: Peter M JMartinique MD;  [facility];  Service: Cardiovascular;;   CARDIAC CATHETERIZATION  09/16/2015   Procedure: Coronary Stent Intervention;  Surgeon: Peter M JMartinique MD;  [facility];  Service: Cardiovascular;;   CHOLECYSTECTOMY OPEN     TONSILLECTOMY      Current Medications: Current Meds  Medication Sig   acetaminophen (TYLENOL) 325 MG tablet Take 2 tablets by mouth as needed.   amLODipine (NORVASC) 5 MG tablet Take 1 tablet (5 mg total) by mouth daily.   aspirin EC 81 MG EC tablet Take 1 tablet (81 mg total) by mouth daily.  atorvastatin (LIPITOR) 80 MG tablet Take 1 tablet (80 mg total) by mouth daily.   clonazePAM (KLONOPIN) 1 MG tablet Take 1 mg by mouth 3 (three) times daily as needed for anxiety.   empagliflozin (JARDIANCE) 10 MG TABS tablet Take 1 tablet (10 mg total) by mouth daily before breakfast.   fluticasone (FLONASE) 50 MCG/ACT nasal spray Place 2 sprays into both nostrils daily.   hydrOXYzine (VISTARIL) 25 MG capsule Take 25-75 mg by mouth 3 (three) times daily. Take 1 capsule ('25mg'$ ) at 0800,2 capsules ('50mg'$ ) at 1400, and 3 capsules (75 mg) at 2000.    Loratadine 10 MG CAPS Take 10 mg by mouth daily as needed (for allergies.).    losartan-hydrochlorothiazide (HYZAAR) 50-12.5 MG per tablet Take 1 tablet by mouth daily. (0800)   Menthol-Zinc Oxide (GOLD BOND EX) Apply 1 application topically daily.   metFORMIN (GLUCOPHAGE-XR) 500 MG 24 hr tablet 500 mg daily.   metoprolol succinate (TOPROL XL) 25 MG 24 hr tablet Take 1 tablet (25 mg total) by mouth daily.   nitrofurantoin, macrocrystal-monohydrate, (MACROBID) 100 MG capsule 100 mg daily.   nitroGLYCERIN (NITROSTAT) 0.4 MG SL tablet Place 1 tablet (0.4 mg total) under the tongue every 5 (five) minutes x 3 doses as needed for chest pain.   Omega-3 Fatty Acids (FISH OIL) 1200 MG CAPS Take 1,200 mg by mouth 2 (two) times daily. (0800 & 2000)   pantoprazole (PROTONIX) 40 MG tablet Take 40 mg by mouth 2 (two) times daily.   PARoxetine (PAXIL) 20 MG tablet Take 20 mg by mouth daily. Taking with '40mg'$  to make '60mg'$  daily   PARoxetine (PAXIL) 40 MG tablet Take 40 mg by mouth daily. (0800) Taking with '20mg'$  to make '60mg'$  daily   potassium chloride (KLOR-CON) 10 MEQ tablet Take 1 tablet (10 mEq total) by mouth daily.   Probiotic TBEC Take 1 capsule by mouth daily.   spironolactone (ALDACTONE) 25 MG tablet Take 0.5 tablets (12.5 mg total) by mouth daily.   sucralfate (CARAFATE) 1 g tablet Take 1 g by mouth 3 (three) times daily.   tamsulosin (FLOMAX) 0.4 MG CAPS capsule Take 0.4 mg by mouth at bedtime.   TURMERIC PO Take 1 capsule by mouth 3 (three) times daily as needed (for inflammation.).   vitamin B-12 (CYANOCOBALAMIN) 1000 MCG tablet Take 1,000 mcg by mouth daily. (0800)     Allergies:   Ibuprofen and Nsaids   Social History   Socioeconomic History   Marital status: Married    Spouse name: Not on file   Number of children: Not on file   Years of education: Not on file   Highest education level: Not on file  Occupational History   Not on file  Tobacco Use   Smoking status: Former    Packs/day:  0.25    Years: 47.00    Pack years: 11.75    Types: Cigarettes    Start date: 02/20/1972    Quit date: 07/30/2018    Years since quitting: 3.7   Smokeless tobacco: Never  Vaping Use   Vaping Use: Never used  Substance and Sexual Activity   Alcohol use: Yes    Alcohol/week: 0.0 standard drinks    Comment: Hx of EtOH abuse, quit in 1982   Drug use: Yes    Types: Marijuana    Comment: "smoked marijuana 40 years quit in ~ 2014, did a little of qthing in the 1970's for ~ 3 yrs"   Sexual activity: Not Currently  Other Topics  Concern   Not on file  Social History Narrative   Lives with his wife and 3 year old son.  Normally ambulates without assistive device, drives, completes ADLs without difficulty.     Social Determinants of Health   Financial Resource Strain: Not on file  Food Insecurity: Not on file  Transportation Needs: Not on file  Physical Activity: Not on file  Stress: Not on file  Social Connections: Not on file     Family History: The patient's family history includes Alzheimer's disease in his mother; Anxiety disorder in his mother; CAD in his father; Depression in his father; High blood pressure in his mother.  ROS:   Please see the history of present illness.    Hearing is better in his right ear after seeing Dr. Melene Plan.  Flonase nasal spray is helping.  All other systems reviewed and are negative.  EKGs/Labs/Other Studies Reviewed:    The following studies were reviewed today:  2 D Doppler ECHOCARDIOGRAM 03/22/2022: IMPRESSIONS   1. Left ventricular ejection fraction, by estimation, is 55%. The left  ventricle has normal function. The left ventricle has no regional wall  motion abnormalities. There is mild left ventricular hypertrophy. Left  ventricular diastolic parameters are  consistent with Grade I diastolic dysfunction (impaired relaxation).   2. Right ventricular systolic function is normal. The right ventricular  size is normal. Tricuspid regurgitation  signal is inadequate for assessing  PA pressure.   3. The mitral valve is normal in structure. No evidence of mitral valve  regurgitation. No evidence of mitral stenosis.   4. The aortic valve is tricuspid. There is mild calcification of the  aortic valve. Aortic valve regurgitation is not visualized. Aortic valve  sclerosis/calcification is present, without any evidence of aortic  stenosis.   5. The inferior vena cava is normal in size with greater than 50%  respiratory variability, suggesting right atrial pressure of 3 mmHg.   Bilateral Lower extremity doppler 03/2022: Summary:  Right: Resting right ankle-brachial index indicates noncompressible right  lower extremity arteries. The right toe-brachial index is normal.   Left: Resting left ankle-brachial index indicates noncompressible left  lower extremity arteries. The left toe-brachial index is normal.   Tibial waveforms demonstrate brisk, multiphasic flow. Essentially stable  peripheral vascular disease.   2018 lower extremity arterial duplex: 50 to 74% mid to distal left femoral artery stenosis based on Doppler velocity 50 to 74% mid to distal right femoral artery stenosis based on Doppler velocity.     EKG:  EKG not repeated  Recent Labs: 03/22/2022: NT-Pro BNP 325 04/11/2022: BUN 16; Creatinine, Ser 1.06; Potassium 3.7; Sodium 138  Recent Lipid Panel    Component Value Date/Time   CHOL 88 (L) 11/15/2015 0857   TRIG 54 11/15/2015 0857   HDL 41 11/15/2015 0857   CHOLHDL 2.1 11/15/2015 0857   VLDL 11 11/15/2015 0857   LDLCALC 36 11/15/2015 0857    Physical Exam:    VS:  BP 130/68   Pulse 95   Ht '5\' 10"'$  (1.778 m)   Wt 224 lb 12.8 oz (102 kg)   SpO2 94%   BMI 32.26 kg/m     Wt Readings from Last 3 Encounters:  04/26/22 224 lb 12.8 oz (102 kg)  03/06/22 232 lb 2 oz (105.3 kg)  02/02/20 210 lb (95.3 kg)     GEN: Weight is down 9 pounds since starting spironolactone. No acute distress HEENT: Normal NECK: No  JVD. LYMPHATICS: No lymphadenopathy CARDIAC: No murmur. RRR no  gallop, or edema. VASCULAR:  Normal Pulses. No bruits. RESPIRATORY:  Clear to auscultation without rales, wheezing or rhonchi  ABDOMEN: Soft, non-tender, non-distended, No pulsatile mass, MUSCULOSKELETAL: No deformity  SKIN: Warm and dry NEUROLOGIC:  Alert and oriented x 3 PSYCHIATRIC:  Normal affect   ASSESSMENT:    1. Chronic diastolic HF (heart failure) ([place])   2. Primary hypertension   3. Coronary artery disease involving native coronary artery of native heart without angina pectoris   4. PAD (peripheral artery disease) ([place])   5. Hypercholesterolemia    PLAN:    In order of problems listed above:  No evidence of volume overload on exam.  Continue Jardiance and spironolactone. Blood pressure is better controlled on current regimen.  Spironolactone is helped.  Still complains that his heart rate increases disproportionately with physical activity.  I repeated the blood pressure and got 140/72 mmHg.  Start Toprol-XL 25 mg/day. Continue statin therapy. Encouraged walking.  Doppler study showed noncompressible vessels.  May need to repeat the duplex scan at some point. Continue high intensity statin therapy.   Clinical follow-up in 6 months.  Earlier if worsening dyspnea or other complaints such as claudication.   Medication Adjustments/Labs and Tests Ordered: Current medicines are reviewed at length with the patient today.  Concerns regarding medicines are outlined above.  No orders of the defined types were placed in this encounter.  Meds ordered this encounter  Medications   metoprolol succinate (TOPROL XL) 25 MG 24 hr tablet    Sig: Take 1 tablet (25 mg total) by mouth daily.    Dispense:  90 tablet    Refill:  3    Patient Instructions  Medication Instructions:  Your physician has recommended you make the following change in your medication:   1) START Metoprolol succinate '25mg'$  daily *If you need a  refill on your cardiac medications before your next appointment, please call your pharmacy*  Lab Work: NONE  Testing/Procedures: NONE  Follow-Up: At [facility], you and your health needs are our priority.  As part of our continuing mission to provide you with exceptional heart care, we have created designated Provider Care Teams.  These Care Teams include your primary Cardiologist (physician) and Advanced Practice Providers (APPs -  Physician Assistants and Nurse Practitioners) who all work together to provide you with the care you need, when you need it.  Your next appointment:   6 month(s)  The format for your next appointment:   In Person  Provider:   Sinclair Grooms, MD {   Important Information About Sugar         Signed, Sinclair Grooms, MD  04/26/2022 2:22 PM    [place]

## 2022-04-26 ENCOUNTER — Encounter: Payer: Self-pay | Admitting: Interventional Cardiology

## 2022-04-26 ENCOUNTER — Ambulatory Visit: Payer: PPO | Admitting: Interventional Cardiology

## 2022-04-26 VITALS — BP 130/68 | HR 95 | Ht 70.0 in | Wt 224.8 lb

## 2022-04-26 DIAGNOSIS — I10 Essential (primary) hypertension: Secondary | ICD-10-CM | POA: Diagnosis not present

## 2022-04-26 DIAGNOSIS — E7800 Pure hypercholesterolemia, unspecified: Secondary | ICD-10-CM | POA: Diagnosis not present

## 2022-04-26 DIAGNOSIS — I5032 Chronic diastolic (congestive) heart failure: Secondary | ICD-10-CM

## 2022-04-26 DIAGNOSIS — I739 Peripheral vascular disease, unspecified: Secondary | ICD-10-CM | POA: Diagnosis not present

## 2022-04-26 DIAGNOSIS — I2510 Atherosclerotic heart disease of native coronary artery without angina pectoris: Secondary | ICD-10-CM | POA: Diagnosis not present

## 2022-04-26 MED ORDER — METOPROLOL SUCCINATE ER 25 MG PO TB24
25.0000 mg | ORAL_TABLET | Freq: Every day | ORAL | 3 refills | Status: DC
Start: 2022-04-26 — End: 2022-10-30

## 2022-04-26 NOTE — Patient Instructions (Signed)
Medication Instructions:  Your physician has recommended you make the following change in your medication:   1) START Metoprolol succinate '25mg'$  daily *If you need a refill on your cardiac medications before your next appointment, please call your pharmacy*  Lab Work: NONE  Testing/Procedures: NONE  Follow-Up: At [facility], you and your health needs are our priority.  As part of our continuing mission to provide you with exceptional heart care, we have created designated Provider Care Teams.  These Care Teams include your primary Cardiologist (physician) and Advanced Practice Providers (APPs -  Physician Assistants and Nurse Practitioners) who all work together to provide you with the care you need, when you need it.  Your next appointment:   6 month(s)  The format for your next appointment:   In Person  Provider:   Sinclair Grooms, MD {   Important Information About Sugar

## 2022-05-18 ENCOUNTER — Telehealth: Payer: Self-pay | Admitting: Interventional Cardiology

## 2022-05-18 NOTE — Telephone Encounter (Signed)
Patient called to let us know his PCP Dr. Modesto Charon has retired and he is currently without a PCP. He states he will be looking for a new provider and for [place] to be aware for when they request medical records from our office.  Thanked patient for the update.

## 2022-05-19 DIAGNOSIS — M7041 Prepatellar bursitis, right knee: Secondary | ICD-10-CM | POA: Diagnosis not present

## 2022-05-21 DIAGNOSIS — E119 Type 2 diabetes mellitus without complications: Secondary | ICD-10-CM | POA: Diagnosis not present

## 2022-07-17 ENCOUNTER — Other Ambulatory Visit: Payer: Self-pay | Admitting: Interventional Cardiology

## 2022-07-17 DIAGNOSIS — Z720 Tobacco use: Secondary | ICD-10-CM

## 2022-07-17 DIAGNOSIS — E7849 Other hyperlipidemia: Secondary | ICD-10-CM

## 2022-07-17 DIAGNOSIS — I2510 Atherosclerotic heart disease of native coronary artery without angina pectoris: Secondary | ICD-10-CM

## 2022-09-05 ENCOUNTER — Encounter: Payer: Self-pay | Admitting: Cardiovascular Disease

## 2022-09-05 ENCOUNTER — Ambulatory Visit: Payer: PPO | Attending: Cardiovascular Disease | Admitting: Cardiovascular Disease

## 2022-09-05 VITALS — BP 140/76 | HR 68 | Ht 70.0 in | Wt 235.8 lb

## 2022-09-05 DIAGNOSIS — I10 Essential (primary) hypertension: Secondary | ICD-10-CM | POA: Diagnosis not present

## 2022-09-05 DIAGNOSIS — E118 Type 2 diabetes mellitus with unspecified complications: Secondary | ICD-10-CM | POA: Diagnosis not present

## 2022-09-05 DIAGNOSIS — I2510 Atherosclerotic heart disease of native coronary artery without angina pectoris: Secondary | ICD-10-CM | POA: Diagnosis not present

## 2022-09-05 DIAGNOSIS — G4733 Obstructive sleep apnea (adult) (pediatric): Secondary | ICD-10-CM | POA: Diagnosis not present

## 2022-09-05 DIAGNOSIS — E785 Hyperlipidemia, unspecified: Secondary | ICD-10-CM | POA: Diagnosis not present

## 2022-09-05 DIAGNOSIS — F419 Anxiety disorder, unspecified: Secondary | ICD-10-CM

## 2022-09-05 DIAGNOSIS — I214 Non-ST elevation (NSTEMI) myocardial infarction: Secondary | ICD-10-CM

## 2022-09-05 DIAGNOSIS — F32A Depression, unspecified: Secondary | ICD-10-CM

## 2022-09-05 NOTE — Patient Instructions (Signed)
Medication Instructions:  Continue same medications *If you need a refill on your cardiac medications before your next appointment, please call your pharmacy*   Lab Work: None ordered   Testing/Procedures: None ordered   Follow-Up: At [facility], you and your health needs are our priority.  As part of our continuing mission to provide you with exceptional heart care, we have created designated Provider Care Teams.  These Care Teams include your primary Cardiologist (physician) and Advanced Practice Providers (APPs -  Physician Assistants and Nurse Practitioners) who all work together to provide you with the care you need, when you need it.  We recommend signing up for the patient portal called "MyChart".  Sign up information is provided on this After Visit Summary.  MyChart is used to connect with patients for Virtual Visits (Telemedicine).  Patients are able to view lab/test results, encounter notes, upcoming appointments, etc.  Non-urgent messages can be sent to your provider as well.   To learn more about what you can do with MyChart, go to [email or web address].    Your next appointment:  3 to 4 months after new cpap machine    The format for your next appointment: Office   Provider:  Claiborne Billings   Important Information About Sugar

## 2022-09-05 NOTE — Progress Notes (Addendum)
Cardiology Office Note    Date:  09/10/2022   ID:  Joseph Fitzpatrick, DOB November 01, 1951, MRN [id number]  PCP:  Leighton Ruff, MD (Inactive)  Cardiologist:  Shelva Majestic, MD (sleep); Dr. Daneen Schick   Sleep consult/evaluation, last seen in 2017   History of Present Illness:  Joseph Fitzpatrick is a 71 y.o. male who is followed by Dr. Tamala Julian for cardiology care.  I had seen him in November 2017 for initial sleep evaluation.  He is now referred for new sleep evaluation stating that his machine is now making noises and is in need a new CPAP device.  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  When seen by Dr. Tamala Julian due to concerns for obstructive sleep apnea he was referred for a sleep study.  He underwent a diagnostic polysomnogram on 05/07/2016 which revealed mild obstructive sleep apnea overall with an AHI of 12.8 per hour; however, events were very severe with supine position with an HI of 98.8.  There was oxygen desaturated to a nadir of 85%.  He subsequent underwent a CPAP titration trial on 06/28/2016 and an initial pressure.  Recommendation was 13 cm water pressure.  He has [place] [facility] at his [employer] as his DME provider.  His CPAP set up date was 08/13/2016.  He has a ResMed AirFit F20 medium size mask.  A download from 09/03/2016 through 10/02/2016  that he is meeting compliance with 87% of usage stays and 73% of usage days greater than 4 hours.  His AHI at 13 cm, however, was still slightly increased at 6.8.  Due to an apneic index of 6.0, and hypopnea index of 0.8.   When I saw for my initial evaluation he denied any residual sleepiness, breakthrough snoring, and admitted that his sleep was much more restorative.  . An Epworth Sleepiness Scale score was calculated  and endorsed at 5, arguing against residual daytime sleepiness.  I have not seen Joseph Fitzpatrick since 2017.  He continues to use his same sheen.  Recently, his machine has begun to make high-pitched noises.  Presently, he goes to bed between midnight and 1 AM and often wakes up due to vivid dreams around 3 and 4 AM and often may stay awake until 8 and then sleeps for an additional 3 to 4 hours.  A download was obtained from September 11 through September 04, 2022.  He uses CPAP daily and due to him keeping his mask on for his entire duration it appears that he is wearing the mask for 11 hours and 40 minutes but this is not reflective of his sleep time since he believes he stays awake from 4 AM until 8 AM until he falls back to sleep.  His CPAP unit is set at a pressure range of 8 to 20 cm of water and AHI is 2.4/h with 95 percentile pressure 12.9 with maximum average pressure of 15.4.  He is now referred for new sleep evaluation.  An Epworth Sleepiness Scale score was calculated in the office today and this endorsed at 7 as shown below:  Epworth Sleepiness Scale: Situation   Chance of Dozing/Sleeping (0 = never , 1 = slight chance , 2 = moderate chance , 3 = high chance )   sitting and reading 3  watching TV 3   sitting inactive in a public place 0   being a passenger in a motor vehicle for an hour or more 0   lying down in the afternoon 1   sitting and talking to someone 0   sitting quietly after lunch (no alcohol) 0   while stopped for a few minutes in traffic as the driver 0   Total Score  7    He denies any restless legs, bruxism, hypnopompic or hypnagogic hallucinations or cataplectic events.  He presents for evaluation.  Past Medical History:  Diagnosis Date   Anxiety    Borderline diabetes    a. 08/2015 HbA1c = 5.9.   BPH (benign prostatic hypertrophy)    CAD (coronary artery disease)    a. 08/2015 NSTEMI/PCI: LM nl, LAD nl, RI nl, LCX 20p, OM1 95 (2.25x32 [employer] Premier DES), RCA 35m (FFR 0.93)->Med Rx, EF 55-65%.   Depression    Essential hypertension    GERD (gastroesophageal reflux disease)    Hematuria    History of duodenal ulcer    History of hiatal hernia    "suspected; never confirmed"   History of nephrolithiasis    History of pneumonia 1993 X 1   History of substance abuse ([place]    Hypercholesterolemia    Hypokalemia    IBS (irritable bowel syndrome)    PVCs (premature ventricular contractions)    Tobacco abuse     Past Surgical History:  Procedure Laterality Date   ABDOMINAL AORTOGRAM W/LOWER EXTREMITY N/A 07/18/2017   Procedure: ABDOMINAL AORTOGRAM W/LOWER EXTREMITY;  Surgeon: CConrad Mekoryuk MD;  [facility];  Service: Cardiovascular;  Laterality: N/A;   CARDIAC CATHETERIZATION N/A 09/16/2015   Procedure: Left Heart Cath and Coronary Angiography;  Surgeon: Peter M JMartinique MD;  [facility];  Service: Cardiovascular;  Laterality: N/A;   CARDIAC CATHETERIZATION  09/16/2015   Procedure: Intravascular Pressure Wire/FFR Study;  Surgeon: Peter M JMartinique MD;  [facility];  Service: Cardiovascular;;   CARDIAC CATHETERIZATION  09/16/2015   Procedure: Coronary Stent Intervention;  Surgeon: Peter M JMartinique MD;  [facility];  Service: Cardiovascular;;   CHOLECYSTECTOMY OPEN     TONSILLECTOMY      Current Medications: Outpatient Medications Prior to Visit  Medication Sig Dispense Refill   acetaminophen (TYLENOL) 325 MG tablet Take 2 tablets by mouth as needed.     amLODipine (NORVASC) 5 MG tablet TAKE 1 TABLET (5 MG TOTAL) BY MOUTH DAILY. 90 tablet 3   aspirin EC 81 MG EC tablet Take 1 tablet (81 mg total) by mouth daily.     atorvastatin (LIPITOR) 80 MG tablet Take 1 tablet (80 mg total) by mouth daily. 90 tablet 3   clonazePAM (KLONOPIN) 1 MG tablet Take 1 mg by mouth 3 (three) times daily as needed for anxiety.     empagliflozin (JARDIANCE) 10 MG TABS tablet Take 1 tablet (10 mg total) by  mouth daily before breakfast. 30 tablet 11   hydrOXYzine (VISTARIL) 25 MG capsule Take 25-75 mg by mouth 3 (three) times daily. Take 1 capsule (279m at 0800,2 capsules (5073mat 1400, and 3 capsules (75 mg) at 2000.     Loratadine 10 MG CAPS Take 10 mg by mouth daily as needed (for allergies.).      losartan-hydrochlorothiazide (HYZAAR) 50-12.5 MG per tablet Take 1 tablet by mouth daily. (0800)     metFORMIN (GLUCOPHAGE-XR) 500 MG 24 hr tablet  500 mg daily.     metoprolol succinate (TOPROL XL) 25 MG 24 hr tablet Take 1 tablet (25 mg total) by mouth daily. 90 tablet 3   Multiple Vitamins-Minerals (VITAMIN D3 COMPLETE PO) Take by mouth.     nitroGLYCERIN (NITROSTAT) 0.4 MG SL tablet Place 1 tablet (0.4 mg total) under the tongue every 5 (five) minutes x 3 doses as needed for chest pain. 25 tablet 3   Omega-3 Fatty Acids (FISH OIL) 1200 MG CAPS Take 1,200 mg by mouth 2 (two) times daily. (0800 & 2000)     pantoprazole (PROTONIX) 40 MG tablet Take 40 mg by mouth 2 (two) times daily.     PARoxetine (PAXIL) 20 MG tablet Take 20 mg by mouth daily. Taking with 46m to make 62mdaily     PARoxetine (PAXIL) 40 MG tablet Take 40 mg by mouth daily. (0800) Taking with 209mo make 8m56mily  11   potassium chloride (KLOR-CON) 10 MEQ tablet Take 1 tablet (10 mEq total) by mouth daily. 90 tablet 3   Probiotic TBEC Take 1 capsule by mouth daily.     spironolactone (ALDACTONE) 25 MG tablet Take 0.5 tablets (12.5 mg total) by mouth daily. 45 tablet 3   sucralfate (CARAFATE) 1 g tablet Take 1 g by mouth 3 (three) times daily.     tamsulosin (FLOMAX) 0.4 MG CAPS capsule Take 0.4 mg by mouth at bedtime.     TURMERIC PO Take 1 capsule by mouth 3 (three) times daily as needed (for inflammation.).     vitamin B-12 (CYANOCOBALAMIN) 1000 MCG tablet Take 1,000 mcg by mouth daily. (0800)     fluticasone (FLONASE) 50 MCG/ACT nasal spray Place 2 sprays into both nostrils daily. (Patient not taking: Reported on 09/05/2022)      Menthol-Zinc Oxide (GOLD BOND EX) Apply 1 application topically daily. (Patient not taking: Reported on 09/05/2022)     nitrofurantoin, macrocrystal-monohydrate, (MACROBID) 100 MG capsule 100 mg daily. (Patient not taking: Reported on 09/05/2022)     No facility-administered medications prior to visit.     Allergies:   Ibuprofen and Nsaids   Social History   Socioeconomic History   Marital status: Married    Spouse name: Not on file   Number of children: Not on file   Years of education: Not on file   Highest education level: Not on file  Occupational History   Not on file  Tobacco Use   Smoking status: Former    Packs/day: 0.25    Years: 47.00    Total pack years: 11.75    Types: Cigarettes    Start date: 02/20/1972    Quit date: 07/30/2018    Years since quitting: 4.1   Smokeless tobacco: Never  Vaping Use   Vaping Use: Never used  Substance and Sexual Activity   Alcohol use: Yes    Alcohol/week: 0.0 standard drinks of alcohol    Comment: Hx of EtOH abuse, quit in 1982   Drug use: Yes    Types: Marijuana    Comment: "smoked marijuana 40 years quit in ~ 2014, did a little of qthing in the 1970's for ~ 3 yrs"   Sexual activity: Not Currently  Other Topics Concern   Not on file  Social History Narrative   Lives with his wife and 17 y51r old son.  Normally ambulates without assistive device, drives, completes ADLs without difficulty.     Social Determinants of Health   Financial Resource Strain: Not on file  Food  Insecurity: Not on file  Transportation Needs: Not on file  Physical Activity: Not on file  Stress: Not on file  Social Connections: Not on file    Socially he has been married for 36 years.  He has 2 children.  He had smoked 2 packs of cigarettes per day for 40 years but quit smoking in 2016.  He does vape.  He gets very little exercise  Family History:  The patient's family history includes Alzheimer's disease in his mother; Anxiety disorder in his  mother; CAD in his father; Depression in his father; High blood pressure in his mother.  His mother died at age 2 and had dementia.  His father died at age 4 and had 2 stents placed in the 52s.  He has a daughter Chrys Racer age 47 and a son Saralyn Pilar age 45.  ROS General: Negative; No fevers, chills, or night sweats;  HEENT: Negative; No changes in vision or hearing, sinus congestion, difficulty swallowing Pulmonary: Negative; No cough, wheezing, shortness of breath, hemoptysis Cardiovascular: Hypertension, CAD status post stenting LCx. GI: GERD GU: Negative; No dysuria, hematuria, or difficulty voiding Musculoskeletal: Negative; no myalgias, joint pain, or weakness Hematologic/Oncology: Negative; no easy bruising, bleeding Endocrine: Negative; no heat/cold intolerance; no diabetes Neuro: Negative; no changes in balance, headaches Skin: Negative; No rashes or skin lesions Psychiatric: Anxiety/depression on paroxetine and clonazepam. Sleep: See HPI Other comprehensive 14 point system review is negative.   PHYSICAL EXAM:   VS:  BP (!) 140/76   Pulse 68   Ht '5\' 10"'  (1.778 m)   Wt 235 lb 12.8 oz (107 kg)   SpO2 97%   BMI 33.83 kg/m     Repeat blood pressure by me was 130/78.  Wt Readings from Last 3 Encounters:  09/05/22 235 lb 12.8 oz (107 kg)  04/26/22 224 lb 12.8 oz (102 kg)  03/06/22 232 lb 2 oz (105.3 kg)    General: Alert, oriented, no distress.  Mild obesity Skin: normal turgor, no rashes, warm and dry; facial beard HEENT: Normocephalic, atraumatic. Pupils equal round and reactive to light; sclera anicteric; extraocular muscles intact;  Nose without nasal septal hypertrophy Mouth/Parynx benign; Mallinpatti scale 3/4 Neck: No JVD, no carotid bruits; normal carotid upstroke Lungs: clear to ausculatation and percussion; no wheezing or rales Chest wall: without tenderness to palpitation Heart: PMI not displaced, RRR, s1 s2 normal, 1/6 systolic murmur, no diastolic murmur,  no rubs, gallops, thrills, or heaves Abdomen: soft, nontender; no hepatosplenomehaly, BS+; abdominal aorta nontender and not dilated by palpation. Back: no CVA tenderness Pulses 2+ Musculoskeletal: full range of motion, normal strength, no joint deformities Extremities: no clubbing cyanosis or edema, Homan's sign negative  Neurologic: grossly nonfocal; Cranial nerves grossly wnl Psychologic: Normal mood and affect   Studies/Labs Reviewed:    September 05, 2022 ECG (independently read by me):NSR at 68, QS V1-2, QTc 467   Recent Labs:    Latest Ref Rng & Units 04/11/2022    1:32 PM 03/22/2022    1:01 PM 09/29/2018   11:07 AM  BMP  Glucose 70 - 99 mg/dL 156  150  108   BUN 8 - 27 mg/dL '16  13  13   ' Creatinine 0.76 - 1.27 mg/dL 1.06  1.24  1.14   BUN/Creat Ratio 10 - '24 15  10  11   ' Sodium 134 - 144 mmol/L 138  141  140   Potassium 3.5 - 5.2 mmol/L 3.7  3.3  3.4   Chloride 96 -  106 mmol/L 99  99  100   CO2 20 - 29 mmol/L '20  21  23   ' Calcium 8.6 - 10.2 mg/dL 9.3  9.4  9.3         Latest Ref Rng & Units 11/15/2015    8:57 AM 02/20/2013    5:00 AM 02/19/2013    1:35 PM  Hepatic Function  Total Protein 6.1 - 8.1 g/dL 7.4  7.0  8.4   Albumin 3.6 - 5.1 g/dL 4.4  3.4  4.3   AST 10 - 35 U/L '17  16  17   ' ALT 9 - 46 U/L '17  18  25   ' Alk Phosphatase 40 - 115 U/L 58  60  74   Total Bilirubin 0.2 - 1.2 mg/dL 0.4  0.5  0.4   Bilirubin, Direct <=0.2 mg/dL 0.2          Latest Ref Rng & Units 09/29/2018   11:07 AM 07/18/2017   10:45 AM 11/15/2015    8:57 AM  CBC  WBC 3.4 - 10.8 x10E3/uL 7.5   8.6   Hemoglobin 13.0 - 17.7 g/dL 13.4  13.3  13.9   Hematocrit 37.5 - 51.0 % 42.0  39.0  40.5   Platelets 150 - 450 x10E3/uL 234   225    Lab Results  Component Value Date   MCV 92 09/29/2018   MCV 90.4 11/15/2015   MCV 89.7 09/23/2015   Lab Results  Component Value Date   TSH 1.899 02/19/2013   Lab Results  Component Value Date   HGBA1C 5.9 (H) 09/16/2015     BNP No results  found for: "BNP"  ProBNP    Component Value Date/Time   PROBNP 325 03/22/2022 1301     Lipid Panel     Component Value Date/Time   CHOL 88 (L) 11/15/2015 0857   TRIG 54 11/15/2015 0857   HDL 41 11/15/2015 0857   CHOLHDL 2.1 11/15/2015 0857   VLDL 11 11/15/2015 0857   LDLCALC 36 11/15/2015 0857     RADIOLOGY: No results found.   Additional studies/ records that were reviewed today include:    Patient Name: Joseph Fitzpatrick, Joseph Fitzpatrick Date: 05/07/2016 Gender: Male D.O.B: 1951-10-03 Age (years): 64 Referring Provider: Daneen Schick Height (inches): 71 Interpreting Physician: Shelva Majestic MD, ABSM Weight (lbs): 205 RPSGT: Carolin Coy BMI: 29 MRN: [id number] Neck Size: 17.00   CLINICAL INFORMATION Sleep Study Type: NPSG Indication for sleep study: Diabetes, Excessive Daytime Sleepiness, Hypertension, OSA, Snoring Epworth Sleepiness Score: 9   SLEEP STUDY TECHNIQUE As per the AASM Manual for the Scoring of Sleep and Associated Events v2.3 (April 2016) with a hypopnea requiring 4% desaturations. The channels recorded and monitored were frontal, central and occipital EEG, electrooculogram (EOG), submentalis EMG (chin), nasal and oral airflow, thoracic and abdominal wall motion, anterior tibialis EMG, snore microphone, electrocardiogram, and pulse oximetry.   MEDICATIONS  vitamin B-12 (CYANOCOBALAMIN) 100 MCG tablet 100 mcg, Daily     ticagrelor (BRILINTA) 90 MG TABS tablet 90 mg, 2 times daily     saw palmetto 160 MG capsule 160 mg, 3 times daily     PARoxetine (PAXIL) 40 MG tablet 40 mg, Every evening     Note: Received from: External Pharmacy (Written 09/15/2015 1125)   Omega-3 Fatty Acids (FISH OIL) 1000 MG CAPS 2 capsule, Daily     nitroGLYCERIN (NITROSTAT) 0.4 MG SL tablet 0.4 mg, Every 5 min x3 PRN     losartan-hydrochlorothiazide (HYZAAR) 50-12.5 MG per  tablet 1 tablet, Daily     Note: Received from: External Pharmacy (Written 01/05/2014 1434)   Loratadine  (CLARITIN) 10 MG CAPS 10 mg, Daily     KLOR-CON M10 10 MEQ tablet 10 mEq, Daily     Note: Received from: External Pharmacy (Written 09/15/2015 1125)   hydrOXYzine (VISTARIL) 25 MG capsule 25-75 mg, 3 times daily     Note: Received from: External Pharmacy (Written 01/05/2014 1434)  onazePAM (KLONOPIN) 1 MG tablet 1-2 mg, 2 times daily     Note: Received from: External Pharmacy (Written 01/05/2014 1434)   atorvastatin (LIPITOR) 80 MG tablet 80 mg, Daily-1800     aspirin EC 81 MG EC tablet 81 mg, Daily     amLODipine (NORVASC) 5 MG tablet 5 mg, Daily    Medications self-administered by patient during sleep study : No sleep medicine administered.   SLEEP ARCHITECTURE The study was initiated at 10:09:12 PM and ended at 4:49:28 AM. Sleep onset time was 37.8 minutes and the sleep efficiency was 60.8%. The total sleep time was 243.5 minutes. Wake after sleep onset (WASO) was 118.9 minutes Stage REM latency was 144.5 minutes. The patient spent 33.06% of the night in stage N1 sleep, 55.03% in stage N2 sleep, 0.00% in stage N3 and 11.91% in REM. Alpha intrusion was absent. Supine sleep was 10.47%.   RESPIRATORY PARAMETERS The overall apnea/hypopnea index (AHI) was 12.8 per hour. There were 30 total apneas, including 8 obstructive, 6 central and 16 mixed apneas. There were 22 hypopneas and 5 RERAs. The AHI during Stage REM sleep was 2.1 per hour. AHI while supine was 98.8 per hour. The mean oxygen saturation was 93.36%. The minimum SpO2 during sleep was 85.00%. Soft snoring was noted during this study.   CARDIAC DATA The 2 lead EKG demonstrated sinus rhythm. The mean heart rate was 63.27 beats per minute. Other EKG findings include: None.   LEG MOVEMENT DATA The total PLMS were 404 with a resulting PLMS index of 99.55. Associated arousal with leg movement index was 5.2 .   IMPRESSIONS - Mild obstructive sleep apnea occurred during this study (AHI = 12.8/h); events were severe with supine  position (AHI 98.8/h). - No significant central sleep apnea occurred during this study (CAI = 1.5/h). - Mild oxygen desaturation  to a nadir of 85.00%. - Reduced sleep efficiency at 60.8%. - Abnormal sleep architecture with absence of slow-wave sleep and prolonged latency to a REM sleep. - The patient snored with Soft snoring volume. - No cardiac abnormalities were noted during this study. - Severe periodic limb movements of sleep occurred during the study. Associated arousals were significant. - The arousal index was abnormal   DIAGNOSIS - Obstructive Sleep Apnea (327.23 [G47.33 ICD-10])   RECOMMENDATIONS - Recommend therapeutic CPAP titration to determine optimal pressure required to alleviate sleep disordered breathing. - Efforts should be made to optimize nasal and oropharyngeal patency. - The patient should be counseled to avoid supine sleep; consider positional therapy avoiding supine position during sleep. - Avoid alcohol, sedatives and other CNS depressants that may worsen sleep apnea and disrupt normal sleep architecture. - Sleep hygiene should be reviewed to assess factors that may improve sleep quality. - Weight management and regular exercise should be initiated or continued if appropriate.  ----------------------------------------------------------------------------------------------   06/28/2016: CLINICAL INFORMATION The patient is referred for a CPAP titration to treat sleep apnea.   Date of NPSG, Split Night or HST:  05/07/2016  AHI 12.8/h; RDI 14.0/h    SLEEP STUDY  TECHNIQUE As per the SM Manual for the Scoring of Sleep and Associated Events v2.3 (April 2016) with a hypopnea requiring 4% desaturations. The channels recorded and monitored were frontal, central and occipital EEG, electrooculogram (EOG), submentalis EMG (chin), nasal and oral airflow, thoracic and abdominal wall motion, anterior tibialis EMG, snore microphone, electrocardiogram, and pulse oximetry.  Continuous positive airway pressure (CPAP) was initiated at the beginning of the study and titrated to treat sleep-disordered breathing.   MEDICATIONS amLODipine (NORVASC) 5 MG tablet Taking 02/03/16 -- Burtis Junes, NP Take 1 tablet (5 mg total) by mouth daily. aspirin EC 81 MG EC tablet Taking 09/17/15 -- Rogelia Mire, NP Take 1 tablet (81 mg total) by mouth daily. atorvastatin (LIPITOR) 80 MG tablet 02/27/16 -- Belva Crome, MD Take 1 tablet (80 mg total) by mouth daily at 6 PM. clonazePAM (KLONOPIN) 1 MG tablet Taking 12/05/13 -- Historical Provider, MD Notes:  Received from: External Pharmacy hydrOXYzine (VISTARIL) 25 MG capsule Taking 01/02/14 -- Historical Provider, MD Notes:  Received from: External Pharmacy KLOR-CON M10 10 MEQ tablet Taking 09/06/15 -- Historical Provider, MD Notes:  Received from: External Pharmacy Loratadine (CLARITIN) 10 MG CAPS Taking -- -- Historical Provider, MD losartan-hydrochlorothiazide Konrad Penta) 50-12.5 MG per tablet Taking 10/10/13 -- Historical Provider, MD Notes:  Received from: External Pharmacy nitroGLYCERIN (NITROSTAT) 0.4 MG SL tablet Taking 09/17/15 -- Rogelia Mire, NP Place 1 tablet (0.4 mg total) under the tongue every 5 (five) minutes x 3 doses as needed for chest pain. Omega-3 Fatty Acids (FISH OIL) 1000 MG CAPS Taking -- -- Historical Provider, MD PARoxetine (PAXIL) 40 MG tablet Taking 08/28/15 -- Historical Provider, MD Notes:  Received from: External Pharmacy saw palmetto 160 MG capsule Taking -- -- Historical Provider, MD ticagrelor (BRILINTA) 90 MG TABS tablet Taking 02/13/16 -- Rogelia Mire, NP Take 1 tablet (90 mg total) by mouth 2 (two) times daily. vitamin B-12 (CYANOCOBALAMIN) 100 MCG tablet   Medications administered by patient during sleep study : No sleep medicine administered.   TECHNICIAN COMMENTS Comments added by technician: none  Comments added by scorer: N/A   RESPIRATORY PARAMETERS Optimal  PAP Pressure (cm):  13        AHI at Optimal Pressure (/hr):            0.0 Overall Minimal O2 (%):         89.00   Supine % at Optimal Pressure (%):    0 Minimal O2 at Optimal Pressure (%): 92.0                   SL EEP ARCHITECTURE The study was initiated at 10:43:01 PM and ended at 5:14:37 AM. Sleep onset time was 35.7 minutes and the sleep efficiency was 82.2%. The total sleep time was 322.0 minutes. Wakr after sleep onset (WASO) was 33.9 minutes. The patient spent 12.58% of the night in stage N1 sleep, 79.97% in stage N2 sleep, 0.00% in stage N3 and 7.45% in REM.Stage REM latency was 240.0 minutes Wake after sleep onset was 33.9. Alpha intrusion was absent. Supine sleep was 42.55%.   CARDIAC DATA The 2 lead EKG demonstrated sinus rhythm. The mean heart rate was 64.22 beats per minute. Other EKG findings include: None.   LEG MOVEMENT DATA The total Periodic Limb Movements of Sleep (PLMS) were 64. The PLMS index was 11.93. A PLMS index of <15 is considered normal in adults.   IMPRESSIONS - The optimal PAP pressure was 13 cm of water. -  Central sleep apnea was not noted during this titration (CAI = 1.7/h). - Mild oxygen desaturations were observed during this titration (min O2 = 89.00%). - The patient snored with Soft snoring volume during this titration study with resolution at 13 cm water presssure. - No cardiac abnormalities were observed during this study. - Mild periodic limb movements were observed during this study. Arousals associated with PLMs were rare.   DIAGNOSIS - Obstructive Sleep Apnea (327.23 [G47.33 ICD-10])   RECOMMENDATIONS - Recommend an initial trial of CPAP therapy with EPR of 3 at 13 cm H2O with heated humidification.  A Medium size Resmed Full Face Mask AirFit F20 mask was ued for the titration. - Avoid alcohol, sedatives and other CNS depressants that may worsen sleep apnea and disrupt normal sleep architecture. - Sleep hygiene should be reviewed to assess  factors that may improve sleep quality. - Weight management and regular exercise should be initiated or continued. - Recommend a download be obtained in 30 days and sleep clinic evaluation.    ASSESSMENT:    1. OSA (obstructive sleep apnea)   2. Primary hypertension   3. Coronary artery disease involving native coronary artery of native heart without angina pectoris   4. NSTEMI (non-ST elevated myocardial infarction) [facility]): PCI to LCx 2016   5. Type 2 diabetes mellitus with complication, without long-term current use of insulin ([place])   6. Hyperlipidemia with target LDL less than 70   7. Anxiety and depression     PLAN:  Joseph Fitzpatrick is a 71 year old gentleman who has established cardiovascular comorbidities including CAD, status post stenting of his left circumflex coronary artery following a non-STEMI in 2016, as well as a history of hypertension, and significant prior tobacco use having quit cigarettes in 2016 but does vape.  Due to concerns for obstructive sleep apnea he underwent a sleep evaluation in 2017 and was found to have mild overall sleep apnea with an AHI of 12.8/h however events were very severe with supine position with an AHI of 98.8/h.  He has been on CPAP therapy since his set up date on August 13, 2016 and has a ResMed air sense 10 CPAP AutoSet unit.  Recently, his machine is now starting to make noises.  In addition, with 5G technology upgrades, is no longer wireless.  He has continued to use CPAP therapy with 100% daily use.  However, his sleep is interrupted.  He typically goes to bed between midnight and 1 AM but oftentimes may be up around 4.  He admits to vivid dreams.  He keeps his mask on but does not sleep but may then fall back to sleep approximately 3 to 4 hours later and stays in bed until almost noon.  As result his most recent download shows average mask use at 11 hours and 40 minutes but this is not entirely sleep time.  His most recent CPAP  pressure range is set at 8 to 20 cm of water and his 95th percentile pressure is 12.9 with maximum average pressure of 15.4 with an AHI of 2.4.  He is continuing to use CPAP therapy and feels he continues to derive significant benefit.  With his continued use of treatment, and his old machine, he qualifies for a new machine and place an order for him to receive a new ResMed AirSense 11 CPAP auto unit.  I will change his pressure range to 10 to 20 cm of water with EPR of 3.  [facility] has been  his DME company but if they are unable to obtain a new machine he may need to be transferred to a another company based on his insurance.  I again reviewed the importance of continuing to use CPAP therapy with his cardiovascular comorbidities and again rediscuss sleep apnea playing a role in contributing to difficulty control blood pressure, nocturnal arrhythmias, increased risk for atrial fibrillation, negative effects on insulin resistance, increased inflammatory markers and nocturnal GERD.  In addition with his underlying atherosclerosis if sleep apnea is untreated the potential risk of nocturnal hypoxemia contributing to ischemia both cardiac as well as cerebrovascular.  His blood pressure today was stable and I repeat by me was 130/78 on his medical regimen of amlodipine 5 mg, losartan HCT 50/12.5 mg, spironolactone 12.5 mg and metoprolol succinate 25 mg daily.  He is diabetic on Jardiance in addition to metformin.  He takes paroxetine and clonazepam for anxiety/depression.  He continues to be on high potency statin therapy with his CAD on atorvastatin 80 mg daily I will need to see him within 90 days of receiving new equipment and further recommendations and adjustments will be made at that time.   Medication Adjustments/Labs and Tests Ordered: Current medicines are reviewed at length with the patient today.  Concerns regarding medicines are outlined above.  Medication changes, Labs and Tests ordered today  are listed in the Patient Instructions below. Patient Instructions  Medication Instructions:  Continue same medications *If you need a refill on your cardiac medications before your next appointment, please call your pharmacy*   Lab Work: None ordered   Testing/Procedures: None ordered   Follow-Up: At [facility], you and your health needs are our priority.  As part of our continuing mission to provide you with exceptional heart care, we have created designated Provider Care Teams.  These Care Teams include your primary Cardiologist (physician) and Advanced Practice Providers (APPs -  Physician Assistants and Nurse Practitioners) who all work together to provide you with the care you need, when you need it.  We recommend signing up for the patient portal called "MyChart".  Sign up information is provided on this After Visit Summary.  MyChart is used to connect with patients for Virtual Visits (Telemedicine).  Patients are able to view lab/test results, encounter notes, upcoming appointments, etc.  Non-urgent messages can be sent to your provider as well.   To learn more about what you can do with MyChart, go to [email or web address].    Your next appointment:  3 to 4 months after new cpap machine    The format for your next appointment: Office   Provider:  Claiborne Billings   Important Information About Sugar         Signed, Shelva Majestic, MD,FACC, ABSM Diplomate, [place] Board [employer]  09/10/2022 [place] [address] [place], [place], [place]  [address] Phone: [phone]

## 2022-09-10 ENCOUNTER — Encounter: Payer: Self-pay | Admitting: Cardiovascular Disease

## 2022-09-12 ENCOUNTER — Encounter: Payer: Self-pay | Admitting: Cardiovascular Disease

## 2022-09-21 ENCOUNTER — Telehealth: Payer: Self-pay | Admitting: Cardiovascular Disease

## 2022-09-21 NOTE — Telephone Encounter (Signed)
Patient called in wanting to discuss the later he received from the CPAP manufacture. Please advise

## 2022-09-25 DIAGNOSIS — F39 Unspecified mood [affective] disorder: Secondary | ICD-10-CM | POA: Diagnosis not present

## 2022-09-25 NOTE — Telephone Encounter (Signed)
Left message call being returned. My direct line phone number left for the patient to return a call to me.

## 2022-09-25 NOTE — Telephone Encounter (Signed)
Patient returned a call and he was notified that [facility] does not participate with his HTA insurance. Patient asked if I could send RX to [facility] on [address]. I told him per the pharmacist they do not file his insurance and they only have a limited supply of items. They also will not manage the machine. Patient informed he has to have a MDE company. Records and RX sent to [facility]. Patient was given [facility] information to contact if he does not hear back from them.

## 2022-10-22 ENCOUNTER — Telehealth: Payer: Self-pay | Admitting: Cardiovascular Disease

## 2022-10-22 NOTE — Telephone Encounter (Signed)
Patient states his CPAP stopped working and [employer] is needing documents from the office.

## 2022-10-23 DIAGNOSIS — J342 Deviated nasal septum: Secondary | ICD-10-CM | POA: Diagnosis not present

## 2022-10-23 DIAGNOSIS — H6521 Chronic serous otitis media, right ear: Secondary | ICD-10-CM | POA: Diagnosis not present

## 2022-10-23 DIAGNOSIS — J310 Chronic rhinitis: Secondary | ICD-10-CM | POA: Diagnosis not present

## 2022-10-23 DIAGNOSIS — J343 Hypertrophy of nasal turbinates: Secondary | ICD-10-CM | POA: Diagnosis not present

## 2022-10-23 DIAGNOSIS — H9313 Tinnitus, bilateral: Secondary | ICD-10-CM | POA: Diagnosis not present

## 2022-10-23 DIAGNOSIS — H903 Sensorineural hearing loss, bilateral: Secondary | ICD-10-CM | POA: Diagnosis not present

## 2022-10-24 DIAGNOSIS — D649 Anemia, unspecified: Secondary | ICD-10-CM | POA: Diagnosis not present

## 2022-10-24 DIAGNOSIS — E785 Hyperlipidemia, unspecified: Secondary | ICD-10-CM | POA: Diagnosis not present

## 2022-10-24 DIAGNOSIS — I10 Essential (primary) hypertension: Secondary | ICD-10-CM | POA: Diagnosis not present

## 2022-10-24 DIAGNOSIS — M545 Low back pain, unspecified: Secondary | ICD-10-CM | POA: Diagnosis not present

## 2022-10-24 DIAGNOSIS — E1169 Type 2 diabetes mellitus with other specified complication: Secondary | ICD-10-CM | POA: Diagnosis not present

## 2022-10-24 NOTE — Telephone Encounter (Signed)
Message sent to Darlina Guys with [place] her the requested information has been uploaded 3 times. The  [employer] portal does not seem to be retrieving the information. Since they have access to [facility] I recommend they go in and get Dr Evette Georges office note dated 09/05/22. This has the information in it they are requesting. Melissa sent a message to Alyse Low via [place] to go into [employer] and pull the note.

## 2022-10-25 NOTE — Telephone Encounter (Signed)
Patient informed per [employer] portal [employer] is waiting on insurance approval.

## 2022-10-25 NOTE — Telephone Encounter (Signed)
Patient stated he has not had his CPAP machine for three weeks now and he stated he really needs this equipment.

## 2022-10-29 NOTE — Progress Notes (Unsigned)
Cardiology Office Note:    Date:  10/30/2022   ID:  Joseph Fitzpatrick, DOB [birth year], MRN [id number]  PCP:  Leighton Ruff, MD (Inactive)  Cardiologist:  Sinclair Grooms, MD   Referring MD: No ref. provider found   Chief Complaint  Patient presents with   Hypertension   Coronary Artery Disease    History of Present Illness:    Joseph Fitzpatrick is a 71 y.o. male with a hx of CAD with prior stent to the LCX, prior NSTEMI in 2016 with OM1 felt to be the culprit vessel and this was successfully treated with a 2.25 x 32 mm Promus Premier drug-eluting stent.  Fractional flow reserve was measured across the RCA lesion and was normal at 0.93. His other issues include HTN, depression, tobacco abuse and HLD. He did have an abdominal aortogram in 06/2017 for claudication - found to have minimal disease in the aortoiliac arterial system and 50% stenosis in distal superfical femoral artery.    Re-evaluated for OSA by Dr. Claiborne Billings in interval.   He was seen by Dr. Martinique when he had his acute MI in 2016 request that Dr. Martinique be his primary cardiologist.  Today he is having elevated blood pressure.  It has been this way now for several office visit with different doctors.  He has not had angina and denies orthopnea and PND.  He is compliant with his medications.  He took all of his antihypertensive therapy this morning including HCTZ, Toprol-XL, amlodipine, and spironolactone.  Past Medical History:  Diagnosis Date   Anxiety    Borderline diabetes    a. 08/2015 HbA1c = 5.9.   BPH (benign prostatic hypertrophy)    CAD (coronary artery disease)    a. 08/2015 NSTEMI/PCI: LM nl, LAD nl, RI nl, LCX 20p, OM1 95 (2.25x32 Pomus Premier DES), RCA 32m(FFR 0.93)->Med Rx, EF 55-65%.   Depression    Essential hypertension    GERD (gastroesophageal reflux disease)    Hematuria    History of duodenal ulcer    History of hiatal hernia    "suspected; never confirmed"   History of nephrolithiasis     History of pneumonia 1993 X 1   History of substance abuse ([place]    Hypercholesterolemia    Hypokalemia    IBS (irritable bowel syndrome)    PVCs (premature ventricular contractions)    Tobacco abuse     Past Surgical History:  Procedure Laterality Date   ABDOMINAL AORTOGRAM W/LOWER EXTREMITY N/A 07/18/2017   Procedure: ABDOMINAL AORTOGRAM W/LOWER EXTREMITY;  Surgeon: CConrad Comal MD;  [facility];  Service: Cardiovascular;  Laterality: N/A;   CARDIAC CATHETERIZATION N/A 09/16/2015   Procedure: Left Heart Cath and Coronary Angiography;  Surgeon: Peter M JMartinique MD;  [facility];  Service: Cardiovascular;  Laterality: N/A;   CARDIAC CATHETERIZATION  09/16/2015   Procedure: Intravascular Pressure Wire/FFR Study;  Surgeon: Peter M JMartinique MD;  [facility];  Service: Cardiovascular;;   CARDIAC CATHETERIZATION  09/16/2015   Procedure: Coronary Stent Intervention;  Surgeon: Peter M JMartinique MD;  [facility];  Service: Cardiovascular;;   CHOLECYSTECTOMY OPEN     TONSILLECTOMY      Current Medications: Current Meds  Medication Sig   acetaminophen (TYLENOL) 325 MG tablet Take 2 tablets by mouth as needed.   amLODipine (NORVASC) 5 MG tablet TAKE 1 TABLET (5 MG TOTAL) BY MOUTH DAILY.   aspirin EC 81 MG  EC tablet Take 1 tablet (81 mg total) by mouth daily.   atorvastatin (LIPITOR) 80 MG tablet Take 1 tablet (80 mg total) by mouth daily.   clonazePAM (KLONOPIN) 1 MG tablet Take 1 mg by mouth 3 (three) times daily as needed for anxiety.   empagliflozin (JARDIANCE) 10 MG TABS tablet Take 1 tablet (10 mg total) by mouth daily before breakfast.   hydrOXYzine (VISTARIL) 25 MG capsule Take 25-75 mg by mouth 3 (three) times daily. Take 1 capsule ('25mg'$ ) at 0800,2 capsules ('50mg'$ ) at 1400, and 3 capsules (75 mg) at 2000.   Loratadine 10 MG CAPS Take 10 mg by mouth daily as needed (for allergies.).    losartan-hydrochlorothiazide  (HYZAAR) 100-25 MG tablet Take 1 tablet by mouth daily.   metFORMIN (GLUCOPHAGE-XR) 500 MG 24 hr tablet 500 mg daily.   metoprolol succinate (TOPROL-XL) 50 MG 24 hr tablet Take 1 tablet (50 mg total) by mouth daily. Take with or immediately following a meal.   Multiple Vitamins-Minerals (VITAMIN D3 COMPLETE PO) Take by mouth.   nitroGLYCERIN (NITROSTAT) 0.4 MG SL tablet Place 1 tablet (0.4 mg total) under the tongue every 5 (five) minutes x 3 doses as needed for chest pain.   Omega-3 Fatty Acids (FISH OIL) 1200 MG CAPS Take 1,200 mg by mouth 2 (two) times daily. (0800 & 2000)   pantoprazole (PROTONIX) 40 MG tablet Take 40 mg by mouth 2 (two) times daily.   PARoxetine (PAXIL) 20 MG tablet Take 20 mg by mouth daily. Taking with '40mg'$  to make '60mg'$  daily   PARoxetine (PAXIL) 40 MG tablet Take 40 mg by mouth daily. (0800) Taking with '20mg'$  to make '60mg'$  daily   potassium chloride (KLOR-CON) 10 MEQ tablet Take 1 tablet (10 mEq total) by mouth daily.   Probiotic TBEC Take 1 capsule by mouth daily.   spironolactone (ALDACTONE) 25 MG tablet Take 0.5 tablets (12.5 mg total) by mouth daily.   sucralfate (CARAFATE) 1 g tablet Take 1 g by mouth 3 (three) times daily.   tamsulosin (FLOMAX) 0.4 MG CAPS capsule Take 0.4 mg by mouth at bedtime.   TURMERIC PO Take 1 capsule by mouth 3 (three) times daily as needed (for inflammation.).   vitamin B-12 (CYANOCOBALAMIN) 1000 MCG tablet Take 1,000 mcg by mouth daily. (0800)   [DISCONTINUED] losartan-hydrochlorothiazide (HYZAAR) 50-12.5 MG per tablet Take 1 tablet by mouth daily. (0800)   [DISCONTINUED] metoprolol succinate (TOPROL XL) 25 MG 24 hr tablet Take 1 tablet (25 mg total) by mouth daily.     Allergies:   Ibuprofen and Nsaids   Social History   Socioeconomic History   Marital status: Married    Spouse name: Not on file   Number of children: Not on file   Years of education: Not on file   Highest education level: Not on file  Occupational History   Not  on file  Tobacco Use   Smoking status: Former    Packs/day: 0.25    Years: 47.00    Total pack years: 11.75    Types: Cigarettes    Start date: 02/20/1972    Quit date: 07/30/2018    Years since quitting: 4.2   Smokeless tobacco: Never  Vaping Use   Vaping Use: Never used  Substance and Sexual Activity   Alcohol use: Yes    Alcohol/week: 0.0 standard drinks of alcohol    Comment: Hx of EtOH abuse, quit in 1982   Drug use: Yes    Types: Marijuana  Comment: "smoked marijuana 40 years quit in ~ 2014, did a little of qthing in the 1970's for ~ 3 yrs"   Sexual activity: Not Currently  Other Topics Concern   Not on file  Social History Narrative   Lives with his wife and 72 year old son.  Normally ambulates without assistive device, drives, completes ADLs without difficulty.     Social Determinants of Health   Financial Resource Strain: Not on file  Food Insecurity: Not on file  Transportation Needs: Not on file  Physical Activity: Not on file  Stress: Not on file  Social Connections: Not on file     Family History: The patient's family history includes Alzheimer's disease in his mother; Anxiety disorder in his mother; CAD in his father; Depression in his father; High blood pressure in his mother.  ROS:   Please see the history of present illness.    Complains of bulging in his abdominal wall when he strains down.  It goes away when he relaxes his abdomen.  All other systems reviewed and are negative.  EKGs/Labs/Other Studies Reviewed:    The following studies were reviewed today: No new data  EKG:  EKG not repeated  Recent Labs: 03/22/2022: NT-Pro BNP 325 04/11/2022: BUN 16; Creatinine, Ser 1.06; Potassium 3.7; Sodium 138  Recent Lipid Panel    Component Value Date/Time   CHOL 88 (L) 11/15/2015 0857   TRIG 54 11/15/2015 0857   HDL 41 11/15/2015 0857   CHOLHDL 2.1 11/15/2015 0857   VLDL 11 11/15/2015 0857   LDLCALC 36 11/15/2015 0857    Physical Exam:    VS:   BP (!) 180/88   Pulse 94   Ht '5\' 10"'$  (1.778 m)   Wt 220 lb 9.6 oz (100.1 kg)   SpO2 98%   BMI 31.65 kg/m     Wt Readings from Last 3 Encounters:  10/30/22 220 lb 9.6 oz (100.1 kg)  09/05/22 235 lb 12.8 oz (107 kg)  04/26/22 224 lb 12.8 oz (102 kg)     GEN: Obese. No acute distress HEENT: Normal NECK: No JVD. LYMPHATICS: No lymphadenopathy CARDIAC: No murmur. RRR S4 gallop, or edema. VASCULAR:  Normal Pulses. No bruits. RESPIRATORY:  Clear to auscultation without rales, wheezing or rhonchi  ABDOMEN: Ventral hernia below the previous cholecystectomy scar in the right upper quadrant.  Soft, non-tender, non-distended, No pulsatile mass, MUSCULOSKELETAL: No deformity  SKIN: Warm and dry NEUROLOGIC:  Alert and oriented x 3 PSYCHIATRIC:  Normal affect   ASSESSMENT:    1. Coronary artery disease involving native coronary artery of native heart without angina pectoris   2. Chronic diastolic HF (heart failure) ([place])   3. Hyperlipidemia with target LDL less than 70   4. Primary hypertension   5. OSA (obstructive sleep apnea)   6. Type 2 diabetes mellitus with complication, without long-term current use of insulin (HCC)    PLAN:    In order of problems listed above:  Stable.  Chronic longitudinal follow-up for coronary disease and other heart issues will be with Dr. Martinique at the patient's request. Continue current regimen including Jardiance. Continue high intensity statin therapy. Very poorly controlled blood pressure.  He does not add extra salt to his food.  Increase Toprol-XL to 50 mg/day and increase Hyzaar to 100/25 mg by taking 2 of the 50/12.5 mg tablets daily.  Return in 1 week with APP or blood pressure clinic.  Have blood drawn at that time to make sure potassium and creatinine okay.  Will need new prescriptions for higher dose Hyzaar and Toprol-XL.  If still elevated on return and kidney function/potassium okay, spironolactone may need to be increased to 25 mg/day.   Perhaps his blood pressure is aggravated by absence of CPAP now for greater than 2 weeks. Has not yet received CPAP and has been doing without now for about 2 weeks.   Follow-up in 6 to 9 months with Dr. Peter Martinique at the [place] land office for chronic longitudinal cardiac care.  Dr. Martinique took care of him when he had his myocardial infarction.   Medication Adjustments/Labs and Tests Ordered: Current medicines are reviewed at length with the patient today.  Concerns regarding medicines are outlined above.  Orders Placed This Encounter  Procedures   Basic metabolic panel   Meds ordered this encounter  Medications   losartan-hydrochlorothiazide (HYZAAR) 100-25 MG tablet    Sig: Take 1 tablet by mouth daily.    Dose change (increased)   metoprolol succinate (TOPROL-XL) 50 MG 24 hr tablet    Sig: Take 1 tablet (50 mg total) by mouth daily. Take with or immediately following a meal.    Dose change (increased)    Patient Instructions  Medication Instructions:  Your physician has recommended you make the following change in your medication:   1) INCREASE losartan-hydrochlorothiazide to 100-'25mg'$  daily (take 2 tablets of your current dose of 50-12.'5mg'$ , call for refill when needed) 2) INCREASE metoprolol succinate (Toprol XL) to 50 mg daily  *If you need a refill on your cardiac medications before your next appointment, please call your pharmacy*  Lab Work: In 1 week: BMET If you have labs (blood work) drawn today and your tests are completely normal, you will receive your results only by: [place] (if you have MyChart) OR A paper copy in the mail If you have any lab test that is abnormal or we need to change your treatment, we will call you to review the results.  Follow-Up: At [facility], you and your health needs are our priority.  As part of our continuing mission to provide you with exceptional heart care, we have created designated Provider Care Teams.  These  Care Teams include your primary Cardiologist (physician) and Advanced Practice Providers (APPs -  Physician Assistants and Nurse Practitioners) who all work together to provide you with the care you need, when you need it.  Your next appointment:   1 week(s) with APP Nicholes Rough, PA-C, Richardson Dopp, PA-C, Ambrose Pancoast, NP or Christen Bame, NP)  6-8 months with Dr. Peter Martinique  The format for your next appointment:   In Person  Provider:   Peter Martinique, MD  Important Information About Sugar         Signed, Sinclair Grooms, MD  10/30/2022 2:30 PM    [place]

## 2022-10-30 ENCOUNTER — Encounter: Payer: Self-pay | Admitting: Interventional Cardiology

## 2022-10-30 ENCOUNTER — Ambulatory Visit: Payer: PPO | Attending: Interventional Cardiology | Admitting: Interventional Cardiology

## 2022-10-30 VITALS — BP 180/88 | HR 94 | Ht 70.0 in | Wt 220.6 lb

## 2022-10-30 DIAGNOSIS — I5032 Chronic diastolic (congestive) heart failure: Secondary | ICD-10-CM | POA: Diagnosis not present

## 2022-10-30 DIAGNOSIS — I2510 Atherosclerotic heart disease of native coronary artery without angina pectoris: Secondary | ICD-10-CM

## 2022-10-30 DIAGNOSIS — G4733 Obstructive sleep apnea (adult) (pediatric): Secondary | ICD-10-CM | POA: Diagnosis not present

## 2022-10-30 DIAGNOSIS — I10 Essential (primary) hypertension: Secondary | ICD-10-CM | POA: Diagnosis not present

## 2022-10-30 DIAGNOSIS — E118 Type 2 diabetes mellitus with unspecified complications: Secondary | ICD-10-CM

## 2022-10-30 DIAGNOSIS — E785 Hyperlipidemia, unspecified: Secondary | ICD-10-CM

## 2022-10-30 MED ORDER — METOPROLOL SUCCINATE ER 50 MG PO TB24: 50.0000 mg | ORAL_TABLET | Freq: Every day | ORAL | Status: DC

## 2022-10-30 MED ORDER — LOSARTAN POTASSIUM-HCTZ 100-25 MG PO TABS: 1.0000 | ORAL_TABLET | Freq: Every day | ORAL | Status: DC

## 2022-10-30 NOTE — Patient Instructions (Signed)
Medication Instructions:  Your physician has recommended you make the following change in your medication:   1) INCREASE losartan-hydrochlorothiazide to 100-'25mg'$  daily (take 2 tablets of your current dose of 50-12.'5mg'$ , call for refill when needed) 2) INCREASE metoprolol succinate (Toprol XL) to 50 mg daily  *If you need a refill on your cardiac medications before your next appointment, please call your pharmacy*  Lab Work: In 1 week: BMET If you have labs (blood work) drawn today and your tests are completely normal, you will receive your results only by: [place] (if you have MyChart) OR A paper copy in the mail If you have any lab test that is abnormal or we need to change your treatment, we will call you to review the results.  Follow-Up: At [facility], you and your health needs are our priority.  As part of our continuing mission to provide you with exceptional heart care, we have created designated Provider Care Teams.  These Care Teams include your primary Cardiologist (physician) and Advanced Practice Providers (APPs -  Physician Assistants and Nurse Practitioners) who all work together to provide you with the care you need, when you need it.  Your next appointment:   1 week(s) with APP Nicholes Rough, PA-C, Richardson Dopp, PA-C, Ambrose Pancoast, NP or Christen Bame, NP)  6-8 months with Dr. Peter Martinique  The format for your next appointment:   In Person  Provider:   Peter Martinique, MD  Important Information About Sugar

## 2022-11-02 NOTE — Telephone Encounter (Signed)
  Pt calling to f/u his CPAP. Per Mariann Laster will get f/u and will get in touch with pt today. Pt is upset to why its taking so long to get his CPAP

## 2022-11-05 NOTE — Telephone Encounter (Signed)
Per Darlina Guys @ [employer] the patient has also contacted them and was told they are waiting to hear back from his insurance. There is nothing else that I can offer the patient other than switching DME companies, which will take longer because they too will have to re submit a request to his insurance company as well. Currently the hold up is due to waiting for insurance approval.

## 2022-11-06 ENCOUNTER — Ambulatory Visit: Payer: PPO | Admitting: Nurse Practitioner

## 2022-11-06 ENCOUNTER — Other Ambulatory Visit: Payer: PPO

## 2022-11-06 ENCOUNTER — Telehealth: Payer: Self-pay | Admitting: *Deleted

## 2022-11-06 NOTE — Telephone Encounter (Signed)
Received a call from [place] with [employer] informing me the patient has requested a change from [place] back to them. She cancelled the order sent to [employer] and re-entered it under [place]. She requested for me to send her the recent office note with a order for his replacement CPAP machine. Order faxed per her request to [phone].

## 2022-11-12 NOTE — Telephone Encounter (Signed)
Patient is asking for a call back. Please advise

## 2022-11-12 NOTE — Telephone Encounter (Signed)
Called [employer] and spoke with Shirlean Mylar to get the status of this patient's machine. She informed me she has received his approval and will call the patient to set up appointment for CPAP.

## 2022-11-13 DIAGNOSIS — R198 Other specified symptoms and signs involving the digestive system and abdomen: Secondary | ICD-10-CM | POA: Diagnosis not present

## 2022-11-13 DIAGNOSIS — S70211A Abrasion, right hip, initial encounter: Secondary | ICD-10-CM | POA: Diagnosis not present

## 2022-11-13 DIAGNOSIS — K439 Ventral hernia without obstruction or gangrene: Secondary | ICD-10-CM | POA: Diagnosis not present

## 2022-11-13 DIAGNOSIS — Z23 Encounter for immunization: Secondary | ICD-10-CM | POA: Diagnosis not present

## 2022-11-13 DIAGNOSIS — M545 Low back pain, unspecified: Secondary | ICD-10-CM | POA: Diagnosis not present

## 2022-11-15 ENCOUNTER — Other Ambulatory Visit: Payer: PPO

## 2022-11-15 ENCOUNTER — Ambulatory Visit: Payer: PPO | Admitting: Nurse Practitioner

## 2022-11-23 DIAGNOSIS — M25561 Pain in right knee: Secondary | ICD-10-CM | POA: Diagnosis not present

## 2022-12-11 ENCOUNTER — Telehealth: Payer: Self-pay | Admitting: *Deleted

## 2022-12-11 NOTE — Telephone Encounter (Signed)
Received a call from [place] at [place]. States the patient has missed a second set up appointment. Told them he could not come today due to having vertigo.

## 2022-12-19 NOTE — Progress Notes (Deleted)
Office Visit    Patient Name: Joseph Fitzpatrick Date of Encounter: 12/19/2022  Primary Care Provider:  Leighton Ruff, MD (Inactive) Primary Cardiologist:  Sinclair Grooms, MD Primary Electrophysiologist: None  Chief Complaint    Joseph Fitzpatrick is a 72 y.o. male with PMH of CAD s/p NSTEMI 2016 treated with DES x 1 to OM 1 and 60% RCA treated with medical therapy, HTN, HLD, PAD s/p abdominal aortogram 2018 with 50% stenosis in distal right who presents today for 1 month follow-up of elevated blood pressure.  Past Medical History    Past Medical History:  Diagnosis Date   Anxiety    Borderline diabetes    a. 08/2015 HbA1c = 5.9.   BPH (benign prostatic hypertrophy)    CAD (coronary artery disease)    a. 08/2015 NSTEMI/PCI: LM nl, LAD nl, RI nl, LCX 20p, OM1 95 (2.25x32 Pomus Premier DES), RCA 87m(FFR 0.93)->Med Rx, EF 55-65%.   Depression    Essential hypertension    GERD (gastroesophageal reflux disease)    Hematuria    History of duodenal ulcer    History of hiatal hernia    "suspected; never confirmed"   History of nephrolithiasis    History of pneumonia 1993 X 1   History of substance abuse ([facility]    Hypercholesterolemia    Hypokalemia    IBS (irritable bowel syndrome)    PVCs (premature ventricular contractions)    Tobacco abuse    Past Surgical History:  Procedure Laterality Date   ABDOMINAL AORTOGRAM W/LOWER EXTREMITY N/A 07/18/2017   Procedure: ABDOMINAL AORTOGRAM W/LOWER EXTREMITY;  Surgeon: CConrad [place] MD;  [facility];  Service: Cardiovascular;  Laterality: N/A;   CARDIAC CATHETERIZATION N/A 09/16/2015   Procedure: Left Heart Cath and Coronary Angiography;  Surgeon: Peter M JMartinique MD;  [facility];  Service: Cardiovascular;  Laterality: N/A;   CARDIAC CATHETERIZATION  09/16/2015   Procedure: Intravascular Pressure Wire/FFR Study;  Surgeon: Peter M JMartinique MD;  [facility];  Service:  Cardiovascular;;   CARDIAC CATHETERIZATION  09/16/2015   Procedure: Coronary Stent Intervention;  Surgeon: Peter M JMartinique MD;  [facility];  Service: Cardiovascular;;   CHOLECYSTECTOMY OPEN     TONSILLECTOMY      Allergies  Allergies  Allergen Reactions   Ibuprofen     Other reaction(s): whacky   Nsaids     Other reaction(s): Unknown    History of Present Illness    Joseph Fitzpatrick is a 72year old male with the above mention past medical history who presents today for of elevated blood pressures.  Mr. HMason Jimwas seen initially in 2016 when he presented to the ED with complaint of chest pain and elevated blood pressure.  EKG was completed showing poor R wave progression in anterior leads with TWI.  He was ruled in for NSTEMI and placed on heparin drip and underwent LHC that revealed severe disease within the first obtuse marginal and moderate RCA disease.  The OM 1 lesion was treated with DES x 1 and RCA lesion was measured with FFR and found to be normal at 0.93 with medical therapy recommended.  He was discharged and started on GDMT.  He was seen in follow-up in 2018 and lost to follow-up until 2021.  He reestablish care with Dr. STamala Julianand was last seen in office on 10/30/2022 with elevated blood pressure.  He was without his CPAP  for greater than 2 weeks and blood pressure elevation was thought to be reflected by lack of therapy.  He reported compliance with his current medication regimen and noted no adverse reactions.  He is Toprol-XL was increased to 50 mg and Hyzaar was increased to 100/25 mg.  He will be followed by Dr. Martinique for longitudinal care.  Since last being seen in the office patient reports***.  Patient denies chest pain, palpitations, dyspnea, PND, orthopnea, nausea, vomiting, dizziness, syncope, edema, weight gain, or early satiety.   ***Notes: -He will now be followed by Dr. Martinique and during previous visit Toprol was increased and Hyzaar was also  increased -Patient may need spironolactone increased to 25 mg Home Medications    Current Outpatient Medications  Medication Sig Dispense Refill   acetaminophen (TYLENOL) 325 MG tablet Take 2 tablets by mouth as needed.     amLODipine (NORVASC) 5 MG tablet TAKE 1 TABLET (5 MG TOTAL) BY MOUTH DAILY. 90 tablet 3   aspirin EC 81 MG EC tablet Take 1 tablet (81 mg total) by mouth daily.     atorvastatin (LIPITOR) 80 MG tablet Take 1 tablet (80 mg total) by mouth daily. 90 tablet 3   clonazePAM (KLONOPIN) 1 MG tablet Take 1 mg by mouth 3 (three) times daily as needed for anxiety.     empagliflozin (JARDIANCE) 10 MG TABS tablet Take 1 tablet (10 mg total) by mouth daily before breakfast. 30 tablet 11   hydrOXYzine (VISTARIL) 25 MG capsule Take 25-75 mg by mouth 3 (three) times daily. Take 1 capsule ('25mg'$ ) at 0800,2 capsules ('50mg'$ ) at 1400, and 3 capsules (75 mg) at 2000.     Loratadine 10 MG CAPS Take 10 mg by mouth daily as needed (for allergies.).      losartan-hydrochlorothiazide (HYZAAR) 100-25 MG tablet Take 1 tablet by mouth daily.     metFORMIN (GLUCOPHAGE-XR) 500 MG 24 hr tablet 500 mg daily.     metoprolol succinate (TOPROL-XL) 50 MG 24 hr tablet Take 1 tablet (50 mg total) by mouth daily. Take with or immediately following a meal.     Multiple Vitamins-Minerals (VITAMIN D3 COMPLETE PO) Take by mouth.     nitroGLYCERIN (NITROSTAT) 0.4 MG SL tablet Place 1 tablet (0.4 mg total) under the tongue every 5 (five) minutes x 3 doses as needed for chest pain. 25 tablet 3   Omega-3 Fatty Acids (FISH OIL) 1200 MG CAPS Take 1,200 mg by mouth 2 (two) times daily. (0800 & 2000)     pantoprazole (PROTONIX) 40 MG tablet Take 40 mg by mouth 2 (two) times daily.     PARoxetine (PAXIL) 20 MG tablet Take 20 mg by mouth daily. Taking with '40mg'$  to make '60mg'$  daily     PARoxetine (PAXIL) 40 MG tablet Take 40 mg by mouth daily. (0800) Taking with '20mg'$  to make '60mg'$  daily  11   potassium chloride (KLOR-CON) 10 MEQ  tablet Take 1 tablet (10 mEq total) by mouth daily. 90 tablet 3   Probiotic TBEC Take 1 capsule by mouth daily.     spironolactone (ALDACTONE) 25 MG tablet Take 0.5 tablets (12.5 mg total) by mouth daily. 45 tablet 3   sucralfate (CARAFATE) 1 g tablet Take 1 g by mouth 3 (three) times daily.     tamsulosin (FLOMAX) 0.4 MG CAPS capsule Take 0.4 mg by mouth at bedtime.     TURMERIC PO Take 1 capsule by mouth 3 (three) times daily as needed (for inflammation.).  vitamin B-12 (CYANOCOBALAMIN) 1000 MCG tablet Take 1,000 mcg by mouth daily. (0800)     No current facility-administered medications for this visit.     Review of Systems  Please see the history of present illness.    (+)*** (+)***  All other systems reviewed and are otherwise negative except as noted above.  Physical Exam    Wt Readings from Last 3 Encounters:  10/30/22 220 lb 9.6 oz (100.1 kg)  09/05/22 235 lb 12.8 oz (107 kg)  04/26/22 224 lb 12.8 oz (102 kg)   [id number] were no vitals filed for this visit.,There is no height or weight on file to calculate BMI.  Constitutional:      Appearance: Healthy appearance. Not in distress.  Neck:     Vascular: JVD normal.  Pulmonary:     Effort: Pulmonary effort is normal.     Breath sounds: No wheezing. No rales. Diminished in the bases Cardiovascular:     Normal rate. Regular rhythm. Normal S1. Normal S2.      Murmurs: There is no murmur.  Edema:    Peripheral edema absent.  Abdominal:     Palpations: Abdomen is soft non tender. There is no hepatomegaly.  Skin:    General: Skin is warm and dry.  Neurological:     General: No focal deficit present.     Mental Status: Alert and oriented to person, place and time.     Cranial Nerves: Cranial nerves are intact.  EKG/LABS/Other Studies Reviewed    ECG personally reviewed by me today - ***  Risk Assessment/Calculations:   {Does this patient have ATRIAL FIBRILLATION?:[id number]}        Lab Results  Component  Value Date   WBC 7.5 09/29/2018   HGB 13.4 09/29/2018   HCT 42.0 09/29/2018   MCV 92 09/29/2018   PLT 234 09/29/2018   Lab Results  Component Value Date   CREATININE 1.06 04/11/2022   BUN 16 04/11/2022   NA 138 04/11/2022   K 3.7 04/11/2022   CL 99 04/11/2022   CO2 20 04/11/2022   Lab Results  Component Value Date   ALT 17 11/15/2015   AST 17 11/15/2015   ALKPHOS 58 11/15/2015   BILITOT 0.4 11/15/2015   Lab Results  Component Value Date   CHOL 88 (L) 11/15/2015   HDL 41 11/15/2015   LDLCALC 36 11/15/2015   TRIG 54 11/15/2015   CHOLHDL 2.1 11/15/2015    Lab Results  Component Value Date   HGBA1C 5.9 (H) 09/16/2015    Assessment & Plan    1.  Coronary artery disease: -s/p NSTEMI in 2016 treated with DES x 1 to LAD and RCA lesion treated with GDMT. -Today patient reports*** -Continue current GDMT with Lipitor 80 mg, ASA 81 mg metoprolol 50 mg  2.  Essential hypertension: -Patient's blood pressure noted to be elevated at previous visit and Toprol-XL increased along with Hyzaar. -Today patient's blood pressure is*** -  3.  Hyperlipidemia: -Patient's last LDL cholesterol was*** -Continue Lipitor 80 mg daily  4.  History of obstructive sleep apnea: -Patient currently is***  5.  Peripheral artery disease: -Patient had moderate to severe PAD 6 years ago and recently underwent ABIs that were stable bilaterally with no changes.      Disposition: Follow-up with Belva Crome III, MD or APP in *** months {Are you ordering a CV Procedure (e.g. stress test, cath, DCCV, TEE, etc)?   Press F2        :  [id number]   Medication Adjustments/Labs and Tests Ordered: Current medicines are reviewed at length with the patient today.  Concerns regarding medicines are outlined above.   Signed, Mable Fill, Marissa Nestle, NP 12/19/2022, 8:56 AM [facility]  Note:  This document was prepared using Dragon voice recognition software and may include  unintentional dictation errors.

## 2022-12-20 ENCOUNTER — Ambulatory Visit: Payer: PPO | Attending: Nurse Practitioner | Admitting: Nurse Practitioner

## 2022-12-20 ENCOUNTER — Ambulatory Visit: Payer: PPO | Attending: Interventional Cardiology

## 2022-12-20 DIAGNOSIS — E785 Hyperlipidemia, unspecified: Secondary | ICD-10-CM

## 2022-12-20 DIAGNOSIS — G4733 Obstructive sleep apnea (adult) (pediatric): Secondary | ICD-10-CM

## 2022-12-20 DIAGNOSIS — I10 Essential (primary) hypertension: Secondary | ICD-10-CM

## 2022-12-20 DIAGNOSIS — I2510 Atherosclerotic heart disease of native coronary artery without angina pectoris: Secondary | ICD-10-CM

## 2022-12-20 DIAGNOSIS — I739 Peripheral vascular disease, unspecified: Secondary | ICD-10-CM

## 2023-01-07 ENCOUNTER — Ambulatory Visit: Payer: PPO | Admitting: Cardiovascular Disease

## 2023-02-18 ENCOUNTER — Ambulatory Visit: Payer: Medicare PPO | Admitting: Cardiovascular Disease

## 2023-02-26 ENCOUNTER — Other Ambulatory Visit: Payer: Self-pay

## 2023-02-26 MED ORDER — METOPROLOL SUCCINATE ER 50 MG PO TB24: 50.0000 mg | ORAL_TABLET | Freq: Every day | ORAL | 0 refills | Status: DC

## 2023-02-28 ENCOUNTER — Other Ambulatory Visit: Payer: Self-pay | Admitting: *Deleted

## 2023-02-28 DIAGNOSIS — Z720 Tobacco use: Secondary | ICD-10-CM

## 2023-02-28 DIAGNOSIS — E7849 Other hyperlipidemia: Secondary | ICD-10-CM

## 2023-02-28 DIAGNOSIS — F39 Unspecified mood [affective] disorder: Secondary | ICD-10-CM | POA: Diagnosis not present

## 2023-02-28 DIAGNOSIS — I2510 Atherosclerotic heart disease of native coronary artery without angina pectoris: Secondary | ICD-10-CM

## 2023-02-28 MED ORDER — ATORVASTATIN CALCIUM 80 MG PO TABS: 80.0000 mg | ORAL_TABLET | Freq: Every day | ORAL | 3 refills | Status: AC

## 2023-03-17 DIAGNOSIS — G4733 Obstructive sleep apnea (adult) (pediatric): Secondary | ICD-10-CM | POA: Diagnosis not present

## 2023-04-01 ENCOUNTER — Other Ambulatory Visit: Payer: Self-pay

## 2023-04-01 MED ORDER — EMPAGLIFLOZIN 10 MG PO TABS
10.0000 mg | ORAL_TABLET | Freq: Every day | ORAL | 1 refills | Status: AC
Start: 2023-04-01 — End: ?

## 2023-04-16 DIAGNOSIS — G4733 Obstructive sleep apnea (adult) (pediatric): Secondary | ICD-10-CM | POA: Diagnosis not present

## 2023-04-26 NOTE — Progress Notes (Deleted)
Cardiology Office Note:    Date:  04/26/2023   ID:  Joseph Fitzpatrick, DOB 08/16/1951, MRN [id number]  PCP:  Juluis Rainier, MD (Inactive)   [facility] Providers Cardiologist:  Lesleigh Noe, MD (Inactive) { Click to update primary MD,subspecialty MD or APP then REFRESH:1}    Referring MD: No ref. provider found   No chief complaint on file. ***  History of Present Illness:    Joseph Fitzpatrick is a 72 y.o. male is seen for follow up CAD. Former patient of Dr Verdis Prime. He was admitted for  NSTEMI in 2016 with OM1 felt to be the culprit vessel and this was successfully treated with a 2.25 x 32 mm Promus Premier drug-eluting stent.  Fractional flow reserve was measured across the RCA lesion and was normal at 0.93. His other issues include HTN, depression, tobacco abuse and HLD. He did have an abdominal aortogram in 06/2017 for claudication - found to have minimal disease in the aortoiliac arterial system and 50% stenosis in distal superfical femoral artery.  Followed by Dr Tresa Endo for OSA. Myoview in 2019 was low risk. Echo in April 2023 looked good. When last seen by Dr Katrinka Blazing in Dec BP was poorly controlled and Hyzaar and Toprol doses increased.   Past Medical History:  Diagnosis Date   Anxiety    Borderline diabetes    a. 08/2015 HbA1c = 5.9.   BPH (benign prostatic hypertrophy)    CAD (coronary artery disease)    a. 08/2015 NSTEMI/PCI: LM nl, LAD nl, RI nl, LCX 20p, OM1 95 (2.25x32 Pomus Premier DES), RCA 73m (FFR 0.93)->Med Rx, EF 55-65%.   Depression    Essential hypertension    GERD (gastroesophageal reflux disease)    Hematuria    History of duodenal ulcer    History of hiatal hernia    "suspected; never confirmed"   History of nephrolithiasis    History of pneumonia 1993 X 1   History of substance abuse (HCC)    Hypercholesterolemia    Hypokalemia    IBS (irritable bowel syndrome)    PVCs (premature ventricular contractions)    Tobacco abuse      Past Surgical History:  Procedure Laterality Date   ABDOMINAL AORTOGRAM W/LOWER EXTREMITY N/A 07/18/2017   Procedure: ABDOMINAL AORTOGRAM W/LOWER EXTREMITY;  Surgeon: Fransisco Hertz, MD;  [facility]: [facility] INVASIVE CV LAB;  Service: Cardiovascular;  Laterality: N/A;   CARDIAC CATHETERIZATION N/A 09/16/2015   Procedure: Left Heart Cath and Coronary Angiography;  Surgeon: Kalianne Fetting M [place], MD;  Location: [facility] INVASIVE CV LAB;  Service: Cardiovascular;  Laterality: N/A;   CARDIAC CATHETERIZATION  09/16/2015   Procedure: Intravascular Pressure Wire/FFR Study;  Surgeon: Colyn Miron M [place], MD;  [facility] INVASIVE CV LAB;  Service: Cardiovascular;;   CARDIAC CATHETERIZATION  09/16/2015   Procedure: Coronary Stent Intervention;  Surgeon: Remer Couse M [place], MD;  Location: [facility] INVASIVE CV LAB;  Service: Cardiovascular;;   CHOLECYSTECTOMY OPEN     TONSILLECTOMY      Current Medications: No outpatient medications have been marked as taking for the 04/29/23 encounter (Appointment) with [place], Charletta Voight M, MD.     Allergies:   Ibuprofen and Nsaids   Social History   Socioeconomic History   Marital status: Married    Spouse name: Not on file   Number of children: Not on file   Years of education: Not on file   Highest education level: Not on file  Occupational History   Not on file  Tobacco Use   Smoking status: Former    Packs/day: 0.25    Years: 47.00    Additional pack years: 0.00    Total pack years: 11.75    Types: Cigarettes    Start date: 02/20/1972    Quit date: 07/30/2018    Years since quitting: 4.7   Smokeless tobacco: Never  Vaping Use   Vaping Use: Never used  Substance and Sexual Activity   Alcohol use: Yes    Alcohol/week: 0.0 standard drinks of alcohol    Comment: Hx of EtOH abuse, quit in 1982   Drug use: Yes    Types: Marijuana    Comment: "smoked marijuana 40 years quit in ~ 2014, did a little of qthing in the 1970's for ~ 3 yrs"   Sexual activity: Not Currently  Other Topics  Concern   Not on file  Social History Narrative   Lives with his wife and 73 year old son.  Normally ambulates without assistive device, drives, completes ADLs without difficulty.     Social Determinants of Health   Financial Resource Strain: Not on file  Food Insecurity: Not on file  Transportation Needs: Not on file  Physical Activity: Not on file  Stress: Not on file  Social Connections: Not on file     Family History: The patient's ***family history includes Alzheimer's disease in his mother; Anxiety disorder in his mother; CAD in his father; Depression in his father; High blood pressure in his mother.  ROS:   Please see the history of present illness.    *** All other systems reviewed and are negative.  EKGs/Labs/Other Studies Reviewed:    The following studies were reviewed today: Cardiac cath 09/16/15: Procedures  Coronary Stent Intervention  Intravascular Pressure Wire/FFR Study  Left Heart Cath and Coronary Angiography   Conclusion  Prox Cx lesion, 20% stenosed. The left ventricular systolic function is normal. Mid RCA lesion, 60% stenosed. 1st Mrg lesion, 95% stenosed. Post intervention, there is a 0% residual stenosis.   1. Single vessel obstructive CAD involving the first OM 2. Borderline eccentric stenosis in the mid RCA with normal FFR of 0.93. 3. Normal LV function 4. Successful stenting of the first OM with a DES.   Plan: DAPT for one year. Aggressive risk factor modification. Anticipate DC in am.   Myoview 10/01/18: Study Highlights    Nuclear stress EF: 56%. Normal wall motion. There was no ST segment deviation noted during stress. Defect 1: There is a small defect of mild severity present in the apex location. No ischemia This is a low risk study. No ischemia identified.   Donato Schultz, MD  Echo 03/22/22: IMPRESSIONS     1. Left ventricular ejection fraction, by estimation, is 55%. The left  ventricle has normal function. The left ventricle  has no regional wall  motion abnormalities. There is mild left ventricular hypertrophy. Left  ventricular diastolic parameters are  consistent with Grade I diastolic dysfunction (impaired relaxation).   2. Right ventricular systolic function is normal. The right ventricular  size is normal. Tricuspid regurgitation signal is inadequate for assessing  PA pressure.   3. The mitral valve is normal in structure. No evidence of mitral valve  regurgitation. No evidence of mitral stenosis.   4. The aortic valve is tricuspid. There is mild calcification of the  aortic valve. Aortic valve regurgitation is not visualized. Aortic valve  sclerosis/calcification is present, without any evidence of aortic  stenosis.   5. The inferior vena cava is normal  in size with greater than 50%  respiratory variability, suggesting right atrial pressure of 3 mmHg   EKG:  EKG is *** ordered today.  The ekg ordered today demonstrates ***  Recent Labs: No results found for requested labs within last 365 days.  Recent Lipid Panel    Component Value Date/Time   CHOL 88 (L) 11/15/2015 0857   TRIG 54 11/15/2015 0857   HDL 41 11/15/2015 0857   CHOLHDL 2.1 11/15/2015 0857   VLDL 11 11/15/2015 0857   LDLCALC 36 11/15/2015 0857  Dated 03/22/22: Hgb 11.4. otherwise CBC and CMET normal.  Dated 10/24/22: cholesterol 187, triglycerides 298, HDL 50, LDL 58. A1c 6.7%.    Risk Assessment/Calculations:   {Does this patient have ATRIAL FIBRILLATION?:[id number]}  No BP recorded.  {Refresh Note OR Click here to enter BP  :1}***         Physical Exam:    VS:  There were no vitals taken for this visit.    Wt Readings from Last 3 Encounters:  10/30/22 220 lb 9.6 oz (100.1 kg)  09/05/22 235 lb 12.8 oz (107 kg)  04/26/22 224 lb 12.8 oz (102 kg)     GEN: *** Well nourished, well developed in no acute distress HEENT: Normal NECK: No JVD; No carotid bruits LYMPHATICS: No lymphadenopathy CARDIAC: ***RRR, no murmurs, rubs,  gallops RESPIRATORY:  Clear to auscultation without rales, wheezing or rhonchi  ABDOMEN: Soft, non-tender, non-distended MUSCULOSKELETAL:  No edema; No deformity  SKIN: Warm and dry NEUROLOGIC:  Alert and oriented x 3 PSYCHIATRIC:  Normal affect   ASSESSMENT:    No diagnosis found. PLAN:    In order of problems listed above:  ***      {Are you ordering a CV Procedure (e.g. stress test, cath, DCCV, TEE, etc)?   Press F2        :[id number]}    Medication Adjustments/Labs and Tests Ordered: Current medicines are reviewed at length with the patient today.  Concerns regarding medicines are outlined above.  No orders of the defined types were placed in this encounter.  No orders of the defined types were placed in this encounter.   There are no Patient Instructions on file for this visit.   Signed, Keyah Blizard [place], MD  04/26/2023 4:29 PM    [facility]

## 2023-04-29 ENCOUNTER — Ambulatory Visit: Payer: Medicare PPO | Admitting: Cardiology

## 2023-05-02 ENCOUNTER — Ambulatory Visit: Payer: Medicare PPO | Admitting: Nurse Practitioner

## 2023-05-17 DIAGNOSIS — G4733 Obstructive sleep apnea (adult) (pediatric): Secondary | ICD-10-CM | POA: Diagnosis not present

## 2023-05-23 NOTE — Progress Notes (Signed)
Cardiology Office Note:    Date:  05/27/2023   ID:  Joseph Fitzpatrick, DOB Sep 13, 1951, MRN [id number]  PCP:  No primary care provider on file.   [facility] Providers Cardiologist:  None     Referring MD: No ref. provider found   Chief Complaint  Patient presents with   Coronary Artery Disease    History of Present Illness:    Joseph Fitzpatrick is a 72 y.o. male is seen for follow up CAD. Former patient of Dr Verdis Prime. He was admitted for  NSTEMI in 2016 with OM1 felt to be the culprit vessel and this was successfully treated with a 2.25 x 32 mm Promus Premier drug-eluting stent.  Fractional flow reserve was measured across the RCA lesion and was normal at 0.93. His other issues include HTN, depression, tobacco abuse and HLD. He did have an abdominal aortogram in 06/2017 for claudication - found to have minimal disease in the aortoiliac arterial system and 50% stenosis in distal superfical femoral artery.  Followed by Dr Tresa Endo for OSA. Myoview in 2019 was low risk. Echo in April 2023 looked good. When last seen by Dr Katrinka Blazing in Dec BP was poorly controlled and Hyzaar and Toprol doses increased.   On follow up today he states he is doing OK. Denies any chest pain or SOB. Knees are bad and legs weak. This limits his activity. BP is consistently high. Looks like Dr Katrinka Blazing increased his losartan HCT last visit. States he runs a chronically low potassium.   Past Medical History:  Diagnosis Date   Anxiety    Borderline diabetes    a. 08/2015 HbA1c = 5.9.   BPH (benign prostatic hypertrophy)    CAD (coronary artery disease)    a. 08/2015 NSTEMI/PCI: LM nl, LAD nl, RI nl, LCX 20p, OM1 95 (2.25x32 Pomus Premier DES), RCA 1m (FFR 0.93)->Med Rx, EF 55-65%.   Depression    Essential hypertension    GERD (gastroesophageal reflux disease)    Hematuria    History of duodenal ulcer    History of hiatal hernia    "suspected; never confirmed"   History of nephrolithiasis    History of  pneumonia 1993 X 1   History of substance abuse (HCC)    Hypercholesterolemia    Hypokalemia    IBS (irritable bowel syndrome)    PVCs (premature ventricular contractions)    Tobacco abuse     Past Surgical History:  Procedure Laterality Date   ABDOMINAL AORTOGRAM W/LOWER EXTREMITY N/A 07/18/2017   Procedure: ABDOMINAL AORTOGRAM W/LOWER EXTREMITY;  Surgeon: Fransisco Hertz, MD;  Location: [facility] INVASIVE CV LAB;  Service: Cardiovascular;  Laterality: N/A;   CARDIAC CATHETERIZATION N/A 09/16/2015   Procedure: Left Heart Cath and Coronary Angiography;  Surgeon: Alece Koppel M [place], MD;  Location: [facility] INVASIVE CV LAB;  Service: Cardiovascular;  Laterality: N/A;   CARDIAC CATHETERIZATION  09/16/2015   Procedure: Intravascular Pressure Wire/FFR Study;  Surgeon: Jamila Slatten M [place], MD;  Location: [facility] INVASIVE CV LAB;  Service: Cardiovascular;;   CARDIAC CATHETERIZATION  09/16/2015   Procedure: Coronary Stent Intervention;  Surgeon: Leilanee Righetti M [place], MD;  Location: [facility] INVASIVE CV LAB;  Service: Cardiovascular;;   CHOLECYSTECTOMY OPEN     TONSILLECTOMY      Current Medications: Current Meds  Medication Sig   acetaminophen (TYLENOL) 325 MG tablet Take 2 tablets by mouth as needed.   amLODipine (NORVASC) 5 MG tablet TAKE 1 TABLET (5 MG TOTAL) BY MOUTH DAILY.   aspirin  EC 81 MG EC tablet Take 1 tablet (81 mg total) by mouth daily.   atorvastatin (LIPITOR) 80 MG tablet Take 1 tablet (80 mg total) by mouth daily.   clonazePAM (KLONOPIN) 1 MG tablet Take 1 mg by mouth 3 (three) times daily as needed for anxiety.   empagliflozin (JARDIANCE) 10 MG TABS tablet Take 1 tablet (10 mg total) by mouth daily before breakfast.   hydrOXYzine (VISTARIL) 25 MG capsule Take 25-75 mg by mouth 3 (three) times daily. Take 1 capsule (25mg ) at 0800,2 capsules (50mg ) at 1400, and 3 capsules (75 mg) at 2000.   Loratadine 10 MG CAPS Take 10 mg by mouth daily as needed (for allergies.).    metFORMIN (GLUCOPHAGE-XR) 500 MG 24 hr tablet 500  mg daily.   metoprolol succinate (TOPROL-XL) 50 MG 24 hr tablet Take 1 tablet (50 mg total) by mouth daily. Take with or immediately following a meal.   nitroGLYCERIN (NITROSTAT) 0.4 MG SL tablet Place 1 tablet (0.4 mg total) under the tongue every 5 (five) minutes x 3 doses as needed for chest pain.   Omega-3 Fatty Acids (FISH OIL) 1200 MG CAPS Take 1,200 mg by mouth 2 (two) times daily. (0800 & 2000)   pantoprazole (PROTONIX) 40 MG tablet Take 40 mg by mouth 2 (two) times daily.   PARoxetine (PAXIL) 20 MG tablet Take 20 mg by mouth daily. Taking with 40mg  to make 60mg  daily   PARoxetine (PAXIL) 40 MG tablet Take 40 mg by mouth daily. (0800) Taking with 20mg  to make 60mg  daily   Probiotic TBEC Take 1 capsule by mouth daily.   sucralfate (CARAFATE) 1 g tablet Take 1 g by mouth 3 (three) times daily.   tamsulosin (FLOMAX) 0.4 MG CAPS capsule Take 0.4 mg by mouth at bedtime.   TURMERIC PO Take 1 capsule by mouth 3 (three) times daily as needed (for inflammation.).   vitamin B-12 (CYANOCOBALAMIN) 1000 MCG tablet Take 1,000 mcg by mouth daily. (0800)   [DISCONTINUED] losartan-hydrochlorothiazide (HYZAAR) 100-25 MG tablet Take 1 tablet by mouth daily.   [DISCONTINUED] potassium chloride (KLOR-CON) 10 MEQ tablet Take 1 tablet (10 mEq total) by mouth daily.   [DISCONTINUED] spironolactone (ALDACTONE) 25 MG tablet Take 0.5 tablets (12.5 mg total) by mouth daily.     Allergies:   Ibuprofen and Nsaids   Social History   Socioeconomic History   Marital status: Married    Spouse name: Not on file   Number of children: Not on file   Years of education: Not on file   Highest education level: Not on file  Occupational History   Not on file  Tobacco Use   Smoking status: Former    Packs/day: 0.25    Years: 47.00    Additional pack years: 0.00    Total pack years: 11.75    Types: Cigarettes    Start date: 02/20/1972    Quit date: 07/30/2018    Years since quitting: 4.8   Smokeless tobacco: Never   Vaping Use   Vaping Use: Never used  Substance and Sexual Activity   Alcohol use: Yes    Alcohol/week: 0.0 standard drinks of alcohol    Comment: Hx of EtOH abuse, quit in 1982   Drug use: Yes    Types: Marijuana    Comment: "smoked marijuana 40 years quit in ~ 2014, did a little of qthing in the 1970's for ~ 3 yrs"   Sexual activity: Not Currently  Other Topics Concern   Not on  file  Social History Narrative   Lives with his wife and 58 year old son.  Normally ambulates without assistive device, drives, completes ADLs without difficulty.     Social Determinants of Health   Financial Resource Strain: Not on file  Food Insecurity: Not on file  Transportation Needs: Not on file  Physical Activity: Not on file  Stress: Not on file  Social Connections: Not on file     Family History: The patient's family history includes Alzheimer's disease in his mother; Anxiety disorder in his mother; CAD in his father; Depression in his father; High blood pressure in his mother.  ROS:   Please see the history of present illness.     All other systems reviewed and are negative.  EKGs/Labs/Other Studies Reviewed:    The following studies were reviewed today: Cardiac cath 09/16/15: Procedures  Coronary Stent Intervention  Intravascular Pressure Wire/FFR Study  Left Heart Cath and Coronary Angiography   Conclusion  Prox Cx lesion, 20% stenosed. The left ventricular systolic function is normal. Mid RCA lesion, 60% stenosed. 1st Mrg lesion, 95% stenosed. Post intervention, there is a 0% residual stenosis.   1. Single vessel obstructive CAD involving the first OM 2. Borderline eccentric stenosis in the mid RCA with normal FFR of 0.93. 3. Normal LV function 4. Successful stenting of the first OM with a DES.   Plan: DAPT for one year. Aggressive risk factor modification. Anticipate DC in am.   Myoview 10/01/18: Study Highlights    Nuclear stress EF: 56%. Normal wall motion. There was  no ST segment deviation noted during stress. Defect 1: There is a small defect of mild severity present in the apex location. No ischemia This is a low risk study. No ischemia identified.   Donato Schultz, MD  Echo 03/22/22: IMPRESSIONS     1. Left ventricular ejection fraction, by estimation, is 55%. The left  ventricle has normal function. The left ventricle has no regional wall  motion abnormalities. There is mild left ventricular hypertrophy. Left  ventricular diastolic parameters are  consistent with Grade I diastolic dysfunction (impaired relaxation).   2. Right ventricular systolic function is normal. The right ventricular  size is normal. Tricuspid regurgitation signal is inadequate for assessing  PA pressure.   3. The mitral valve is normal in structure. No evidence of mitral valve  regurgitation. No evidence of mitral stenosis.   4. The aortic valve is tricuspid. There is mild calcification of the  aortic valve. Aortic valve regurgitation is not visualized. Aortic valve  sclerosis/calcification is present, without any evidence of aortic  stenosis.   5. The inferior vena cava is normal in size with greater than 50%  respiratory variability, suggesting right atrial pressure of 3 mmHg   EKG:  EKG is not ordered today.  The ekg ordered today demonstrates N/A   Recent Labs: No results found for requested labs within last 365 days.  Recent Lipid Panel    Component Value Date/Time   CHOL 88 (L) 11/15/2015 0857   TRIG 54 11/15/2015 0857   HDL 41 11/15/2015 0857   CHOLHDL 2.1 11/15/2015 0857   VLDL 11 11/15/2015 0857   LDLCALC 36 11/15/2015 0857  Dated 03/22/22: Hgb 11.4. otherwise CBC and CMET normal.  Dated 10/24/22: cholesterol 187, triglycerides 298, HDL 50, LDL 58. A1c 6.7%.    Risk Assessment/Calculations:      HYPERTENSION CONTROL Vitals:   05/27/23 0930 05/27/23 1008  BP: (!) 142/92 (!) 160/80    The patient's blood pressure  is elevated above target  today.  In order to address the patient's elevated BP: A current anti-hypertensive medication was adjusted today.            Physical Exam:    VS:  BP (!) 160/80 (BP Location: Right Arm, Patient Position: Sitting)   Pulse 84   Ht 5\' 10"  (1.778 m)   Wt 229 lb 6.4 oz (104.1 kg)   SpO2 97%   BMI 32.92 kg/m     Wt Readings from Last 3 Encounters:  05/27/23 229 lb 6.4 oz (104.1 kg)  10/30/22 220 lb 9.6 oz (100.1 kg)  09/05/22 235 lb 12.8 oz (107 kg)     GEN:  Well nourished, well developed in no acute distress HEENT: Normal NECK: No JVD; No carotid bruits LYMPHATICS: No lymphadenopathy CARDIAC: RRR, no murmurs, rubs, gallops RESPIRATORY:  Clear to auscultation without rales, wheezing or rhonchi  ABDOMEN: Soft, non-tender, non-distended MUSCULOSKELETAL:  No edema; No deformity  SKIN: Warm and dry NEUROLOGIC:  Alert and oriented x 3 PSYCHIATRIC:  Normal affect   ASSESSMENT:    1. Coronary artery disease involving native coronary artery of native heart without angina pectoris   2. Hyperlipidemia with target LDL less than 70   3. Primary hypertension   4. Type 2 diabetes mellitus with complication, without long-term current use of insulin (HCC)    PLAN:    In order of problems listed above:  CAD with remote stenting of OM in 2016 in setting of NSTEMi. No active angina. Myoview 2019 normal. Continue ASA, metoprolol, amlodipine. Encourage lifestyle modification.  HTN. Not well controlled. Given history of hypokalemia I would if he has hyporenin/hypoaldosteronism. Will increase aldactone to 25 mg daily. Stop potassium supplement. Repeat labs next week. May benefit from higher aldactone dose if BP still not well controlled. HLD on statin. Encourage weight loss for high triglycerides. Diabetic control. Prior LDL at goal. Will update labs DM type 2 on Jardiance and metformin. Consider GLP 1 agonist to lower CV risk and control weight. Should discuss with PCP.            Medication Adjustments/Labs and Tests Ordered: Current medicines are reviewed at length with the patient today.  Concerns regarding medicines are outlined above.  Orders Placed This Encounter  Procedures   CBC w/Diff/Platelet   Comprehensive Metabolic Panel (CMET)   Lipid panel   HgB A1c   Meds ordered this encounter  Medications   spironolactone (ALDACTONE) 25 MG tablet    Sig: Take 1 tablet (25 mg total) by mouth daily.    Dispense:  90 tablet    Refill:  3   losartan-hydrochlorothiazide (HYZAAR) 100-25 MG tablet    Sig: Take 1 tablet by mouth daily.    Dispense:  90 tablet    Refill:  3    Dose change (increased)    Patient Instructions  Medication Instructions:  Stop taking Potassium Increase Aldactone to 25 mg daily Continue all other medications *If you need a refill on your cardiac medications before your next appointment, please call your pharmacy*   Lab Work: Have lab work in 1 week cmet,cbc,lipid,a1c   Testing/Procedures: None ordered   Follow-Up: At [facility], you and your health needs are our priority.  As part of our continuing mission to provide you with exceptional heart care, we have created designated Provider Care Teams.  These Care Teams include your primary Cardiologist (physician) and Advanced Practice Providers (APPs -  Physician Assistants and Nurse Practitioners) who  all work together to provide you with the care you need, when you need it.  We recommend signing up for the patient portal called "MyChart".  Sign up information is provided on this After Visit Summary.  MyChart is used to connect with patients for Virtual Visits (Telemedicine).  Patients are able to view lab/test results, encounter notes, upcoming appointments, etc.  Non-urgent messages can be sent to your provider as well.   To learn more about what you can do with MyChart, go to [email or web address].    Your next appointment:  6 months    Call in Sept to schedule  Jan appointment    Provider: Dr.Evertte Sones     Signed, Rosalin Buster [place], MD  05/27/2023 10:08 AM    [facility]

## 2023-05-26 ENCOUNTER — Other Ambulatory Visit: Payer: Self-pay | Admitting: Cardiology

## 2023-05-27 ENCOUNTER — Telehealth: Payer: Self-pay | Admitting: Cardiology

## 2023-05-27 ENCOUNTER — Ambulatory Visit: Payer: Medicare PPO | Attending: Cardiology | Admitting: Cardiology

## 2023-05-27 ENCOUNTER — Other Ambulatory Visit: Payer: Self-pay | Admitting: Cardiology

## 2023-05-27 ENCOUNTER — Encounter: Payer: Self-pay | Admitting: Cardiology

## 2023-05-27 VITALS — BP 160/80 | HR 84 | Ht 70.0 in | Wt 229.4 lb

## 2023-05-27 DIAGNOSIS — Z720 Tobacco use: Secondary | ICD-10-CM

## 2023-05-27 DIAGNOSIS — E118 Type 2 diabetes mellitus with unspecified complications: Secondary | ICD-10-CM

## 2023-05-27 DIAGNOSIS — I10 Essential (primary) hypertension: Secondary | ICD-10-CM | POA: Diagnosis not present

## 2023-05-27 DIAGNOSIS — I2510 Atherosclerotic heart disease of native coronary artery without angina pectoris: Secondary | ICD-10-CM

## 2023-05-27 DIAGNOSIS — E7849 Other hyperlipidemia: Secondary | ICD-10-CM

## 2023-05-27 DIAGNOSIS — E785 Hyperlipidemia, unspecified: Secondary | ICD-10-CM | POA: Diagnosis not present

## 2023-05-27 MED ORDER — LOSARTAN POTASSIUM-HCTZ 100-25 MG PO TABS: 1.0000 | ORAL_TABLET | Freq: Every day | ORAL | 3 refills | Status: DC

## 2023-05-27 MED ORDER — LOSARTAN POTASSIUM-HCTZ 100-25 MG PO TABS: 1.0000 | ORAL_TABLET | Freq: Every day | ORAL | 3 refills | Status: AC

## 2023-05-27 MED ORDER — SPIRONOLACTONE 25 MG PO TABS: 25.0000 mg | ORAL_TABLET | Freq: Every day | ORAL | 3 refills | Status: DC

## 2023-05-27 MED ORDER — AMLODIPINE BESYLATE 5 MG PO TABS
5.0000 mg | ORAL_TABLET | Freq: Every day | ORAL | 3 refills | Status: AC
Start: 2023-05-27 — End: ?

## 2023-05-27 NOTE — Telephone Encounter (Signed)
  Pt c/o medication issue:  1. Name of Medication: losartan-hydrochlorothiazide (HYZAAR) 100-25 MG tablet   2. How are you currently taking this medication (dosage and times per day)? Take 1 tablet by mouth daily.   3. Are you having a reaction (difficulty breathing--STAT)? No   4. What is your medication issue? Candise Bowens - Dr. Vergie Living office  calling, requesting to speak with Dr. Elvis Coil nurse regarding this medication

## 2023-05-27 NOTE — Telephone Encounter (Signed)
Patient is returning call and is requesting a return call.  

## 2023-05-27 NOTE — Telephone Encounter (Signed)
Jan called to clarify losartan-hydrochlorothiazide for patient.   Per AVS patient is to take losartan/hydrochlorothiazide 100-25 mg tablet daily.  Jan stated she will call patient.  I tried to reach patient. Left voicemail to return call to office.

## 2023-05-27 NOTE — Addendum Note (Signed)
Addended by: Neoma Laming on: 05/27/2023 01:52 PM   Modules accepted: Orders

## 2023-05-27 NOTE — Patient Instructions (Signed)
Medication Instructions:  Stop taking Potassium Increase Aldactone to 25 mg daily Continue all other medications *If you need a refill on your cardiac medications before your next appointment, please call your pharmacy*   Lab Work: Have lab work in 1 week cmet,cbc,lipid,a1c   Testing/Procedures: None ordered   Follow-Up: At [facility], you and your health needs are our priority.  As part of our continuing mission to provide you with exceptional heart care, we have created designated Provider Care Teams.  These Care Teams include your primary Cardiologist (physician) and Advanced Practice Providers (APPs -  Physician Assistants and Nurse Practitioners) who all work together to provide you with the care you need, when you need it.  We recommend signing up for the patient portal called "MyChart".  Sign up information is provided on this After Visit Summary.  MyChart is used to connect with patients for Virtual Visits (Telemedicine).  Patients are able to view lab/test results, encounter notes, upcoming appointments, etc.  Non-urgent messages can be sent to your provider as well.   To learn more about what you can do with MyChart, go to [email or web address].    Your next appointment:  6 months    Call in Sept to schedule Jan appointment    Provider: Dr.Jordan

## 2023-05-27 NOTE — Telephone Encounter (Addendum)
Spoke to the pt, he is currently taken losartan-hydrochlorothiazide and  wanted to clarify the frequency and dosage. According to the OV from today:    losartan-hydrochlorothiazide (HYZAAR) 100-25 MG tablet       Sig: Take 1 tablet by mouth daily.      Dispense:  90 tablet      Refill:  3      Dose change (increased)    Pt also want to clarify if he should continue Klor-Con, according to today's office pt:  Stop taking Potassium   Pt stated he was advised by his PCP to take 40 mg of Klor-Con. However, pt was advised by Dr. [place] to discontinue the medication. Pt stated he will follow Dr. Elvis Coil advise. Will forward to MD and nurse.

## 2023-05-28 DIAGNOSIS — G4733 Obstructive sleep apnea (adult) (pediatric): Secondary | ICD-10-CM | POA: Diagnosis not present

## 2023-06-16 DIAGNOSIS — G4733 Obstructive sleep apnea (adult) (pediatric): Secondary | ICD-10-CM | POA: Diagnosis not present

## 2023-06-19 ENCOUNTER — Ambulatory Visit: Payer: Medicare PPO | Admitting: Cardiovascular Disease

## 2023-06-19 DIAGNOSIS — G4733 Obstructive sleep apnea (adult) (pediatric): Secondary | ICD-10-CM

## 2023-07-02 DIAGNOSIS — E1159 Type 2 diabetes mellitus with other circulatory complications: Secondary | ICD-10-CM | POA: Diagnosis not present

## 2023-07-02 DIAGNOSIS — Z0000 Encounter for general adult medical examination without abnormal findings: Secondary | ICD-10-CM | POA: Diagnosis not present

## 2023-07-02 DIAGNOSIS — I10 Essential (primary) hypertension: Secondary | ICD-10-CM | POA: Diagnosis not present

## 2023-07-02 DIAGNOSIS — B829 Intestinal parasitism, unspecified: Secondary | ICD-10-CM | POA: Diagnosis not present

## 2023-07-02 DIAGNOSIS — Z122 Encounter for screening for malignant neoplasm of respiratory organs: Secondary | ICD-10-CM | POA: Diagnosis not present

## 2023-07-02 DIAGNOSIS — E785 Hyperlipidemia, unspecified: Secondary | ICD-10-CM | POA: Diagnosis not present

## 2023-07-02 DIAGNOSIS — E118 Type 2 diabetes mellitus with unspecified complications: Secondary | ICD-10-CM | POA: Diagnosis not present

## 2023-07-02 DIAGNOSIS — I2510 Atherosclerotic heart disease of native coronary artery without angina pectoris: Secondary | ICD-10-CM | POA: Diagnosis not present

## 2023-07-02 DIAGNOSIS — I700 Atherosclerosis of aorta: Secondary | ICD-10-CM | POA: Diagnosis not present

## 2023-07-02 DIAGNOSIS — E119 Type 2 diabetes mellitus without complications: Secondary | ICD-10-CM | POA: Diagnosis not present

## 2023-07-03 ENCOUNTER — Other Ambulatory Visit: Payer: Self-pay | Admitting: Family Medicine

## 2023-07-03 DIAGNOSIS — Z122 Encounter for screening for malignant neoplasm of respiratory organs: Secondary | ICD-10-CM

## 2023-07-03 LAB — CBC WITH DIFFERENTIAL/PLATELET
Basophils Absolute: 0.1 10*3/uL (ref 0.0–0.2)
Basos: 1 %
EOS (ABSOLUTE): 0.2 10*3/uL (ref 0.0–0.4)
Eos: 4 %
Hematocrit: 35.7 % — ABNORMAL LOW (ref 37.5–51.0)
Hemoglobin: 12.1 g/dL — ABNORMAL LOW (ref 13.0–17.7)
Immature Grans (Abs): 0 10*3/uL (ref 0.0–0.1)
Immature Granulocytes: 1 %
Lymphocytes Absolute: 2.3 10*3/uL (ref 0.7–3.1)
Lymphs: 36 %
MCH: 31.1 pg (ref 26.6–33.0)
MCHC: 33.9 g/dL (ref 31.5–35.7)
MCV: 92 fL (ref 79–97)
Monocytes Absolute: 0.4 10*3/uL (ref 0.1–0.9)
Monocytes: 7 %
Neutrophils Absolute: 3.3 10*3/uL (ref 1.4–7.0)
Neutrophils: 51 %
Platelets: 253 10*3/uL (ref 150–450)
RBC: 3.89 x10E6/uL — ABNORMAL LOW (ref 4.14–5.80)
RDW: 13.1 % (ref 11.6–15.4)
WBC: 6.3 10*3/uL (ref 3.4–10.8)

## 2023-07-03 LAB — COMPREHENSIVE METABOLIC PANEL WITH GFR
ALT: 18 IU/L (ref 0–44)
AST: 21 IU/L (ref 0–40)
Albumin: 4.5 g/dL (ref 3.8–4.8)
Alkaline Phosphatase: 71 IU/L (ref 44–121)
BUN/Creatinine Ratio: 17 (ref 10–24)
BUN: 20 mg/dL (ref 8–27)
Bilirubin Total: 0.5 mg/dL (ref 0.0–1.2)
CO2: 25 mmol/L (ref 20–29)
Calcium: 9.1 mg/dL (ref 8.6–10.2)
Chloride: 94 mmol/L — ABNORMAL LOW (ref 96–106)
Creatinine, Ser: 1.15 mg/dL (ref 0.76–1.27)
Globulin, Total: 2.6 g/dL (ref 1.5–4.5)
Glucose: 122 mg/dL — ABNORMAL HIGH (ref 70–99)
Potassium: 3.1 mmol/L — ABNORMAL LOW (ref 3.5–5.2)
Sodium: 137 mmol/L (ref 134–144)
Total Protein: 7.1 g/dL (ref 6.0–8.5)
eGFR: 68 mL/min/{1.73_m2} (ref >59)

## 2023-07-03 LAB — LIPID PANEL
Chol/HDL Ratio: 4.1 ratio (ref 0.0–5.0)
Cholesterol, Total: 206 mg/dL — ABNORMAL HIGH (ref 100–199)
HDL: 50 mg/dL (ref >39)
LDL Chol Calc (NIH): 114 mg/dL — ABNORMAL HIGH (ref 0–99)
Triglycerides: 244 mg/dL — ABNORMAL HIGH (ref 0–149)
VLDL Cholesterol Cal: 42 mg/dL — ABNORMAL HIGH (ref 5–40)

## 2023-07-03 LAB — HEMOGLOBIN A1C
Est. average glucose Bld gHb Est-mCnc: 151 mg/dL
Hgb A1c MFr Bld: 6.9 % — ABNORMAL HIGH (ref 4.8–5.6)

## 2023-07-11 ENCOUNTER — Telehealth: Payer: Self-pay | Admitting: Cardiology

## 2023-07-11 DIAGNOSIS — E785 Hyperlipidemia, unspecified: Secondary | ICD-10-CM

## 2023-07-11 DIAGNOSIS — I2510 Atherosclerotic heart disease of native coronary artery without angina pectoris: Secondary | ICD-10-CM

## 2023-07-11 DIAGNOSIS — I5032 Chronic diastolic (congestive) heart failure: Secondary | ICD-10-CM

## 2023-07-11 NOTE — Telephone Encounter (Signed)
Returned a call to pt for clarification. Asked pt which RN called him so I would know who to forward this to. He stated [facility] Kidney and his doctor is Dr. [place]. Will forward this to [place].

## 2023-07-11 NOTE — Telephone Encounter (Signed)
Patient returned RN's call. 

## 2023-07-11 NOTE — Telephone Encounter (Signed)
Left message for pt to call.

## 2023-07-16 DIAGNOSIS — E119 Type 2 diabetes mellitus without complications: Secondary | ICD-10-CM | POA: Diagnosis not present

## 2023-07-17 DIAGNOSIS — G4733 Obstructive sleep apnea (adult) (pediatric): Secondary | ICD-10-CM | POA: Diagnosis not present

## 2023-07-18 MED ORDER — SPIRONOLACTONE 50 MG PO TABS: 50.0000 mg | ORAL_TABLET | Freq: Every day | ORAL | 3 refills | Status: AC

## 2023-07-18 NOTE — Telephone Encounter (Signed)
Patient returned RN's call. 

## 2023-07-18 NOTE — Telephone Encounter (Signed)
Referral is for lipid management so yes still needs to see PharmD

## 2023-07-18 NOTE — Telephone Encounter (Signed)
Patient states his PCP just started him on Ozempic.  Would you still like him to set appointment with pharmacy or wiat until taken for a time??   Patient  given lab results.  Medication sent with new dose.  Labs ordered.  Patient states understanding of all information. Will send to scheduling after speaking with pharmacy regarding Ozempic

## 2023-07-18 NOTE — Telephone Encounter (Signed)
Please schedule

## 2023-07-26 ENCOUNTER — Inpatient Hospital Stay: Admission: RE | Admit: 2023-07-26 | Payer: Medicare PPO | Source: Ambulatory Visit

## 2023-07-30 ENCOUNTER — Other Ambulatory Visit: Payer: Self-pay

## 2023-07-30 DIAGNOSIS — I10 Essential (primary) hypertension: Secondary | ICD-10-CM

## 2023-07-30 DIAGNOSIS — E7800 Pure hypercholesterolemia, unspecified: Secondary | ICD-10-CM

## 2023-07-30 DIAGNOSIS — I2510 Atherosclerotic heart disease of native coronary artery without angina pectoris: Secondary | ICD-10-CM

## 2023-07-30 DIAGNOSIS — I5032 Chronic diastolic (congestive) heart failure: Secondary | ICD-10-CM

## 2023-08-13 ENCOUNTER — Ambulatory Visit
Admission: RE | Admit: 2023-08-13 | Discharge: 2023-08-13 | Disposition: A | Discharge status: Home / Self Care | Payer: Medicare PPO | Source: Ambulatory Visit | Attending: Family Medicine | Admitting: Family Medicine

## 2023-08-13 DIAGNOSIS — J439 Emphysema, unspecified: Secondary | ICD-10-CM | POA: Diagnosis not present

## 2023-08-13 DIAGNOSIS — I700 Atherosclerosis of aorta: Secondary | ICD-10-CM | POA: Diagnosis not present

## 2023-08-13 DIAGNOSIS — Z122 Encounter for screening for malignant neoplasm of respiratory organs: Secondary | ICD-10-CM

## 2023-08-13 DIAGNOSIS — Z87891 Personal history of nicotine dependence: Secondary | ICD-10-CM | POA: Diagnosis not present

## 2023-08-17 DIAGNOSIS — G4733 Obstructive sleep apnea (adult) (pediatric): Secondary | ICD-10-CM | POA: Diagnosis not present

## 2023-09-04 ENCOUNTER — Ambulatory Visit: Payer: Medicare PPO | Attending: Cardiovascular Disease | Admitting: Cardiovascular Disease

## 2023-09-05 ENCOUNTER — Encounter: Payer: Self-pay | Admitting: Cardiovascular Disease

## 2023-09-16 DIAGNOSIS — G4733 Obstructive sleep apnea (adult) (pediatric): Secondary | ICD-10-CM | POA: Diagnosis not present

## 2023-09-18 ENCOUNTER — Encounter: Payer: Self-pay | Admitting: Cardiovascular Disease

## 2023-09-18 ENCOUNTER — Ambulatory Visit: Payer: Medicare PPO | Attending: Cardiovascular Disease | Admitting: Cardiovascular Disease

## 2023-09-18 VITALS — BP 110/82 | HR 83 | Ht 70.0 in | Wt 220.0 lb

## 2023-09-18 DIAGNOSIS — I10 Essential (primary) hypertension: Secondary | ICD-10-CM

## 2023-09-18 NOTE — Progress Notes (Signed)
Cardiology Office Note    Date:  09/18/2023   ID:  Joseph Fitzpatrick, DOB 1951-08-05, MRN [id number]  PCP:  Jackelyn Poling, DO  Cardiologist:  Nicki Guadalajara, MD (sleep); Dr. Verdis Prime   One year follow-up follow-up sleep evaluation   History of Present Illness:  Joseph Fitzpatrick is a 72 y.o. male who is followed by Dr. Katrinka Blazing for cardiology care.  I had seen him in November 2017 for initial sleep evaluation.  He is now referred for new sleep evaluation stating that his machine is now making noises and is in need a new CPAP device.  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  When seen by Dr. Katrinka Blazing due to concerns for obstructive sleep apnea he was referred for a sleep study.  He underwent a diagnostic polysomnogram on 05/07/2016 which revealed mild obstructive sleep apnea overall with an AHI of 12.8 per hour; however, events were very severe with supine position with an HI of 98.8.  There was oxygen desaturated to a nadir of 85%.  He subsequent underwent a CPAP titration trial on 06/28/2016 and an initial pressure.  Recommendation was 13 cm water pressure.  He has [facility] [facility] at his [employer] as his DME provider.  His CPAP set up date was 08/13/2016.  He has a ResMed AirFit F20 medium size mask.  A download from 09/03/2016 through 10/02/2016  that he is meeting compliance with 87% of usage stays and 73% of usage days greater than 4 hours.  His AHI at 13 cm, however, was still slightly increased at 6.8.  Due to an apneic index of 6.0, and hypopnea index of 0.8.   When I saw for my initial evaluation in 2017 he denied any residual sleepiness, breakthrough snoring, and admitted that his sleep was much more restorative.  . An Epworth Sleepiness Scale score was calculated and  endorsed at 5, arguing against residual daytime sleepiness.  After not having seen Joseph Fitzpatrick since 2017 I saw him on 11/05/2022.  He was continuing to use his same CPAP machine but his machine recently has begun to make high-pitched noises and was on the verge of malfunction.   Presently, he goes to bed between midnight and 1 AM and often wakes up due to vivid dreams around 3 and 4 AM and often may stay awake until 8 and then sleeps for an additional 3 to 4 hours.  A download was obtained from September 11 through September 04, 2022.  He uses CPAP daily and due to him keeping his mask on for his entire duration it appears that he is wearing the mask for 11 hours and 40 minutes but this is not reflective of his sleep time since he believes he stays awake from 4 AM until 8 AM until he falls back to sleep.  His CPAP unit is set at a pressure range of 8 to 20 cm of water and AHI is 2.4/h with 95 percentile pressure 12.9 with maximum average pressure of 15.4.  He is now referred for new sleep evaluation.  An Epworth Sleepiness Scale score was calculated in the office  and this endorsed at 7 as shown below:  Epworth Sleepiness Scale: Situation   Chance of Dozing/Sleeping (0 = never , 1 = slight chance , 2 =  moderate chance , 3 = high chance )   sitting and reading 3   watching TV 3   sitting inactive in a public place 0   being a passenger in a motor vehicle for an hour or more 0   lying down in the afternoon 1   sitting and talking to someone 0   sitting quietly after lunch (no alcohol) 0   while stopped for a few minutes in traffic as the driver 0   Total Score  7    He denied any restless legs, bruxism, hypnopompic or hypnagogic hallucinations or cataplectic events.   I ordered a new CPAP machine for Joseph Fitzpatrick and he ultimately received a new ResMed AirSense 11 AutoSet unit on February 14, 2023 with [employer] as his DME company.  He has been using CPAP with 100% compliance since.  He continues to  have poor sleep hygiene.  He often stays up to midnight or 1 AM and wakes up between 6 and 7 AM.  He usually stays up for 2 to 4 hours and then typically goes back to sleep for an additional 4 hours and uses CPAP during his prolonged nap.  He feels well.  Download was obtained from September 22 through September 16, 2023.  Compliance is 100% use.  Combined use on a daily basis is 10 hours and 50 minutes.  CPAP is set at a pressure range of 10 to 20 cm, AHI is 2.2.  95th percentile pressure is 14.2 with maximum average pressure 15.8.  An Epworth scale was recalculated in the office today and this endorsed at 6 argue against residual daytime sleepiness.  He presents for follow-up evaluation.  Past Medical History:  Diagnosis Date   Anxiety    Borderline diabetes    a. 08/2015 HbA1c = 5.9.   BPH (benign prostatic hypertrophy)    CAD (coronary artery disease)    a. 08/2015 NSTEMI/PCI: LM nl, LAD nl, RI nl, LCX 20p, OM1 95 (2.25x32 Pomus Premier DES), RCA 49m (FFR 0.93)->Med Rx, EF 55-65%.   Depression    Essential hypertension    GERD (gastroesophageal reflux disease)    Hematuria    History of duodenal ulcer    History of hiatal hernia    "suspected; never confirmed"   History of nephrolithiasis    History of pneumonia 1993 X 1   History of substance abuse (HCC)    Hypercholesterolemia    Hypokalemia    IBS (irritable bowel syndrome)    PVCs (premature ventricular contractions)    Tobacco abuse     Past Surgical History:  Procedure Laterality Date   ABDOMINAL AORTOGRAM W/LOWER EXTREMITY N/A 07/18/2017   Procedure: ABDOMINAL AORTOGRAM W/LOWER EXTREMITY;  Surgeon: Fransisco Hertz, MD;  Location: [facility] INVASIVE CV LAB;  Service: Cardiovascular;  Laterality: N/A;   CARDIAC CATHETERIZATION N/A 09/16/2015   Procedure: Left Heart Cath and Coronary Angiography;  Surgeon: Peter M Swaziland, MD;  Location: [facility] INVASIVE CV LAB;  Service: Cardiovascular;  Laterality: N/A;   CARDIAC CATHETERIZATION  09/16/2015    Procedure: Intravascular Pressure Wire/FFR Study;  Surgeon: Peter M Swaziland, MD;  Location: [facility] INVASIVE CV LAB;  Service: Cardiovascular;;   CARDIAC CATHETERIZATION  09/16/2015   Procedure: Coronary Stent Intervention;  Surgeon: Peter M Swaziland, MD;  Location: [facility] INVASIVE CV LAB;  Service: Cardiovascular;;   CHOLECYSTECTOMY OPEN     TONSILLECTOMY      Current Medications: Outpatient Medications Prior to Visit  Medication Sig Dispense Refill  acetaminophen (TYLENOL) 325 MG tablet Take 2 tablets by mouth as needed.     amLODipine (NORVASC) 5 MG tablet Take 1 tablet (5 mg total) by mouth daily. 90 tablet 3   aspirin EC 81 MG EC tablet Take 1 tablet (81 mg total) by mouth daily.     atorvastatin (LIPITOR) 80 MG tablet Take 1 tablet (80 mg total) by mouth daily. 90 tablet 3   Cholecalciferol (VITAMIN D3) 50 MCG (2000 UT) capsule Take 2,000 Units by mouth daily.     clonazePAM (KLONOPIN) 1 MG tablet Take 1 mg by mouth 3 (three) times daily as needed for anxiety.     empagliflozin (JARDIANCE) 10 MG TABS tablet Take 1 tablet (10 mg total) by mouth daily before breakfast. 30 tablet 1   hydrOXYzine (ATARAX) 25 MG tablet Take 25 mg by mouth daily.     hydrOXYzine (VISTARIL) 25 MG capsule Take 25-75 mg by mouth 3 (three) times daily. Take 1 capsule (25mg ) at 0800,2 capsules (50mg ) at 1400, and 3 capsules (75 mg) at 2000.     Loratadine 10 MG CAPS Take 10 mg by mouth daily as needed (for allergies.).      losartan-hydrochlorothiazide (HYZAAR) 100-25 MG tablet Take 1 tablet by mouth daily. 90 tablet 3   metFORMIN (GLUCOPHAGE-XR) 500 MG 24 hr tablet 500 mg daily.     metoprolol succinate (TOPROL-XL) 50 MG 24 hr tablet Take 1 tablet (50 mg total) by mouth daily. TAKE WITH OR IMMEDIATELY FOLLOWING A MEAL. 90 tablet 3   Multiple Vitamins-Minerals (VITAMIN D3 COMPLETE PO) Take by mouth.     nitroGLYCERIN (NITROSTAT) 0.4 MG SL tablet Place 1 tablet (0.4 mg total) under the tongue every 5 (five) minutes x 3  doses as needed for chest pain. 25 tablet 3   Omega-3 Fatty Acids (FISH OIL) 1200 MG CAPS Take 1,200 mg by mouth 2 (two) times daily. (0800 & 2000)     OZEMPIC, 0.25 OR 0.5 MG/DOSE, 2 MG/3ML SOPN Inject into the skin once a week.     pantoprazole (PROTONIX) 40 MG tablet Take 40 mg by mouth 2 (two) times daily.     PARoxetine (PAXIL) 20 MG tablet Take 20 mg by mouth daily. Taking with 40mg  to make 60mg  daily     PARoxetine (PAXIL) 40 MG tablet Take 40 mg by mouth daily. (0800) Taking with 20mg  to make 60mg  daily  11   Probiotic TBEC Take 1 capsule by mouth daily.     spironolactone (ALDACTONE) 50 MG tablet Take 1 tablet (50 mg total) by mouth daily. 90 tablet 3   sucralfate (CARAFATE) 1 g tablet Take 1 g by mouth 3 (three) times daily.     tamsulosin (FLOMAX) 0.4 MG CAPS capsule Take 0.4 mg by mouth at bedtime.     TURMERIC PO Take 1 capsule by mouth 3 (three) times daily as needed (for inflammation.).     valACYclovir (VALTREX) 500 MG tablet Take 500 mg by mouth as needed.     vitamin B-12 (CYANOCOBALAMIN) 1000 MCG tablet Take 1,000 mcg by mouth daily. (0800)     No facility-administered medications prior to visit.     Allergies:   Ibuprofen and Nsaids   Social History   Socioeconomic History   Marital status: Married    Spouse name: Not on file   Number of children: Not on file   Years of education: Not on file   Highest education level: Not on file  Occupational History   Not  on file  Tobacco Use   Smoking status: Former    Current packs/day: 0.00    Average packs/day: 0.3 packs/day for 47.0 years (11.8 ttl pk-yrs)    Types: Cigarettes    Start date: 02/20/1972    Quit date: 07/30/2018    Years since quitting: 5.1   Smokeless tobacco: Never  Vaping Use   Vaping status: Never Used  Substance and Sexual Activity   Alcohol use: Yes    Alcohol/week: 0.0 standard drinks of alcohol    Comment: Hx of EtOH abuse, quit in 1982   Drug use: Yes    Types: Marijuana    Comment:  "smoked marijuana 40 years quit in ~ 2014, did a little of qthing in the 1970's for ~ 3 yrs"   Sexual activity: Not Currently  Other Topics Concern   Not on file  Social History Narrative   Lives with his wife and 63 year old son.  Normally ambulates without assistive device, drives, completes ADLs without difficulty.     Social Determinants of Health   Financial Resource Strain: Not on file  Food Insecurity: Not on file  Transportation Needs: Not on file  Physical Activity: Not on file  Stress: Not on file  Social Connections: Not on file    Socially he has been married for 36 years.  He has 2 children.  He had smoked 2 packs of cigarettes per day for 40 years but quit smoking in 2016.  He does vape.  He gets very little exercise  Family History:  The patient's family history includes Alzheimer's disease in his mother; Anxiety disorder in his mother; CAD in his father; Depression in his father; High blood pressure in his mother.  His mother died at age 46 and had dementia.  His father died at age 50 and had 2 stents placed in the 17s.  He has a daughter Rayfield Citizen age 66 and a son Luisa Hart age 72.  ROS General: Negative; No fevers, chills, or night sweats;  HEENT: Negative; No changes in vision or hearing, sinus congestion, difficulty swallowing Pulmonary: Negative; No cough, wheezing, shortness of breath, hemoptysis Cardiovascular: Hypertension, CAD status post stenting LCx. GI: GERD GU: Negative; No dysuria, hematuria, or difficulty voiding Musculoskeletal: Negative; no myalgias, joint pain, or weakness Hematologic/Oncology: Negative; no easy bruising, bleeding Endocrine: Negative; no heat/cold intolerance; no diabetes Neuro: Negative; no changes in balance, headaches Skin: Negative; No rashes or skin lesions Psychiatric: Anxiety/depression on paroxetine and clonazepam. Sleep: See HPI  New Epworth Sleepiness Scale was calculated in the office today and this endorsed at 6 argue  against digital daytime sleepiness.  Other comprehensive 14 point system review is negative.   PHYSICAL EXAM:   VS:  BP 110/82 (BP Location: Left Arm, Patient Position: Sitting, Cuff Size: Normal)   Pulse 83   Ht 5\' 10"  (1.778 m)   Wt 220 lb (99.8 kg)   SpO2 94%   BMI 31.57 kg/m     Repeat blood pressure by me was 120/78  Wt Readings from Last 3 Encounters:  09/18/23 220 lb (99.8 kg)  05/27/23 229 lb 6.4 oz (104.1 kg)  10/30/22 220 lb 9.6 oz (100.1 kg)    General: Alert, oriented, no distress.  Skin: normal turgor, no rashes, warm and dry HEENT: Normocephalic, atraumatic. Pupils equal round and reactive to light; sclera anicteric; extraocular muscles intact; Bearded Nose without nasal septal hypertrophy Mouth/Parynx benign; Mallinpatti scale 3/4 Neck: No JVD, no carotid bruits; normal carotid upstroke Lungs: clear to ausculatation and  percussion; no wheezing or rales Chest wall: without tenderness to palpitation Heart: PMI not displaced, RRR, s1 s2 normal, 1/6 systolic murmur, no diastolic murmur, no rubs, gallops, thrills, or heaves Abdomen: soft, nontender; no hepatosplenomehaly, BS+; abdominal aorta nontender and not dilated by palpation. Back: no CVA tenderness Pulses 2+ Musculoskeletal: full range of motion, normal strength, no joint deformities Extremities: no clubbing cyanosis or edema, Homan's sign negative  Neurologic: grossly nonfocal; Cranial nerves grossly wnl Psychologic: Normal mood and affect   Studies/Labs Reviewed:   EKG Interpretation Date/Time:  Wednesday September 18 2023 16:37:18 EDT Ventricular Rate:  83 PR Interval:  162 QRS Duration:  84 QT Interval:  424 QTC Calculation: 498 R Axis:   54  Text Interpretation: Normal sinus rhythm Anterolateral infarct (cited on or before 01-Oct-2018) When compared with ECG of 01-Oct-2018 11:25, Vent. rate has increased BY  28 BPM Questionable change in initial forces of Lateral leads Non-specific change in ST  segment in Lateral leads T wave inversion more evident in Inferior leads T wave inversion now evident in Lateral leads Confirmed by Nicki Guadalajara ([id number]) on 09/18/2023 5:09:40 PM    September 05, 2022 ECG (independently read by me):NSR at 68, QS V1-2, QTc 467   Recent Labs:    Latest Ref Rng & Units 07/02/2023    8:59 AM 04/11/2022    1:32 PM 03/22/2022    1:01 PM  BMP  Glucose 70 - 99 mg/dL 604  540  981   BUN 8 - 27 mg/dL 20  16  13    Creatinine 0.76 - 1.27 mg/dL 1.91  4.78  2.95   BUN/Creat Ratio 10 - 24 17  15  10    Sodium 134 - 144 mmol/L 137  138  141   Potassium 3.5 - 5.2 mmol/L 3.1  3.7  3.3   Chloride 96 - 106 mmol/L 94  99  99   CO2 20 - 29 mmol/L 25  20  21    Calcium 8.6 - 10.2 mg/dL 9.1  9.3  9.4         Latest Ref Rng & Units 07/02/2023    8:59 AM 11/15/2015    8:57 AM 02/20/2013    5:00 AM  Hepatic Function  Total Protein 6.0 - 8.5 g/dL 7.1  7.4  7.0   Albumin 3.8 - 4.8 g/dL 4.5  4.4  3.4   AST 0 - 40 IU/L 21  17  16    ALT 0 - 44 IU/L 18  17  18    Alk Phosphatase 44 - 121 IU/L 71  58  60   Total Bilirubin 0.0 - 1.2 mg/dL 0.5  0.4  0.5   Bilirubin, Direct <=0.2 mg/dL  0.2         Latest Ref Rng & Units 07/02/2023    8:59 AM 09/29/2018   11:07 AM 07/18/2017   10:45 AM  CBC  WBC 3.4 - 10.8 x10E3/uL 6.3  7.5    Hemoglobin 13.0 - 17.7 g/dL 62.1  30.8  65.7   Hematocrit 37.5 - 51.0 % 35.7  42.0  39.0   Platelets 150 - 450 x10E3/uL 253  234     Lab Results  Component Value Date   MCV 92 07/02/2023   MCV 92 09/29/2018   MCV 90.4 11/15/2015   Lab Results  Component Value Date   TSH 1.899 02/19/2013   Lab Results  Component Value Date   HGBA1C 6.9 (H) 07/02/2023     BNP No results found  for: "BNP"  ProBNP    Component Value Date/Time   PROBNP 325 03/22/2022 1301     Lipid Panel     Component Value Date/Time   CHOL 206 (H) 07/02/2023 0859   TRIG 244 (H) 07/02/2023 0859   HDL 50 07/02/2023 0859   CHOLHDL 4.1 07/02/2023 0859   CHOLHDL 2.1  11/15/2015 0857   VLDL 11 11/15/2015 0857   LDLCALC 114 (H) 07/02/2023 0859   LABVLDL 42 (H) 07/02/2023 0859     RADIOLOGY: No results found.   Additional studies/ records that were reviewed today include:    Patient Name: Joseph Fitzpatrick, Joseph Fitzpatrick Date: 05/07/2016 Gender: Male D.O.B: Nov 06, 1951 Age (years): 64 Referring Provider: Verdis Prime Height (inches): 71 Interpreting Physician: Nicki Guadalajara MD, ABSM Weight (lbs): 205 RPSGT: Ulyess Mort BMI: 29 MRN: [id number] Neck Size: 17.00   CLINICAL INFORMATION Sleep Study Type: NPSG Indication for sleep study: Diabetes, Excessive Daytime Sleepiness, Hypertension, OSA, Snoring Epworth Sleepiness Score: 9   SLEEP STUDY TECHNIQUE As per the AASM Manual for the Scoring of Sleep and Associated Events v2.3 (April 2016) with a hypopnea requiring 4% desaturations. The channels recorded and monitored were frontal, central and occipital EEG, electrooculogram (EOG), submentalis EMG (chin), nasal and oral airflow, thoracic and abdominal wall motion, anterior tibialis EMG, snore microphone, electrocardiogram, and pulse oximetry.   MEDICATIONS  vitamin B-12 (CYANOCOBALAMIN) 100 MCG tablet 100 mcg, Daily     ticagrelor (BRILINTA) 90 MG TABS tablet 90 mg, 2 times daily     saw palmetto 160 MG capsule 160 mg, 3 times daily     PARoxetine (PAXIL) 40 MG tablet 40 mg, Every evening     Note: Received from: External Pharmacy (Written 09/15/2015 1125)   Omega-3 Fatty Acids (FISH OIL) 1000 MG CAPS 2 capsule, Daily     nitroGLYCERIN (NITROSTAT) 0.4 MG SL tablet 0.4 mg, Every 5 min x3 PRN     losartan-hydrochlorothiazide (HYZAAR) 50-12.5 MG per tablet 1 tablet, Daily     Note: Received from: External Pharmacy (Written 01/05/2014 1434)   Loratadine (CLARITIN) 10 MG CAPS 10 mg, Daily     KLOR-CON M10 10 MEQ tablet 10 mEq, Daily     Note: Received from: External Pharmacy (Written 09/15/2015 1125)   hydrOXYzine (VISTARIL) 25 MG capsule 25-75 mg, 3  times daily     Note: Received from: External Pharmacy (Written 01/05/2014 1434)  onazePAM (KLONOPIN) 1 MG tablet 1-2 mg, 2 times daily     Note: Received from: External Pharmacy (Written 01/05/2014 1434)   atorvastatin (LIPITOR) 80 MG tablet 80 mg, Daily-1800     aspirin EC 81 MG EC tablet 81 mg, Daily     amLODipine (NORVASC) 5 MG tablet 5 mg, Daily    Medications self-administered by patient during sleep study : No sleep medicine administered.   SLEEP ARCHITECTURE The study was initiated at 10:09:12 PM and ended at 4:49:28 AM. Sleep onset time was 37.8 minutes and the sleep efficiency was 60.8%. The total sleep time was 243.5 minutes. Wake after sleep onset (WASO) was 118.9 minutes Stage REM latency was 144.5 minutes. The patient spent 33.06% of the night in stage N1 sleep, 55.03% in stage N2 sleep, 0.00% in stage N3 and 11.91% in REM. Alpha intrusion was absent. Supine sleep was 10.47%.   RESPIRATORY PARAMETERS The overall apnea/hypopnea index (AHI) was 12.8 per hour. There were 30 total apneas, including 8 obstructive, 6 central and 16 mixed apneas. There were 22 hypopneas and 5 RERAs. The AHI during Stage  REM sleep was 2.1 per hour. AHI while supine was 98.8 per hour. The mean oxygen saturation was 93.36%. The minimum SpO2 during sleep was 85.00%. Soft snoring was noted during this study.   CARDIAC DATA The 2 lead EKG demonstrated sinus rhythm. The mean heart rate was 63.27 beats per minute. Other EKG findings include: None.   LEG MOVEMENT DATA The total PLMS were 404 with a resulting PLMS index of 99.55. Associated arousal with leg movement index was 5.2 .   IMPRESSIONS - Mild obstructive sleep apnea occurred during this study (AHI = 12.8/h); events were severe with supine position (AHI 98.8/h). - No significant central sleep apnea occurred during this study (CAI = 1.5/h). - Mild oxygen desaturation  to a nadir of 85.00%. - Reduced sleep efficiency at 60.8%. - Abnormal sleep  architecture with absence of slow-wave sleep and prolonged latency to a REM sleep. - The patient snored with Soft snoring volume. - No cardiac abnormalities were noted during this study. - Severe periodic limb movements of sleep occurred during the study. Associated arousals were significant. - The arousal index was abnormal   DIAGNOSIS - Obstructive Sleep Apnea (327.23 [G47.33 ICD-10])   RECOMMENDATIONS - Recommend therapeutic CPAP titration to determine optimal pressure required to alleviate sleep disordered breathing. - Efforts should be made to optimize nasal and oropharyngeal patency. - The patient should be counseled to avoid supine sleep; consider positional therapy avoiding supine position during sleep. - Avoid alcohol, sedatives and other CNS depressants that may worsen sleep apnea and disrupt normal sleep architecture. - Sleep hygiene should be reviewed to assess factors that may improve sleep quality. - Weight management and regular exercise should be initiated or continued if appropriate.  ----------------------------------------------------------------------------------------------   06/28/2016: CLINICAL INFORMATION The patient is referred for a CPAP titration to treat sleep apnea.   Date of NPSG, Split Night or HST:  05/07/2016  AHI 12.8/h; RDI 14.0/h    SLEEP STUDY TECHNIQUE As per the SM Manual for the Scoring of Sleep and Associated Events v2.3 (April 2016) with a hypopnea requiring 4% desaturations. The channels recorded and monitored were frontal, central and occipital EEG, electrooculogram (EOG), submentalis EMG (chin), nasal and oral airflow, thoracic and abdominal wall motion, anterior tibialis EMG, snore microphone, electrocardiogram, and pulse oximetry. Continuous positive airway pressure (CPAP) was initiated at the beginning of the study and titrated to treat sleep-disordered breathing.   MEDICATIONS amLODipine (NORVASC) 5 MG tablet Taking 02/03/16 -- Rosalio Macadamia, NP Take 1 tablet (5 mg total) by mouth daily. aspirin EC 81 MG EC tablet Taking 09/17/15 -- Ok Anis, NP Take 1 tablet (81 mg total) by mouth daily. atorvastatin (LIPITOR) 80 MG tablet 02/27/16 -- Lyn Records, MD Take 1 tablet (80 mg total) by mouth daily at 6 PM. clonazePAM (KLONOPIN) 1 MG tablet Taking 12/05/13 -- Historical Provider, MD Notes:  Received from: External Pharmacy hydrOXYzine (VISTARIL) 25 MG capsule Taking 01/02/14 -- Historical Provider, MD Notes:  Received from: External Pharmacy KLOR-CON M10 10 MEQ tablet Taking 09/06/15 -- Historical Provider, MD Notes:  Received from: External Pharmacy Loratadine (CLARITIN) 10 MG CAPS Taking -- -- Historical Provider, MD losartan-hydrochlorothiazide Mauri Reading) 50-12.5 MG per tablet Taking 10/10/13 -- Historical Provider, MD Notes:  Received from: External Pharmacy nitroGLYCERIN (NITROSTAT) 0.4 MG SL tablet Taking 09/17/15 -- Ok Anis, NP Place 1 tablet (0.4 mg total) under the tongue every 5 (five) minutes x 3 doses as needed for chest pain. Omega-3 Fatty Acids (FISH OIL) 1000 MG  CAPS Taking -- -- Historical Provider, MD PARoxetine (PAXIL) 40 MG tablet Taking 08/28/15 -- Historical Provider, MD Notes:  Received from: External Pharmacy saw palmetto 160 MG capsule Taking -- -- Historical Provider, MD ticagrelor (BRILINTA) 90 MG TABS tablet Taking 02/13/16 -- Ok Anis, NP Take 1 tablet (90 mg total) by mouth 2 (two) times daily. vitamin B-12 (CYANOCOBALAMIN) 100 MCG tablet   Medications administered by patient during sleep study : No sleep medicine administered.   TECHNICIAN COMMENTS Comments added by technician: none  Comments added by scorer: N/A   RESPIRATORY PARAMETERS Optimal PAP Pressure (cm):  13        AHI at Optimal Pressure (/hr):            0.0 Overall Minimal O2 (%):         89.00   Supine % at Optimal Pressure (%):    0 Minimal O2 at Optimal Pressure (%): 92.0                    SL EEP ARCHITECTURE The study was initiated at 10:43:01 PM and ended at 5:14:37 AM. Sleep onset time was 35.7 minutes and the sleep efficiency was 82.2%. The total sleep time was 322.0 minutes. Wakr after sleep onset (WASO) was 33.9 minutes. The patient spent 12.58% of the night in stage N1 sleep, 79.97% in stage N2 sleep, 0.00% in stage N3 and 7.45% in REM.Stage REM latency was 240.0 minutes Wake after sleep onset was 33.9. Alpha intrusion was absent. Supine sleep was 42.55%.   CARDIAC DATA The 2 lead EKG demonstrated sinus rhythm. The mean heart rate was 64.22 beats per minute. Other EKG findings include: None.   LEG MOVEMENT DATA The total Periodic Limb Movements of Sleep (PLMS) were 64. The PLMS index was 11.93. A PLMS index of <15 is considered normal in adults.   IMPRESSIONS - The optimal PAP pressure was 13 cm of water. - Central sleep apnea was not noted during this titration (CAI = 1.7/h). - Mild oxygen desaturations were observed during this titration (min O2 = 89.00%). - The patient snored with Soft snoring volume during this titration study with resolution at 13 cm water presssure. - No cardiac abnormalities were observed during this study. - Mild periodic limb movements were observed during this study. Arousals associated with PLMs were rare.   DIAGNOSIS - Obstructive Sleep Apnea (327.23 [G47.33 ICD-10])   RECOMMENDATIONS - Recommend an initial trial of CPAP therapy with EPR of 3 at 13 cm H2O with heated humidification.  A Medium size Resmed Full Face Mask AirFit F20 mask was ued for the titration. - Avoid alcohol, sedatives and other CNS depressants that may worsen sleep apnea and disrupt normal sleep architecture. - Sleep hygiene should be reviewed to assess factors that may improve sleep quality. - Weight management and regular exercise should be initiated or continued. - Recommend a download be obtained in 30 days and sleep clinic evaluation.    ASSESSMENT:     1. Essential hypertension     PLAN:  Joseph Fitzpatrick is a 72 year old gentleman who has established cardiovascular comorbidities including CAD, status post stenting of his left circumflex coronary artery following a non-STEMI in 2016, as well as a history of hypertension, and significant prior tobacco use having quit cigarettes in 2016 but does vape.  Due to concerns for obstructive sleep apnea he underwent a sleep evaluation in 2017 and was found to have mild overall sleep apnea with an AHI  of 12.8/h however events were very severe with supine position with an AHI of 98.8/h.  He has been on CPAP therapy since his set up date on August 13, 2016 and has a ResMed air sense 10 CPAP AutoSet unit.  When I saw him in October 2023, his machine was beginning to malfunction.  I scheduled him to receive a new ResMed AirSense 11 AutoSet unit which he received on February 14, 2023 with [employer] as his DME company.  I obtained a download today.  He is meeting compliance standards.  He needs to have poor sleep habits and typically goes to bed between midnight and 1 AM and wakes up between 6 and 7 AM.  As result he is often only sleeping 5 hours at night and therefore has to sleep another 4 hours in the afternoon.  He has used CPAP in during every nap.  His most recent download shows 100% compliance with average use per day at 10 hours and 50 minutes.  AHI is 2.2 and his 95th percentile pressure is 14.2 with maximum average pressure of 15.8.  Epworth scale endorsed at 6 argue against residual daytime sleepiness.  I have recommended that Joseph Fitzpatrick and adjust his sleep pattern.  I have recommended he at least go to bed by 10:00 and give himself at least 8 hours of continuous sleep at night and this way he will not need to long nap in the afternoon and his productivity can be significantly increased on a daily basis.  His blood pressure today is stable and on repeat by me was 120/70.  He now sees Dr. Swaziland  since Dr. Garnette Scheuermann has retired.  He continues to be on pain 5 mg, losartan HCT 50/12.5 mg, and spironolactone in addition to metoprolol with his CAD and hypertension.  He is diabetic on Jardiance in addition to med Delight.  He takes paroxetine and clonazepam for anxiety/depression.  He continues to be on atorvastatin 80 mg for aggressive lipid management now followed by Dr. Swaziland and his primary physician.  He is stable from a sleep perspective and hopefully he will adjust his sleep habits as noted above.  I will be available to see him as needed in the future or problems arise but I discussed with him the plans for future retirement in the next year.   Medication Adjustments/Labs and Tests Ordered: Current medicines are reviewed at length with the patient today.  Concerns regarding medicines are outlined above.  Medication changes, Labs and Tests ordered today are listed in the Patient Instructions below. There are no Patient Instructions on file for this visit.   Signed, Nicki Guadalajara, MD,FACC, ABSM Diplomate, [place] Board [employer] Sleep [employer]  09/18/2023 4:54 PM    [facility] [address], [place], [place]  [address] Phone: [phone]

## 2023-09-18 NOTE — Patient Instructions (Addendum)
Medication Instructions:  *If you need a refill on your cardiac medications before your next appointment, please call your pharmacy*   Lab Work: If you have labs (blood work) drawn today and your tests are completely normal, you will receive your results only by: MyChart Message (if you have MyChart) OR A paper copy in the mail If you have any lab test that is abnormal or we need to change your treatment, we will call you to review the results.  Follow-Up: At [facility], you and your health needs are our priority.  As part of our continuing mission to provide you with exceptional heart care, we have created designated Provider Care Teams.  These Care Teams include your primary Cardiologist (physician) and Advanced Practice Providers (APPs -  Physician Assistants and Nurse Practitioners) who all work together to provide you with the care you need, when you need it.  We recommend signing up for the patient portal called "MyChart".  Sign up information is provided on this After Visit Summary.  MyChart is used to connect with patients for Virtual Visits (Telemedicine).  Patients are able to view lab/test results, encounter notes, upcoming appointments, etc.  Non-urgent messages can be sent to your provider as well.   To learn more about what you can do with MyChart, go to [email or web address].    Your next appointment:  as needed for sleep care    Provider:   Dr. Nicki Guadalajara     Other Instructions You may call Johnnye Sima for any sleep care concerns at [phone]

## 2023-10-16 DIAGNOSIS — F410 Panic disorder [episodic paroxysmal anxiety] without agoraphobia: Secondary | ICD-10-CM | POA: Diagnosis not present

## 2023-10-16 DIAGNOSIS — F39 Unspecified mood [affective] disorder: Secondary | ICD-10-CM | POA: Diagnosis not present

## 2023-10-17 DIAGNOSIS — G4733 Obstructive sleep apnea (adult) (pediatric): Secondary | ICD-10-CM | POA: Diagnosis not present

## 2023-11-16 DIAGNOSIS — G4733 Obstructive sleep apnea (adult) (pediatric): Secondary | ICD-10-CM | POA: Diagnosis not present

## 2023-11-27 NOTE — Progress Notes (Signed)
 Cardiology Office Note:    Date:  12/05/2023   ID:  Joseph Fitzpatrick, DOB [birth year], MRN [id number]  PCP:  Dayna Motto, DO   [facility] Providers Cardiologist:  None     Referring MD: Dayna Motto, DO   No chief complaint on file.   History of Present Illness:    Joseph Fitzpatrick is a 73 y.o. male is seen for follow up CAD. Former patient of Dr Victory Sharps. He was admitted for  NSTEMI in 2016 with OM1 felt to be the culprit vessel and this was successfully treated with a 2.25 x 32 mm Promus Premier drug-eluting stent.  Fractional flow reserve was measured across the RCA lesion and was normal at 0.93. His other issues include HTN, depression, tobacco abuse and HLD. He did have an abdominal aortogram in 06/2017 for claudication - found to have minimal disease in the aortoiliac arterial system and 50% stenosis in distal superfical femoral artery.  Followed by Dr Burnard for OSA. Myoview  in 2019 was low risk. Echo in April 2023 looked good.   On follow up today he states he is doing OK. Denies any chest pain or SOB. Knees are bad and legs weak. This limits his activity. BP is well controlled now.   Past Medical History:  Diagnosis Date   Anxiety    Borderline diabetes    a. 08/2015 HbA1c = 5.9.   BPH (benign prostatic hypertrophy)    CAD (coronary artery disease)    a. 08/2015 NSTEMI/PCI: LM nl, LAD nl, RI nl, LCX 20p, OM1 95 (2.25x32 Pomus Premier DES), RCA 82m (FFR 0.93)->Med Rx, EF 55-65%.   Depression    Essential hypertension    GERD (gastroesophageal reflux disease)    Hematuria    History of duodenal ulcer    History of hiatal hernia    suspected; never confirmed   History of nephrolithiasis    History of pneumonia 1993 X 1   History of substance abuse (HCC)    Hypercholesterolemia    Hypokalemia    IBS (irritable bowel syndrome)    PVCs (premature ventricular contractions)    Tobacco abuse     Past Surgical History:  Procedure Laterality Date    ABDOMINAL AORTOGRAM W/LOWER EXTREMITY N/A 07/18/2017   Procedure: ABDOMINAL AORTOGRAM W/LOWER EXTREMITY;  Surgeon: Laurence Redell CROME, MD;  Location[facility] INVASIVE CV LAB;  Service: Cardiovascular;  Laterality: N/A;   CARDIAC CATHETERIZATION N/A 09/16/2015   Procedure: Left Heart Cath and Coronary Angiography;  Surgeon: Valley Ke M Sole Lengacher, MD;  Location: [facility] INVASIVE CV LAB;  Service: Cardiovascular;  Laterality: N/A;   CARDIAC CATHETERIZATION  09/16/2015   Procedure: Intravascular Pressure Wire/FFR Study;  Surgeon: Rashaun Curl M Cledis Sohn, MD;  Location: [facility] INVASIVE CV LAB;  Service: Cardiovascular;;   CARDIAC CATHETERIZATION  09/16/2015   Procedure: Coronary Stent Intervention;  Surgeon: Sophina Mitten M Alida Greiner, MD;  Location: [facility] INVASIVE CV LAB;  Service: Cardiovascular;;   CHOLECYSTECTOMY OPEN     TONSILLECTOMY      Current Medications: Current Meds  Medication Sig   acetaminophen  (TYLENOL ) 325 MG tablet Take 2 tablets by mouth as needed.   amLODipine  (NORVASC ) 5 MG tablet Take 1 tablet (5 mg total) by mouth daily.   aspirin  EC 81 MG EC tablet Take 1 tablet (81 mg total) by mouth daily.   atorvastatin  (LIPITOR ) 80 MG tablet Take 1 tablet (80 mg total) by mouth daily.   Cholecalciferol (VITAMIN D3) 50 MCG (2000 UT) capsule Take 2,000 Units by mouth  daily.   clonazePAM  (KLONOPIN ) 1 MG tablet Take 1 mg by mouth 3 (three) times daily as needed for anxiety.   empagliflozin  (JARDIANCE ) 10 MG TABS tablet Take 1 tablet (10 mg total) by mouth daily before breakfast.   hydrOXYzine  (ATARAX ) 25 MG tablet Take 25 mg by mouth daily.   hydrOXYzine  (VISTARIL ) 25 MG capsule Take 25-75 mg by mouth 3 (three) times daily. Take 1 capsule (25mg ) at 0800,2 capsules (50mg ) at 1400, and 3 capsules (75 mg) at 2000.   Loratadine  10 MG CAPS Take 10 mg by mouth daily as needed (for allergies.).    losartan -hydrochlorothiazide  (HYZAAR) 100-25 MG tablet Take 1 tablet by mouth daily.   metFORMIN (GLUCOPHAGE-XR) 500 MG 24 hr tablet 500 mg daily.    metoprolol  succinate (TOPROL -XL) 50 MG 24 hr tablet Take 1 tablet (50 mg total) by mouth daily. TAKE WITH OR IMMEDIATELY FOLLOWING A MEAL.   Multiple Vitamins-Minerals (VITAMIN D3 COMPLETE PO) Take by mouth.   nitroGLYCERIN  (NITROSTAT ) 0.4 MG SL tablet Place 1 tablet (0.4 mg total) under the tongue every 5 (five) minutes x 3 doses as needed for chest pain.   Omega-3 Fatty Acids (FISH OIL ) 1200 MG CAPS Take 1,200 mg by mouth 2 (two) times daily. (0800 & 2000)   OZEMPIC, 0.25 OR 0.5 MG/DOSE, 2 MG/3ML SOPN Inject into the skin once a week.   pantoprazole (PROTONIX) 40 MG tablet Take 40 mg by mouth 2 (two) times daily.   PARoxetine  (PAXIL ) 20 MG tablet Take 20 mg by mouth daily. Taking with 40mg  to make 60mg  daily   PARoxetine  (PAXIL ) 40 MG tablet Take 40 mg by mouth daily. (0800) Taking with 20mg  to make 60mg  daily   Probiotic TBEC Take 1 capsule by mouth daily.   spironolactone  (ALDACTONE ) 50 MG tablet Take 1 tablet (50 mg total) by mouth daily.   sucralfate (CARAFATE) 1 g tablet Take 1 g by mouth 3 (three) times daily.   tamsulosin (FLOMAX) 0.4 MG CAPS capsule Take 0.4 mg by mouth at bedtime.   TURMERIC PO Take 1 capsule by mouth 3 (three) times daily as needed (for inflammation.).   valACYclovir (VALTREX) 500 MG tablet Take 500 mg by mouth as needed.   vitamin B-12 (CYANOCOBALAMIN ) 1000 MCG tablet Take 1,000 mcg by mouth daily. (0800)     Allergies:   Ibuprofen and Nsaids   Social History   Socioeconomic History   Marital status: Married    Spouse name: Not on file   Number of children: Not on file   Years of education: Not on file   Highest education level: Not on file  Occupational History   Not on file  Tobacco Use   Smoking status: Former    Current packs/day: 0.00    Average packs/day: 0.3 packs/day for 47.0 years (11.8 ttl pk-yrs)    Types: Cigarettes    Start date: 02/20/1972    Quit date: 07/30/2018    Years since quitting: 5.3   Smokeless tobacco: Never  Vaping Use    Vaping status: Never Used  Substance and Sexual Activity   Alcohol use: Yes    Alcohol/week: 0.0 standard drinks of alcohol    Comment: Hx of EtOH abuse, quit in 1982   Drug use: Yes    Types: Marijuana    Comment: smoked marijuana 40 years quit in ~ 2014, did a little of qthing in the 1970's for ~ 3 yrs   Sexual activity: Not Currently  Other Topics Concern   Not  on file  Social History Narrative   Lives with his wife and 64 year old son.  Normally ambulates without assistive device, drives, completes ADLs without difficulty.     Social Drivers of Corporate Investment Banker Strain: Not on file  Food Insecurity: Not on file  Transportation Needs: Not on file  Physical Activity: Not on file  Stress: Not on file  Social Connections: Not on file     Family History: The patient's family history includes Alzheimer's disease in his mother; Anxiety disorder in his mother; CAD in his father; Depression in his father; High blood pressure in his mother.  ROS:   Please see the history of present illness.     All other systems reviewed and are negative.  EKGs/Labs/Other Studies Reviewed:    The following studies were reviewed today: Cardiac cath 09/16/15: Procedures  Coronary Stent Intervention  Intravascular Pressure Wire/FFR Study  Left Heart Cath and Coronary Angiography   Conclusion  Prox Cx lesion, 20% stenosed. The left ventricular systolic function is normal. Mid RCA lesion, 60% stenosed. 1st Mrg lesion, 95% stenosed. Post intervention, there is a 0% residual stenosis.   1. Single vessel obstructive CAD involving the first OM 2. Borderline eccentric stenosis in the mid RCA with normal FFR of 0.93. 3. Normal LV function 4. Successful stenting of the first OM with a DES.   Plan: DAPT for one year. Aggressive risk factor modification. Anticipate DC in am.   Myoview  10/01/18: Study Highlights    Nuclear stress EF: 56%. Normal wall motion. There was no ST segment  deviation noted during stress. Defect 1: There is a small defect of mild severity present in the apex location. No ischemia This is a low risk study. No ischemia identified.   Oneil Parchment, MD  Echo 03/22/22: IMPRESSIONS     1. Left ventricular ejection fraction, by estimation, is 55%. The left  ventricle has normal function. The left ventricle has no regional wall  motion abnormalities. There is mild left ventricular hypertrophy. Left  ventricular diastolic parameters are  consistent with Grade I diastolic dysfunction (impaired relaxation).   2. Right ventricular systolic function is normal. The right ventricular  size is normal. Tricuspid regurgitation signal is inadequate for assessing  PA pressure.   3. The mitral valve is normal in structure. No evidence of mitral valve  regurgitation. No evidence of mitral stenosis.   4. The aortic valve is tricuspid. There is mild calcification of the  aortic valve. Aortic valve regurgitation is not visualized. Aortic valve  sclerosis/calcification is present, without any evidence of aortic  stenosis.   5. The inferior vena cava is normal in size with greater than 50%  respiratory variability, suggesting right atrial pressure of 3 mmHg   EKG:  EKG is not ordered today.  The ekg ordered today demonstrates N/A   Recent Labs: 07/02/2023: ALT 18; BUN 20; Creatinine, Ser 1.15; Hemoglobin 12.1; Platelets 253; Potassium 3.1; Sodium 137  Recent Lipid Panel    Component Value Date/Time   CHOL 206 (H) 07/02/2023 0859   TRIG 244 (H) 07/02/2023 0859   HDL 50 07/02/2023 0859   CHOLHDL 4.1 07/02/2023 0859   CHOLHDL 2.1 11/15/2015 0857   VLDL 11 11/15/2015 0857   LDLCALC 114 (H) 07/02/2023 0859  Dated 03/22/22: Hgb 11.4. otherwise CBC and CMET normal.  Dated 10/24/22: cholesterol 187, triglycerides 298, HDL 50, LDL 58. A1c 6.7%.    Risk Assessment/Calculations:  Physical Exam:    VS:  BP 110/60 (BP Location: Left Arm, Patient  Position: Sitting, Cuff Size: Normal)   Pulse 79   Ht 5' 10 (1.778 m)   Wt 218 lb (98.9 kg)   SpO2 94%   BMI 31.28 kg/m     Wt Readings from Last 3 Encounters:  12/05/23 218 lb (98.9 kg)  09/18/23 220 lb (99.8 kg)  05/27/23 229 lb 6.4 oz (104.1 kg)     GEN:  Well nourished, well developed in no acute distress HEENT: Normal NECK: No JVD; No carotid bruits LYMPHATICS: No lymphadenopathy CARDIAC: RRR, no murmurs, rubs, gallops RESPIRATORY:  Clear to auscultation without rales, wheezing or rhonchi  ABDOMEN: Soft, non-tender, non-distended MUSCULOSKELETAL:  No edema; No deformity  SKIN: Warm and dry NEUROLOGIC:  Alert and oriented x 3 PSYCHIATRIC:  Normal affect   ASSESSMENT:    1. Coronary artery disease involving native coronary artery of native heart without angina pectoris   2. Hypercholesterolemia   3. Chronic diastolic HF (heart failure) (HCC)   4. OSA (obstructive sleep apnea)     PLAN:    In order of problems listed above:  CAD with remote stenting of OM in 2016 in setting of NSTEMI. No active angina. Myoview  2019 normal. Continue ASA, metoprolol , amlodipine . Encourage lifestyle modification.  HTN.  well controlled. Continue current therapy HLD on statin. Encourage weight loss for high triglycerides. Diabetic control.  DM type 2 on Jardiance  and metformin. Now on Ozempic. Per PCP           Medication Adjustments/Labs and Tests Ordered: Current medicines are reviewed at length with the patient today.  Concerns regarding medicines are outlined above.  No orders of the defined types were placed in this encounter.  No orders of the defined types were placed in this encounter.   There are no Patient Instructions on file for this visit.   Signed, Caroleena Paolini, MD  12/05/2023 3:56 PM    [facility]

## 2023-12-05 ENCOUNTER — Encounter: Payer: Self-pay | Admitting: Cardiology

## 2023-12-05 ENCOUNTER — Ambulatory Visit: Payer: PPO | Attending: Cardiology | Admitting: Cardiology

## 2023-12-05 VITALS — BP 110/60 | HR 79 | Ht 70.0 in | Wt 218.0 lb

## 2023-12-05 DIAGNOSIS — I2510 Atherosclerotic heart disease of native coronary artery without angina pectoris: Secondary | ICD-10-CM | POA: Diagnosis not present

## 2023-12-05 DIAGNOSIS — I5032 Chronic diastolic (congestive) heart failure: Secondary | ICD-10-CM

## 2023-12-05 DIAGNOSIS — E7800 Pure hypercholesterolemia, unspecified: Secondary | ICD-10-CM | POA: Diagnosis not present

## 2023-12-05 DIAGNOSIS — G4733 Obstructive sleep apnea (adult) (pediatric): Secondary | ICD-10-CM | POA: Diagnosis not present

## 2023-12-05 NOTE — Patient Instructions (Signed)
 Medication Instructions:  Continue same medications *If you need a refill on your cardiac medications before your next appointment, please call your pharmacy*   Lab Work: None ordered   Testing/Procedures: None ordered   Follow-Up: At [facility], you and your health needs are our priority.  As part of our continuing mission to provide you with exceptional heart care, we have created designated Provider Care Teams.  These Care Teams include your primary Cardiologist (physician) and Advanced Practice Providers (APPs -  Physician Assistants and Nurse Practitioners) who all work together to provide you with the care you need, when you need it.  We recommend signing up for the patient portal called "MyChart".  Sign up information is provided on this After Visit Summary.  MyChart is used to connect with patients for Virtual Visits (Telemedicine).  Patients are able to view lab/test results, encounter notes, upcoming appointments, etc.  Non-urgent messages can be sent to your provider as well.   To learn more about what you can do with MyChart, go to [email or web address].    Your next appointment:  6 months    Call in April to schedule July appointment     Provider:  Dr.Jordan

## 2023-12-11 ENCOUNTER — Other Ambulatory Visit: Payer: Self-pay

## 2023-12-11 ENCOUNTER — Emergency Department (HOSPITAL_COMMUNITY)
Admission: EM | Admit: 2023-12-11 | Discharge: 2023-12-11 | Disposition: A | Discharge status: Home / Self Care | Payer: PPO | Attending: Emergency Medicine | Admitting: Emergency Medicine

## 2023-12-11 ENCOUNTER — Telehealth: Payer: Self-pay | Admitting: Cardiovascular Disease

## 2023-12-11 ENCOUNTER — Emergency Department (HOSPITAL_COMMUNITY): Payer: PPO

## 2023-12-11 ENCOUNTER — Encounter (HOSPITAL_COMMUNITY): Payer: Self-pay

## 2023-12-11 DIAGNOSIS — Z794 Long term (current) use of insulin: Secondary | ICD-10-CM | POA: Insufficient documentation

## 2023-12-11 DIAGNOSIS — Z7984 Long term (current) use of oral hypoglycemic drugs: Secondary | ICD-10-CM | POA: Insufficient documentation

## 2023-12-11 DIAGNOSIS — F17200 Nicotine dependence, unspecified, uncomplicated: Secondary | ICD-10-CM | POA: Insufficient documentation

## 2023-12-11 DIAGNOSIS — I5032 Chronic diastolic (congestive) heart failure: Secondary | ICD-10-CM | POA: Insufficient documentation

## 2023-12-11 DIAGNOSIS — R197 Diarrhea, unspecified: Secondary | ICD-10-CM | POA: Diagnosis not present

## 2023-12-11 DIAGNOSIS — Z79899 Other long term (current) drug therapy: Secondary | ICD-10-CM | POA: Insufficient documentation

## 2023-12-11 DIAGNOSIS — Z7982 Long term (current) use of aspirin: Secondary | ICD-10-CM | POA: Diagnosis not present

## 2023-12-11 DIAGNOSIS — I2510 Atherosclerotic heart disease of native coronary artery without angina pectoris: Secondary | ICD-10-CM | POA: Insufficient documentation

## 2023-12-11 DIAGNOSIS — I110 Hypertensive heart disease with heart failure: Secondary | ICD-10-CM | POA: Diagnosis not present

## 2023-12-11 DIAGNOSIS — K7689 Other specified diseases of liver: Secondary | ICD-10-CM | POA: Insufficient documentation

## 2023-12-11 DIAGNOSIS — E119 Type 2 diabetes mellitus without complications: Secondary | ICD-10-CM | POA: Insufficient documentation

## 2023-12-11 DIAGNOSIS — R112 Nausea with vomiting, unspecified: Secondary | ICD-10-CM | POA: Diagnosis present

## 2023-12-11 LAB — LIPASE, BLOOD: Lipase: 41 U/L (ref 11–51)

## 2023-12-11 LAB — COMPREHENSIVE METABOLIC PANEL
ALT: 20 U/L (ref 0–44)
AST: 21 U/L (ref 15–41)
Albumin: 4.6 g/dL (ref 3.5–5.0)
Alkaline Phosphatase: 65 U/L (ref 38–126)
Anion gap: 16 — ABNORMAL HIGH (ref 5–15)
BUN: 15 mg/dL (ref 8–23)
CO2: 25 mmol/L (ref 22–32)
Calcium: 10.8 mg/dL — ABNORMAL HIGH (ref 8.9–10.3)
Chloride: 95 mmol/L — ABNORMAL LOW (ref 98–111)
Creatinine, Ser: 1.12 mg/dL (ref 0.61–1.24)
GFR, Estimated: >60 mL/min (ref >60)
Glucose, Bld: 119 mg/dL — ABNORMAL HIGH (ref 70–99)
Potassium: 3.2 mmol/L — ABNORMAL LOW (ref 3.5–5.1)
Sodium: 136 mmol/L (ref 135–145)
Total Bilirubin: 1.3 mg/dL — ABNORMAL HIGH (ref 0.0–1.2)
Total Protein: 8.6 g/dL — ABNORMAL HIGH (ref 6.5–8.1)

## 2023-12-11 LAB — URINALYSIS, ROUTINE W REFLEX MICROSCOPIC
Bilirubin Urine: NEGATIVE
Glucose, UA: >=500 mg/dL — AB
Ketones, ur: NEGATIVE mg/dL
Leukocytes,Ua: NEGATIVE
Nitrite: NEGATIVE
Protein, ur: NEGATIVE mg/dL
Specific Gravity, Urine: 1.013 (ref 1.005–1.030)
pH: 5 (ref 5.0–8.0)

## 2023-12-11 LAB — CBC WITH DIFFERENTIAL/PLATELET
Abs Immature Granulocytes: 0.05 10*3/uL (ref 0.00–0.07)
Basophils Absolute: 0.1 10*3/uL (ref 0.0–0.1)
Basophils Relative: 1 %
Eosinophils Absolute: 1.6 10*3/uL — ABNORMAL HIGH (ref 0.0–0.5)
Eosinophils Relative: 19 %
HCT: 44.8 % (ref 39.0–52.0)
Hemoglobin: 15.2 g/dL (ref 13.0–17.0)
Immature Granulocytes: 1 %
Lymphocytes Relative: 27 %
Lymphs Abs: 2.2 10*3/uL (ref 0.7–4.0)
MCH: 32.8 pg (ref 26.0–34.0)
MCHC: 33.9 g/dL (ref 30.0–36.0)
MCV: 96.8 fL (ref 80.0–100.0)
Monocytes Absolute: 0.5 10*3/uL (ref 0.1–1.0)
Monocytes Relative: 6 %
Neutro Abs: 3.9 10*3/uL (ref 1.7–7.7)
Neutrophils Relative %: 46 %
Platelets: 263 10*3/uL (ref 150–400)
RBC: 4.63 MIL/uL (ref 4.22–5.81)
RDW: 12.9 % (ref 11.5–15.5)
WBC: 8.3 10*3/uL (ref 4.0–10.5)
nRBC: 0 % (ref 0.0–0.2)

## 2023-12-11 MED ORDER — ONDANSETRON 4 MG PO TBDP: 4.0000 mg | ORAL_TABLET | Freq: Three times a day (TID) | ORAL | 0 refills | Status: DC | PRN

## 2023-12-11 MED ORDER — IOHEXOL 300 MG/ML  SOLN
100.0000 mL | Freq: Once | INTRAMUSCULAR | Status: AC | PRN
  Administered 2023-12-11: 100 mL via INTRAVENOUS

## 2023-12-11 NOTE — ED Provider Triage Note (Signed)
Emergency Medicine Provider Triage Evaluation Note  Joseph Fitzpatrick , a 73 y.o. male  was evaluated in triage.  Pt complains of 4 day Hx of daily vomiting, diarrhea. Was told to come to ER by PCP for possible ileus or SBO after getting abdominal XR series. Also reports "stomach full of air after using CPAP" and that this has been happening for months. Appetite decreased. Able to tolerate fluids. Able to ambulate.   Hx of gastric ulcers.   Review of Systems  Positive: Vertigo when standing (chronic), Headaches associated w/ vomiting, abdominal pain after eating, back pain (chronic).  Negative: Fevers, chills, chest pain, dyspnea, dysuria,   Physical Exam  BP 117/72   Pulse 88   Temp 98 F (36.7 C) (Oral)   Resp 18   Ht 5\' 10"  (1.778 m)   Wt 98.9 kg   SpO2 98%   BMI 31.28 kg/m  Gen:   Awake, no distress   Resp:  Normal effort  MSK:   Moves extremities without difficulty  Other:    Medical Decision Making  Medically screening exam initiated at 1:21 PM.  Appropriate orders placed.  Joseph Fitzpatrick was informed that the remainder of the evaluation will be completed by another provider, this initial triage assessment does not replace that evaluation, and the importance of remaining in the ED until their evaluation is complete.     Lunette Stands, [place] 12/11/23 1332

## 2023-12-11 NOTE — Telephone Encounter (Signed)
 Spoke to patient about his concerns. His download stopped 10/07/2022 because he changed dme's so he will call tomorrow with the serial number so the new machine can be added to airview.

## 2023-12-11 NOTE — ED Triage Notes (Signed)
 Patient is here for evaluation of nausea, vomiting and diarrhea X 3 days. Reports that he is here for CT scan. States he had an XRAY done and PCP called and told him he needed to come to the ER for CT scan.

## 2023-12-11 NOTE — Telephone Encounter (Signed)
 Patient stated he uses a CPAP machine and has been waking up each morning with a stomach full of air.  Patient wants advice on next steps.

## 2023-12-11 NOTE — ED Provider Notes (Signed)
[place] EMERGENCY DEPARTMENT AT [facility] Provider Note   CSN: [id number] Arrival date & time: 12/11/23  1312     History  Chief Complaint  Patient presents with   Abdominal Pain    Joseph Fitzpatrick is a 73 y.o. male.  HPI     73 year old male with history of coronary artery disease, hypertension, depression, hyperlipidemia, chronic diastolic heart failure, type 2 diabetes who presents with concern for nausea, vomiting and diarrhea.  4 days of symptoms 1 bout of vomiting per day 3/4 was yellow fluid, 1 time was salmon/beans from night before Nausea waxing and waning Diarrhea 1-5 times a day--reports normal. Not new worsening diarrhea.  No black or bloody stools No fevers No sick contacts, no recent travel or antibiotics.  Hx of stomach ulcers-ongoing pain, no new abdominal pain, epigastric and right lower abd pain for years No new pain No chest pain or dyspnea  Started ozempic 5 weeks ago, 3rx ago was increased  Went to PCP, had XR concerning for possible obstruction and sent to ED Past Medical History:  Diagnosis Date   Anxiety    Borderline diabetes    a. 08/2015 HbA1c = 5.9.   BPH (benign prostatic hypertrophy)    CAD (coronary artery disease)    a. 08/2015 NSTEMI/PCI: LM nl, LAD nl, RI nl, LCX 20p, OM1 95 (2.25x32 Pomus Premier DES), RCA 41m (FFR 0.93)->Med Rx, EF 55-65%.   Depression    Essential hypertension    GERD (gastroesophageal reflux disease)    Hematuria    History of duodenal ulcer    History of hiatal hernia    "suspected; never confirmed"   History of nephrolithiasis    History of pneumonia 1993 X 1   History of substance abuse (HCC)    Hypercholesterolemia    Hypokalemia    IBS (irritable bowel syndrome)    PVCs (premature ventricular contractions)    Tobacco abuse      Home Medications Prior to Admission medications   Medication Sig Start Date End Date Taking? Authorizing Provider  ondansetron (ZOFRAN-ODT) 4 MG  disintegrating tablet Take 1 tablet (4 mg total) by mouth every 8 (eight) hours as needed for nausea or vomiting. 12/11/23  Yes Alvira Monday, MD  acetaminophen (TYLENOL) 325 MG tablet Take 2 tablets by mouth as needed.    [provider]  amLODipine (NORVASC) 5 MG tablet Take 1 tablet (5 mg total) by mouth daily. 05/27/23   [place], Peter M, MD  aspirin EC 81 MG EC tablet Take 1 tablet (81 mg total) by mouth daily. 09/17/15   Creig Hines, NP  atorvastatin (LIPITOR) 80 MG tablet Take 1 tablet (80 mg total) by mouth daily. 02/28/23   [place], Peter M, MD  Cholecalciferol (VITAMIN D3) 50 MCG (2000 UT) capsule Take 2,000 Units by mouth daily. 03/23/22   [provider]  clonazePAM (KLONOPIN) 1 MG tablet Take 1 mg by mouth 3 (three) times daily as needed for anxiety.    [provider]  empagliflozin (JARDIANCE) 10 MG TABS tablet Take 1 tablet (10 mg total) by mouth daily before breakfast. 04/01/23   [place], Peter M, MD  hydrOXYzine (ATARAX) 25 MG tablet Take 25 mg by mouth daily. 08/10/23   [provider]  hydrOXYzine (VISTARIL) 25 MG capsule Take 25-75 mg by mouth 3 (three) times daily. Take 1 capsule (25mg ) at 0800,2 capsules (50mg ) at 1400, and 3 capsules (75 mg) at 2000. 01/02/14   [provider]  Loratadine 10 MG CAPS Take 10 mg by mouth daily as needed (for allergies.).     [provider]  losartan-hydrochlorothiazide (HYZAAR) 100-25 MG tablet Take 1 tablet by mouth daily. 05/27/23   [place], Peter M, MD  metFORMIN (GLUCOPHAGE-XR) 500 MG 24 hr tablet 500 mg daily. 01/25/22   [provider]  metoprolol succinate (TOPROL-XL) 50 MG 24 hr tablet Take 1 tablet (50 mg total) by mouth daily. TAKE WITH OR IMMEDIATELY FOLLOWING A MEAL. 05/27/23   [place], Peter M, MD  Multiple Vitamins-Minerals (VITAMIN D3 COMPLETE PO) Take by mouth.    [provider]  nitroGLYCERIN (NITROSTAT) 0.4 MG SL tablet Place 1 tablet (0.4 mg total) under the  tongue every 5 (five) minutes x 3 doses as needed for chest pain. 09/29/18   Rosalio Macadamia, NP  Omega-3 Fatty Acids (FISH OIL) 1200 MG CAPS Take 1,200 mg by mouth 2 (two) times daily. (0800 & 2000)    [provider]  OZEMPIC, 0.25 OR 0.5 MG/DOSE, 2 MG/3ML SOPN Inject into the skin once a week.    [provider]  pantoprazole (PROTONIX) 40 MG tablet Take 40 mg by mouth 2 (two) times daily. 02/28/22   [provider]  PARoxetine (PAXIL) 20 MG tablet Take 20 mg by mouth daily. Taking with 40mg  to make 60mg  daily    [provider]  PARoxetine (PAXIL) 40 MG tablet Take 40 mg by mouth daily. (0800) Taking with 20mg  to make 60mg  daily 08/28/15   [provider]  Probiotic TBEC Take 1 capsule by mouth daily.    [provider]  spironolactone (ALDACTONE) 50 MG tablet Take 1 tablet (50 mg total) by mouth daily. 07/18/23   [place], Peter M, MD  sucralfate (CARAFATE) 1 g tablet Take 1 g by mouth 3 (three) times daily. 09/18/21   [provider]  tamsulosin (FLOMAX) 0.4 MG CAPS capsule Take 0.4 mg by mouth at bedtime. 02/01/22   [provider]  TURMERIC PO Take 1 capsule by mouth 3 (three) times daily as needed (for inflammation.).    [provider]  valACYclovir (VALTREX) 500 MG tablet Take 500 mg by mouth as needed.    [provider]  vitamin B-12 (CYANOCOBALAMIN) 1000 MCG tablet Take 1,000 mcg by mouth daily. (0800)    [provider]      Allergies    Ibuprofen and Nsaids    Review of Systems   Review of Systems  Physical Exam Updated Vital Signs BP 122/68   Pulse 70   Temp 98 F (36.7 C)   Resp 19   Ht 5\' 10"  (1.778 m)   Wt 98.9 kg   SpO2 97%   BMI 31.28 kg/m  Physical Exam Vitals and nursing note reviewed.  Constitutional:      General: He is not in acute distress.    Appearance: He is well-developed. He is not diaphoretic.  HENT:     Head: Normocephalic and atraumatic.  Eyes:      Conjunctiva/sclera: Conjunctivae normal.  Cardiovascular:     Rate and Rhythm: Normal rate and regular rhythm.  Pulmonary:     Effort: Pulmonary effort is normal. No respiratory distress.  Abdominal:     General: There is no distension.     Palpations: Abdomen is soft.     Tenderness: There is abdominal tenderness (mild diffuse). There is no guarding.  Musculoskeletal:     Cervical back: Normal range of motion.  Skin:  General: Skin is warm and dry.  Neurological:     Mental Status: He is alert and oriented to person, place, and time.     ED Results / Procedures / Treatments   Labs (all labs ordered are listed, but only abnormal results are displayed) Labs Reviewed  CBC WITH DIFFERENTIAL/PLATELET - Abnormal; Notable for the following components:      Result Value   Eosinophils Absolute 1.6 (*)    All other components within normal limits  COMPREHENSIVE METABOLIC PANEL - Abnormal; Notable for the following components:   Potassium 3.2 (*)    Chloride 95 (*)    Glucose, Bld 119 (*)    Calcium 10.8 (*)    Total Protein 8.6 (*)    Total Bilirubin 1.3 (*)    Anion gap 16 (*)    All other components within normal limits  URINALYSIS, ROUTINE W REFLEX MICROSCOPIC - Abnormal; Notable for the following components:   Color, Urine STRAW (*)    Glucose, UA >=500 (*)    Hgb urine dipstick SMALL (*)    Bacteria, UA RARE (*)    All other components within normal limits  LIPASE, BLOOD    EKG None  Radiology CT ABDOMEN PELVIS W CONTRAST Result Date: 12/11/2023 CLINICAL DATA:  Four day history of vomiting and diarrhea. EXAM: CT ABDOMEN AND PELVIS WITH CONTRAST TECHNIQUE: Multidetector CT imaging of the abdomen and pelvis was performed using the standard protocol following bolus administration of intravenous contrast. RADIATION DOSE REDUCTION: This exam was performed according to the departmental dose-optimization program which includes automated exposure control, adjustment of the mA  and/or kV according to patient size and/or use of iterative reconstruction technique. CONTRAST:  OMNIPAQUE IOHEXOL 300 MG/ML  SOLN COMPARISON:  CT 12/09/2020. FINDINGS: Lower chest: Breathing motion along bases. There is some minimal linear changes and subtle ground-glass at the bases, nonspecific. No pleural effusion. Hepatobiliary: Fatty liver infiltration with some nodular contours. Patent portal vein. Previous cholecystectomy. Pancreas: Unremarkable. No pancreatic ductal dilatation or surrounding inflammatory changes. Spleen: Normal in size without focal abnormality. Adrenals/Urinary Tract: Adrenal glands are preserved. No enhancing renal mass or collecting system dilatation. The ureters have normal course and caliber extending down to the bladder. 9 mm low-attenuation left-sided renal lesions identified, unchanged from previous and consistent with a benign cystic lesion. No specific imaging follow-up. The ureters have normal course and caliber extending down to the urinary bladder. Preserved contours of the urinary bladder. Stomach/Bowel: On this non oral contrast exam large bowel has a normal course and caliber. Scattered colonic stool. There is fluid and debris in the mildly distended stomach. Small bowel is nondilated. Normal appendix seen posterior to the cecum in the right hemipelvis. Vascular/Lymphatic: Extensive vascular calcifications along the aorta and branch vessels. Potential areas of significant stenosis seen along the renal arteries. Please correlate for any symptoms. Potential significant stenosis along the iliac vessels. Normal caliber IVC. No specific abnormal lymph node enlargement present in the abdomen and pelvis. Reproductive: Prostate is unremarkable. Other: No free air or free fluid. Musculoskeletal: Degenerative changes along the spine and pelvis. IMPRESSION: No bowel obstruction, free air or free fluid. Scattered stool. Normal appendix. Fatty liver infiltration. Diffuse vascular  calcifications identified with potential areas of stenosis along the renal vessels and iliac vessels. Please correlate with any particular symptoms. Electronically Signed   By: Karen Kays M.D.   On: 12/11/2023 18:23    Procedures Procedures    Medications Ordered in ED Medications  iohexol (OMNIPAQUE) 300  MG/ML solution 100 mL (100 mLs Intravenous Contrast Given 12/11/23 1711)    ED Course/ Medical Decision Making/ A&P                                  73 year old male with history of coronary artery disease, hypertension, depression, hyperlipidemia, chronic diastolic heart failure, type 2 diabetes who presents with concern for nausea, vomiting and XR concerning for possible obstruction with PCP.  DDx includes appendicitis, pancreatitis, cholecystitis, pyelonephritis, nephrolithiasis, diverticulitis, mesenteric ischemia, SBO, gastroenteritis, gastritis, PUD, AAA, ozempic side effects.  Lower suspicion for ACS given associate other symptoms, no chest pain or dyspnea, no exertional symptoms.   Labs completed and personally evaluated and interpreted by me show no anemia, no leukocytosis, mild hypokalemia, mild hypercalcemia, very mild bilirubin elevation, AG without acidosis. No pancreatitis or hepatitis. Urinalysis without signs of urinary tract infection.   CT abdomen pelvis was performed which shows no bowel obstruction, infectious findings, does show fatty liver and diffuse vascular calcifications with potential areas of stenosis along the renal and iliac vessels.  Has normal bilateral lower extremity pulses, renal function and blood pressures normal and does not appear to have symptoms related to these findings at this time.  No sign of bowel inflammation on CT< do not suspect mesenteric ischemia.  Possible symptoms related to ozempic, also possible that could be viral illness. Has rx for pantoprazole. Given rx for zofran for nausea and recommend continued supportive care and PCP follow  up. Patient discharged in stable condition with understanding of reasons to return.         Final Clinical Impression(s) / ED Diagnoses Final diagnoses:  Nausea and vomiting, unspecified vomiting type  Diarrhea, unspecified type    Rx / DC Orders ED Discharge Orders          Ordered    ondansetron (ZOFRAN-ODT) 4 MG disintegrating tablet  Every 8 hours PRN        12/11/23 2001              Alvira Monday, MD 12/12/23 [phone]

## 2024-01-27 DIAGNOSIS — E1159 Type 2 diabetes mellitus with other circulatory complications: Secondary | ICD-10-CM | POA: Diagnosis not present

## 2024-01-27 DIAGNOSIS — F411 Generalized anxiety disorder: Secondary | ICD-10-CM | POA: Diagnosis not present

## 2024-01-27 DIAGNOSIS — G4733 Obstructive sleep apnea (adult) (pediatric): Secondary | ICD-10-CM | POA: Diagnosis not present

## 2024-01-27 DIAGNOSIS — R140 Abdominal distension (gaseous): Secondary | ICD-10-CM | POA: Diagnosis not present

## 2024-01-27 DIAGNOSIS — I2510 Atherosclerotic heart disease of native coronary artery without angina pectoris: Secondary | ICD-10-CM | POA: Diagnosis not present

## 2024-01-27 DIAGNOSIS — I10 Essential (primary) hypertension: Secondary | ICD-10-CM | POA: Diagnosis not present

## 2024-02-03 DIAGNOSIS — W010XXA Fall on same level from slipping, tripping and stumbling without subsequent striking against object, initial encounter: Secondary | ICD-10-CM | POA: Diagnosis not present

## 2024-02-03 DIAGNOSIS — S3992XA Unspecified injury of lower back, initial encounter: Secondary | ICD-10-CM | POA: Diagnosis not present

## 2024-02-07 DIAGNOSIS — S32020D Wedge compression fracture of second lumbar vertebra, subsequent encounter for fracture with routine healing: Secondary | ICD-10-CM | POA: Diagnosis not present

## 2024-02-07 DIAGNOSIS — M545 Low back pain, unspecified: Secondary | ICD-10-CM | POA: Diagnosis not present

## 2024-02-07 DIAGNOSIS — J189 Pneumonia, unspecified organism: Secondary | ICD-10-CM | POA: Diagnosis not present

## 2024-02-07 DIAGNOSIS — Z6830 Body mass index (BMI) 30.0-30.9, adult: Secondary | ICD-10-CM | POA: Diagnosis not present

## 2024-02-10 ENCOUNTER — Other Ambulatory Visit: Payer: Self-pay | Admitting: Family Medicine

## 2024-02-10 DIAGNOSIS — S32020D Wedge compression fracture of second lumbar vertebra, subsequent encounter for fracture with routine healing: Secondary | ICD-10-CM

## 2024-02-14 ENCOUNTER — Other Ambulatory Visit: Payer: Self-pay | Admitting: Family Medicine

## 2024-02-14 ENCOUNTER — Ambulatory Visit
Admission: RE | Admit: 2024-02-14 | Discharge: 2024-02-14 | Disposition: A | Discharge status: Home / Self Care | Source: Ambulatory Visit | Attending: Family Medicine | Admitting: Family Medicine

## 2024-02-14 DIAGNOSIS — S32020D Wedge compression fracture of second lumbar vertebra, subsequent encounter for fracture with routine healing: Secondary | ICD-10-CM

## 2024-02-14 DIAGNOSIS — Z1889 Other specified retained foreign body fragments: Secondary | ICD-10-CM | POA: Diagnosis not present

## 2024-02-14 DIAGNOSIS — Z0189 Encounter for other specified special examinations: Secondary | ICD-10-CM | POA: Diagnosis not present

## 2024-02-18 ENCOUNTER — Ambulatory Visit
Admission: RE | Admit: 2024-02-18 | Discharge: 2024-02-18 | Discharge status: Home / Self Care | Source: Ambulatory Visit | Attending: Family Medicine | Admitting: Family Medicine

## 2024-02-18 DIAGNOSIS — S32020D Wedge compression fracture of second lumbar vertebra, subsequent encounter for fracture with routine healing: Secondary | ICD-10-CM

## 2024-02-18 DIAGNOSIS — M47816 Spondylosis without myelopathy or radiculopathy, lumbar region: Secondary | ICD-10-CM | POA: Diagnosis not present

## 2024-02-18 DIAGNOSIS — M4856XA Collapsed vertebra, not elsewhere classified, lumbar region, initial encounter for fracture: Secondary | ICD-10-CM | POA: Diagnosis not present

## 2024-03-02 DIAGNOSIS — S32020A Wedge compression fracture of second lumbar vertebra, initial encounter for closed fracture: Secondary | ICD-10-CM | POA: Diagnosis not present

## 2024-03-30 DIAGNOSIS — S32020A Wedge compression fracture of second lumbar vertebra, initial encounter for closed fracture: Secondary | ICD-10-CM | POA: Diagnosis not present

## 2024-03-30 DIAGNOSIS — Z6829 Body mass index (BMI) 29.0-29.9, adult: Secondary | ICD-10-CM | POA: Diagnosis not present

## 2024-04-28 DIAGNOSIS — E1159 Type 2 diabetes mellitus with other circulatory complications: Secondary | ICD-10-CM | POA: Diagnosis not present

## 2024-04-28 DIAGNOSIS — K2930 Chronic superficial gastritis without bleeding: Secondary | ICD-10-CM | POA: Diagnosis not present

## 2024-04-28 DIAGNOSIS — I10 Essential (primary) hypertension: Secondary | ICD-10-CM | POA: Diagnosis not present

## 2024-04-28 DIAGNOSIS — Z6830 Body mass index (BMI) 30.0-30.9, adult: Secondary | ICD-10-CM | POA: Diagnosis not present

## 2024-05-28 DIAGNOSIS — F39 Unspecified mood [affective] disorder: Secondary | ICD-10-CM | POA: Diagnosis not present

## 2024-05-31 NOTE — Progress Notes (Unsigned)
 Cardiology Office Note:    Date:  06/02/2024   ID:  VAN SEYMORE, DOB [birth year], MRN [id number]  PCP:  Claudene Lacks, MD   [facility] Providers Cardiologist:  None     Referring MD: Dayna Motto, DO   Chief Complaint  Patient presents with   Coronary Artery Disease    History of Present Illness:    Joseph Fitzpatrick is a 73 y.o. male is seen for follow up CAD. Former patient of Dr Victory Claudene. He was admitted for  NSTEMI in 2016 with OM1 felt to be the culprit vessel and this was successfully treated with a 2.25 x 32 mm Promus Premier drug-eluting stent.  Fractional flow reserve was measured across the RCA lesion and was normal at 0.93. His other issues include HTN, depression, tobacco abuse and HLD. He did have an abdominal aortogram in 06/2017 for claudication - found to have minimal disease in the aortoiliac arterial system and 50% stenosis in distal superfical femoral artery.  Followed by Dr Burnard for OSA. Myoview  in 2019 was low risk. Echo in April 2023 looked good.   On follow up today he states he fell in March with a vertebral fracture. Managed conservatively. Some atypical chest pain right sided. Having some GI issues and planning to have upper and lower endoscopy. Wasn't taking lipitor  regularly when last blood drawn. Does note with CPAP he is awakening with a belly full of air.  Past Medical History:  Diagnosis Date   Anxiety    Borderline diabetes    a. 08/2015 HbA1c = 5.9.   BPH (benign prostatic hypertrophy)    CAD (coronary artery disease)    a. 08/2015 NSTEMI/PCI: LM nl, LAD nl, RI nl, LCX 20p, OM1 95 (2.25x32 Pomus Premier DES), RCA 70m (FFR 0.93)->Med Rx, EF 55-65%.   Depression    Essential hypertension    GERD (gastroesophageal reflux disease)    Hematuria    History of duodenal ulcer    History of hiatal hernia    suspected; never confirmed   History of nephrolithiasis    History of pneumonia 1993 X 1   History of substance abuse (HCC)     Hypercholesterolemia    Hypokalemia    IBS (irritable bowel syndrome)    PVCs (premature ventricular contractions)    Tobacco abuse     Past Surgical History:  Procedure Laterality Date   ABDOMINAL AORTOGRAM W/LOWER EXTREMITY N/A 07/18/2017   Procedure: ABDOMINAL AORTOGRAM W/LOWER EXTREMITY;  Surgeon: Laurence Redell CROME, MD;  Location[facility] INVASIVE CV LAB;  Service: Cardiovascular;  Laterality: N/A;   CARDIAC CATHETERIZATION N/A 09/16/2015   Procedure: Left Heart Cath and Coronary Angiography;  Surgeon: Tecumseh Yeagley M [place], MD;  Location: [facility] INVASIVE CV LAB;  Service: Cardiovascular;  Laterality: N/A;   CARDIAC CATHETERIZATION  09/16/2015   Procedure: Intravascular Pressure Wire/FFR Study;  Surgeon: Delva Derden M [place], MD;  Location[facility] INVASIVE CV LAB;  Service: Cardiovascular;;   CARDIAC CATHETERIZATION  09/16/2015   Procedure: Coronary Stent Intervention;  Surgeon: Lory Nowaczyk M [place], MD;  Location: [facility] INVASIVE CV LAB;  Service: Cardiovascular;;   CHOLECYSTECTOMY OPEN     TONSILLECTOMY      Current Medications: Current Meds  Medication Sig   amLODipine  (NORVASC ) 5 MG tablet Take 1 tablet (5 mg total) by mouth daily.   aspirin  EC 81 MG EC tablet Take 1 tablet (81 mg total) by mouth daily.   atorvastatin  (LIPITOR ) 80 MG tablet Take 1 tablet (80 mg total) by mouth  daily.   calcitonin, salmon, (MIACALCIN/FORTICAL) 200 UNIT/ACT nasal spray Place 1 spray into alternate nostrils daily.   Cholecalciferol (VITAMIN D3) 50 MCG (2000 UT) capsule Take 2,000 Units by mouth daily.   clonazePAM  (KLONOPIN ) 1 MG tablet Take 1 mg by mouth 3 (three) times daily as needed for anxiety. (Patient taking differently: Take 1 mg by mouth 2 (two) times daily as needed for anxiety.)   empagliflozin  (JARDIANCE ) 10 MG TABS tablet Take 1 tablet (10 mg total) by mouth daily before breakfast.   hydrOXYzine  (VISTARIL ) 25 MG capsule Take 25-75 mg by mouth 3 (three) times daily. Take 1 capsule (25mg ) at 0800,2 capsules (50mg ) at 1400,  and 3 capsules (75 mg) at 2000.   losartan -hydrochlorothiazide  (HYZAAR) 100-25 MG tablet Take 1 tablet by mouth daily.   metFORMIN (GLUCOPHAGE-XR) 500 MG 24 hr tablet 500 mg daily.   metoprolol  succinate (TOPROL -XL) 50 MG 24 hr tablet Take 1 tablet (50 mg total) by mouth daily. TAKE WITH OR IMMEDIATELY FOLLOWING A MEAL.   Multiple Vitamins-Minerals (VITAMIN D3 COMPLETE PO) Take by mouth.   Omega-3 Fatty Acids (FISH OIL ) 1200 MG CAPS Take 1,200 mg by mouth 2 (two) times daily. (0800 & 2000)   OZEMPIC, 0.25 OR 0.5 MG/DOSE, 2 MG/3ML SOPN Inject into the skin once a week.   pantoprazole (PROTONIX) 40 MG tablet Take 40 mg by mouth 2 (two) times daily.   PARoxetine  (PAXIL ) 20 MG tablet Take 20 mg by mouth daily. Taking with 40mg  to make 60mg  daily   PARoxetine  (PAXIL ) 40 MG tablet Take 40 mg by mouth daily. (0800) Taking with 20mg  to make 60mg  daily   spironolactone  (ALDACTONE ) 50 MG tablet Take 1 tablet (50 mg total) by mouth daily.   sucralfate (CARAFATE) 1 g tablet Take 1 g by mouth 3 (three) times daily.   tamsulosin (FLOMAX) 0.4 MG CAPS capsule Take 0.4 mg by mouth at bedtime.   TURMERIC PO Take 1 capsule by mouth 3 (three) times daily as needed (for inflammation.).   vitamin B-12 (CYANOCOBALAMIN ) 1000 MCG tablet Take 1,000 mcg by mouth daily. (0800)     Allergies:   Ibuprofen and Nsaids   Social History   Socioeconomic History   Marital status: Married    Spouse name: Not on file   Number of children: Not on file   Years of education: Not on file   Highest education level: Not on file  Occupational History   Not on file  Tobacco Use   Smoking status: Former    Current packs/day: 0.00    Average packs/day: 0.3 packs/day for 47.0 years (11.8 ttl pk-yrs)    Types: Cigarettes    Start date: 02/20/1972    Quit date: 07/30/2018    Years since quitting: 5.8   Smokeless tobacco: Never  Vaping Use   Vaping status: Never Used  Substance and Sexual Activity   Alcohol use: Yes     Alcohol/week: 0.0 standard drinks of alcohol    Comment: Hx of EtOH abuse, quit in 1982   Drug use: Yes    Types: Marijuana    Comment: smoked marijuana 40 years quit in ~ 2014, did a little of qthing in the 1970's for ~ 3 yrs   Sexual activity: Not Currently  Other Topics Concern   Not on file  Social History Narrative   Lives with his wife and 2 year old son.  Normally ambulates without assistive device, drives, completes ADLs without difficulty.     Social Drivers of Health  Financial Resource Strain: Not on file  Food Insecurity: Not on file  Transportation Needs: Not on file  Physical Activity: Not on file  Stress: Not on file  Social Connections: Not on file     Family History: The patient's family history includes Alzheimer's disease in his mother; Anxiety disorder in his mother; CAD in his father; Depression in his father; High blood pressure in his mother.  ROS:   Please see the history of present illness.     All other systems reviewed and are negative.  EKGs/Labs/Other Studies Reviewed:    The following studies were reviewed today: Cardiac cath 09/16/15: Procedures  Coronary Stent Intervention  Intravascular Pressure Wire/FFR Study  Left Heart Cath and Coronary Angiography   Conclusion  Prox Cx lesion, 20% stenosed. The left ventricular systolic function is normal. Mid RCA lesion, 60% stenosed. 1st Mrg lesion, 95% stenosed. Post intervention, there is a 0% residual stenosis.   1. Single vessel obstructive CAD involving the first OM 2. Borderline eccentric stenosis in the mid RCA with normal FFR of 0.93. 3. Normal LV function 4. Successful stenting of the first OM with a DES.   Plan: DAPT for one year. Aggressive risk factor modification. Anticipate DC in am.   Myoview  10/01/18: Study Highlights    Nuclear stress EF: 56%. Normal wall motion. There was no ST segment deviation noted during stress. Defect 1: There is a small defect of mild severity  present in the apex location. No ischemia This is a low risk study. No ischemia identified.   Oneil Parchment, MD  Echo 03/22/22: IMPRESSIONS     1. Left ventricular ejection fraction, by estimation, is 55%. The left  ventricle has normal function. The left ventricle has no regional wall  motion abnormalities. There is mild left ventricular hypertrophy. Left  ventricular diastolic parameters are  consistent with Grade I diastolic dysfunction (impaired relaxation).   2. Right ventricular systolic function is normal. The right ventricular  size is normal. Tricuspid regurgitation signal is inadequate for assessing  PA pressure.   3. The mitral valve is normal in structure. No evidence of mitral valve  regurgitation. No evidence of mitral stenosis.   4. The aortic valve is tricuspid. There is mild calcification of the  aortic valve. Aortic valve regurgitation is not visualized. Aortic valve  sclerosis/calcification is present, without any evidence of aortic  stenosis.   5. The inferior vena cava is normal in size with greater than 50%  respiratory variability, suggesting right atrial pressure of 3 mmHg   EKG:  EKG is not ordered today.     Recent Labs: 12/11/2023: ALT 20; BUN 15; Creatinine, Ser 1.12; Hemoglobin 15.2; Platelets 263; Potassium 3.2; Sodium 136  Recent Lipid Panel    Component Value Date/Time   CHOL 206 (H) 07/02/2023 0859   TRIG 244 (H) 07/02/2023 0859   HDL 50 07/02/2023 0859   CHOLHDL 4.1 07/02/2023 0859   CHOLHDL 2.1 11/15/2015 0857   VLDL 11 11/15/2015 0857   LDLCALC 114 (H) 07/02/2023 0859  Dated 03/22/22: Hgb 11.4. otherwise CBC and CMET normal.  Dated 10/24/22: cholesterol 187, triglycerides 298, HDL 50, LDL 58. A1c 6.7%.  Dated 01/27/24: cholesterol 229, triglycerides 232, HDL 54, LDL 134. A1c 6.1%. CMET normal.    Risk Assessment/Calculations:                Physical Exam:    VS:  BP 135/70   Pulse 80   Ht 5' 10 (1.778 m)   Wt  205 lb 12.8 oz (93.4  kg)   SpO2 98%   BMI 29.53 kg/m     Wt Readings from Last 3 Encounters:  06/02/24 205 lb 12.8 oz (93.4 kg)  12/11/23 218 lb 0.6 oz (98.9 kg)  12/05/23 218 lb (98.9 kg)     GEN:  Well nourished, well developed in no acute distress HEENT: Normal NECK: No JVD; No carotid bruits LYMPHATICS: No lymphadenopathy CARDIAC: RRR, no murmurs, rubs, gallops RESPIRATORY:  Clear to auscultation without rales, wheezing or rhonchi  ABDOMEN: Soft, non-tender, non-distended MUSCULOSKELETAL:  No edema; No deformity  SKIN: Warm and dry NEUROLOGIC:  Alert and oriented x 3 PSYCHIATRIC:  Normal affect   ASSESSMENT:    1. Coronary artery disease involving native coronary artery of native heart without angina pectoris   2. OSA (obstructive sleep apnea)   3. Hypercholesterolemia   4. Essential hypertension      PLAN:    In order of problems listed above:  CAD with remote stenting of OM in 2016 in setting of NSTEMI. No active angina. Myoview  2019 normal. Continue ASA, metoprolol , amlodipine . Encourage lifestyle modification.  HTN.  well controlled. Continue current therapy HLD LDL increased to 134 on last lab. I think this reflects lack of compliance with statin therapy. Needs to take dailyi.  DM type 2 on Jardiance  and metformin. Now on Ozempic. Per PCP. A1c 6.1%.       Follow up in one year      Medication Adjustments/Labs and Tests Ordered: Current medicines are reviewed at length with the patient today.  Concerns regarding medicines are outlined above.  No orders of the defined types were placed in this encounter.  No orders of the defined types were placed in this encounter.   There are no Patient Instructions on file for this visit.   Signed, Kyzer Blowe [place], MD  06/02/2024 2:09 PM    [facility]

## 2024-06-02 ENCOUNTER — Ambulatory Visit: Attending: Cardiology | Admitting: Cardiology

## 2024-06-02 ENCOUNTER — Encounter: Payer: Self-pay | Admitting: Cardiology

## 2024-06-02 VITALS — BP 135/70 | HR 80 | Ht 70.0 in | Wt 205.8 lb

## 2024-06-02 DIAGNOSIS — I2510 Atherosclerotic heart disease of native coronary artery without angina pectoris: Secondary | ICD-10-CM

## 2024-06-02 DIAGNOSIS — E7800 Pure hypercholesterolemia, unspecified: Secondary | ICD-10-CM

## 2024-06-02 DIAGNOSIS — G4733 Obstructive sleep apnea (adult) (pediatric): Secondary | ICD-10-CM

## 2024-06-02 DIAGNOSIS — I10 Essential (primary) hypertension: Secondary | ICD-10-CM

## 2024-06-02 NOTE — Patient Instructions (Signed)
 Medication Instructions:  Take Lipitor  80 mg everyday Continue all other medications *If you need a refill on your cardiac medications before your next appointment, please call your pharmacy*  Lab Work: None ordered  Testing/Procedures: None ordered  Follow-Up: At [facility], you and your health needs are our priority.  As part of our continuing mission to provide you with exceptional heart care, our providers are all part of one team.  This team includes your primary Cardiologist (physician) and Advanced Practice Providers or APPs (Physician Assistants and Nurse Practitioners) who all work together to provide you with the care you need, when you need it.  Your next appointment:  1 year    Call in March to schedule July appointment     Provider:  Dr.Jordan    Schedule appointment with Dr.Turner in [facility]   We recommend signing up for the patient portal called MyChart.  Sign up information is provided on this After Visit Summary.  MyChart is used to connect with patients for Virtual Visits (Telemedicine).  Patients are able to view lab/test results, encounter notes, upcoming appointments, etc.  Non-urgent messages can be sent to your provider as well.   To learn more about what you can do with MyChart, go to [email or web address].

## 2024-06-12 DIAGNOSIS — G4733 Obstructive sleep apnea (adult) (pediatric): Secondary | ICD-10-CM | POA: Diagnosis not present

## 2024-06-14 DIAGNOSIS — S50861A Insect bite (nonvenomous) of right forearm, initial encounter: Secondary | ICD-10-CM | POA: Diagnosis not present

## 2024-06-14 DIAGNOSIS — R5381 Other malaise: Secondary | ICD-10-CM | POA: Diagnosis not present

## 2024-06-14 DIAGNOSIS — R051 Acute cough: Secondary | ICD-10-CM | POA: Diagnosis not present

## 2024-06-14 DIAGNOSIS — W57XXXA Bitten or stung by nonvenomous insect and other nonvenomous arthropods, initial encounter: Secondary | ICD-10-CM | POA: Diagnosis not present

## 2024-06-14 DIAGNOSIS — R112 Nausea with vomiting, unspecified: Secondary | ICD-10-CM | POA: Diagnosis not present

## 2024-06-14 DIAGNOSIS — Z03818 Encounter for observation for suspected exposure to other biological agents ruled out: Secondary | ICD-10-CM | POA: Diagnosis not present

## 2024-06-16 DIAGNOSIS — R051 Acute cough: Secondary | ICD-10-CM | POA: Diagnosis not present

## 2024-06-16 DIAGNOSIS — R5381 Other malaise: Secondary | ICD-10-CM | POA: Diagnosis not present

## 2024-06-18 DIAGNOSIS — Z6828 Body mass index (BMI) 28.0-28.9, adult: Secondary | ICD-10-CM | POA: Diagnosis not present

## 2024-06-18 DIAGNOSIS — D729 Disorder of white blood cells, unspecified: Secondary | ICD-10-CM | POA: Diagnosis not present

## 2024-06-18 DIAGNOSIS — R519 Headache, unspecified: Secondary | ICD-10-CM | POA: Diagnosis not present

## 2024-06-24 DIAGNOSIS — D72829 Elevated white blood cell count, unspecified: Secondary | ICD-10-CM | POA: Diagnosis not present

## 2024-06-24 DIAGNOSIS — Z6827 Body mass index (BMI) 27.0-27.9, adult: Secondary | ICD-10-CM | POA: Diagnosis not present

## 2024-07-06 DIAGNOSIS — S32020A Wedge compression fracture of second lumbar vertebra, initial encounter for closed fracture: Secondary | ICD-10-CM | POA: Diagnosis not present

## 2024-07-06 DIAGNOSIS — Z6828 Body mass index (BMI) 28.0-28.9, adult: Secondary | ICD-10-CM | POA: Diagnosis not present

## 2024-07-06 DIAGNOSIS — M47816 Spondylosis without myelopathy or radiculopathy, lumbar region: Secondary | ICD-10-CM | POA: Diagnosis not present

## 2024-07-21 ENCOUNTER — Ambulatory Visit: Admitting: Cardiology

## 2024-07-22 ENCOUNTER — Other Ambulatory Visit: Payer: Self-pay | Admitting: Cardiology

## 2024-08-19 DIAGNOSIS — F39 Unspecified mood [affective] disorder: Secondary | ICD-10-CM | POA: Diagnosis not present

## 2024-09-16 ENCOUNTER — Telehealth: Payer: Self-pay | Admitting: Pharmacist

## 2024-09-16 NOTE — Progress Notes (Signed)
   09/16/2024  Patient ID: Joseph Fitzpatrick, male   DOB: [birth year], 73 y.o.   MRN: [id number]  Patient appeared on med adherence report for [facility]. Said last picked up on 9/26 for 28DS at 2.5mg  dose.   Called and spoke with the patient on the phone today. Believes he has 1-2 doses of Mounjaro left as he did NOT start it on the day he got it. Injects on Sundays.   Asked if he would prefer to wait until the 11/4 visit with Dr. Earlyne or go ahead and increase to have script picked up in advance. Prefers to send now.  Says no side effects of nausea. Doing really well. Did report he already has a bad gut so it would be hard to tell if it were from the medication or not anyways.'   Refill request sent to Dr. Dayna.    Aloysius Lewis, PharmD, [facility] Phone Number: [phone]

## 2024-09-29 DIAGNOSIS — E1159 Type 2 diabetes mellitus with other circulatory complications: Secondary | ICD-10-CM | POA: Diagnosis not present

## 2024-09-29 DIAGNOSIS — I10 Essential (primary) hypertension: Secondary | ICD-10-CM | POA: Diagnosis not present

## 2024-09-29 DIAGNOSIS — I2510 Atherosclerotic heart disease of native coronary artery without angina pectoris: Secondary | ICD-10-CM | POA: Diagnosis not present

## 2024-09-29 DIAGNOSIS — K2930 Chronic superficial gastritis without bleeding: Secondary | ICD-10-CM | POA: Diagnosis not present

## 2024-10-06 DIAGNOSIS — I10 Essential (primary) hypertension: Secondary | ICD-10-CM | POA: Diagnosis not present

## 2024-10-16 ENCOUNTER — Telehealth: Payer: Self-pay

## 2024-10-19 ENCOUNTER — Ambulatory Visit: Attending: Cardiology | Admitting: Cardiology
